# Patient Record
Sex: Female | Born: 1935 | Race: White | Hispanic: No | Smoking: Never smoker
Health system: Southern US, Community
[De-identification: ages and names within clinical notes are randomized; demographics above are authoritative.]

## PROBLEM LIST (undated history)

## (undated) DIAGNOSIS — I4891 Unspecified atrial fibrillation: Secondary | ICD-10-CM

## (undated) DIAGNOSIS — F419 Anxiety disorder, unspecified: Secondary | ICD-10-CM

## (undated) DIAGNOSIS — F32A Depression, unspecified: Secondary | ICD-10-CM

## (undated) DIAGNOSIS — I10 Essential (primary) hypertension: Secondary | ICD-10-CM

## (undated) DIAGNOSIS — C801 Malignant (primary) neoplasm, unspecified: Secondary | ICD-10-CM

## (undated) DIAGNOSIS — F329 Major depressive disorder, single episode, unspecified: Secondary | ICD-10-CM

## (undated) HISTORY — DX: Unspecified atrial fibrillation: I48.91

## (undated) HISTORY — PX: ABDOMINAL HYSTERECTOMY: SHX81

## (undated) HISTORY — PX: MITRAL VALVE REPLACEMENT: SHX147

## (undated) HISTORY — DX: Major depressive disorder, single episode, unspecified: F32.9

## (undated) HISTORY — DX: Depression, unspecified: F32.A

---

## 2004-11-12 ENCOUNTER — Ambulatory Visit: Payer: Self-pay | Admitting: Internal Medicine

## 2005-12-09 ENCOUNTER — Ambulatory Visit: Payer: Self-pay | Admitting: Internal Medicine

## 2006-12-17 ENCOUNTER — Ambulatory Visit: Payer: Self-pay | Admitting: Internal Medicine

## 2007-04-15 ENCOUNTER — Ambulatory Visit: Payer: Self-pay | Admitting: Internal Medicine

## 2007-07-27 ENCOUNTER — Ambulatory Visit: Payer: Self-pay

## 2007-12-20 ENCOUNTER — Ambulatory Visit: Payer: Self-pay | Admitting: Internal Medicine

## 2008-12-21 ENCOUNTER — Ambulatory Visit: Payer: Self-pay | Admitting: Internal Medicine

## 2009-09-17 ENCOUNTER — Emergency Department: Payer: Self-pay | Admitting: Emergency Medicine

## 2009-12-25 ENCOUNTER — Ambulatory Visit: Payer: Self-pay | Admitting: Internal Medicine

## 2010-08-16 ENCOUNTER — Ambulatory Visit: Payer: Self-pay | Admitting: Ophthalmology

## 2010-08-26 ENCOUNTER — Ambulatory Visit: Payer: Self-pay | Admitting: Ophthalmology

## 2010-10-04 ENCOUNTER — Ambulatory Visit: Payer: Self-pay | Admitting: Ophthalmology

## 2010-10-14 ENCOUNTER — Ambulatory Visit: Payer: Self-pay | Admitting: Ophthalmology

## 2010-12-31 ENCOUNTER — Ambulatory Visit: Payer: Self-pay | Admitting: Internal Medicine

## 2012-01-19 ENCOUNTER — Ambulatory Visit: Payer: Self-pay | Admitting: Internal Medicine

## 2013-01-19 ENCOUNTER — Ambulatory Visit: Payer: Self-pay | Admitting: Internal Medicine

## 2014-01-25 DIAGNOSIS — I4891 Unspecified atrial fibrillation: Secondary | ICD-10-CM | POA: Insufficient documentation

## 2014-01-31 ENCOUNTER — Ambulatory Visit: Payer: Self-pay | Admitting: Internal Medicine

## 2014-04-10 DIAGNOSIS — E785 Hyperlipidemia, unspecified: Secondary | ICD-10-CM | POA: Insufficient documentation

## 2014-04-10 DIAGNOSIS — I10 Essential (primary) hypertension: Secondary | ICD-10-CM | POA: Insufficient documentation

## 2014-04-10 DIAGNOSIS — Z952 Presence of prosthetic heart valve: Secondary | ICD-10-CM | POA: Insufficient documentation

## 2014-09-12 ENCOUNTER — Emergency Department: Payer: Self-pay | Admitting: Emergency Medicine

## 2014-09-12 LAB — COMPREHENSIVE METABOLIC PANEL
ALBUMIN: 3.8 g/dL (ref 3.4–5.0)
ANION GAP: 8 (ref 7–16)
Alkaline Phosphatase: 100 U/L
BUN: 16 mg/dL (ref 7–18)
Bilirubin,Total: 0.5 mg/dL (ref 0.2–1.0)
CALCIUM: 9.1 mg/dL (ref 8.5–10.1)
CHLORIDE: 105 mmol/L (ref 98–107)
Co2: 26 mmol/L (ref 21–32)
Creatinine: 1.28 mg/dL (ref 0.60–1.30)
EGFR (African American): 51 — ABNORMAL LOW
EGFR (Non-African Amer.): 42 — ABNORMAL LOW
Glucose: 108 mg/dL — ABNORMAL HIGH (ref 65–99)
Osmolality: 279 (ref 275–301)
Potassium: 4.2 mmol/L (ref 3.5–5.1)
SGOT(AST): 26 U/L (ref 15–37)
SGPT (ALT): 25 U/L
Sodium: 139 mmol/L (ref 136–145)
Total Protein: 7.5 g/dL (ref 6.4–8.2)

## 2014-09-12 LAB — CBC
HCT: 43.3 % (ref 35.0–47.0)
HGB: 14 g/dL (ref 12.0–16.0)
MCH: 31.7 pg (ref 26.0–34.0)
MCHC: 32.2 g/dL (ref 32.0–36.0)
MCV: 98 fL (ref 80–100)
Platelet: 257 10*3/uL (ref 150–440)
RBC: 4.4 10*6/uL (ref 3.80–5.20)
RDW: 13.7 % (ref 11.5–14.5)
WBC: 8.7 10*3/uL (ref 3.6–11.0)

## 2014-09-12 LAB — PROTIME-INR
INR: 4
Prothrombin Time: 37.7 secs — ABNORMAL HIGH (ref 11.5–14.7)

## 2014-10-01 ENCOUNTER — Emergency Department: Payer: Self-pay | Admitting: Internal Medicine

## 2014-10-01 LAB — CBC
HCT: 43.3 % (ref 35.0–47.0)
HGB: 13.9 g/dL (ref 12.0–16.0)
MCH: 31.7 pg (ref 26.0–34.0)
MCHC: 32.1 g/dL (ref 32.0–36.0)
MCV: 99 fL (ref 80–100)
Platelet: 253 10*3/uL (ref 150–440)
RBC: 4.38 10*6/uL (ref 3.80–5.20)
RDW: 14.6 % — ABNORMAL HIGH (ref 11.5–14.5)
WBC: 10.6 10*3/uL (ref 3.6–11.0)

## 2014-10-01 LAB — BASIC METABOLIC PANEL
Anion Gap: 8 (ref 7–16)
BUN: 16 mg/dL (ref 7–18)
CHLORIDE: 105 mmol/L (ref 98–107)
CO2: 27 mmol/L (ref 21–32)
CREATININE: 1.19 mg/dL (ref 0.60–1.30)
Calcium, Total: 9.3 mg/dL (ref 8.5–10.1)
EGFR (Non-African Amer.): 46 — ABNORMAL LOW
GFR CALC AF AMER: 56 — AB
Glucose: 115 mg/dL — ABNORMAL HIGH (ref 65–99)
Osmolality: 282 (ref 275–301)
Potassium: 4.5 mmol/L (ref 3.5–5.1)
SODIUM: 140 mmol/L (ref 136–145)

## 2014-10-01 LAB — TROPONIN I: Troponin-I: <0.02 ng/mL

## 2014-10-01 LAB — MAGNESIUM: MAGNESIUM: 2 mg/dL

## 2014-10-01 LAB — PROTIME-INR
INR: 3.9
PROTHROMBIN TIME: 36.9 s — AB (ref 11.5–14.7)

## 2014-10-08 ENCOUNTER — Inpatient Hospital Stay: Payer: Self-pay | Admitting: Obstetrics & Gynecology

## 2014-10-08 LAB — TROPONIN I: Troponin-I: <0.02 ng/mL

## 2014-10-08 LAB — COMPREHENSIVE METABOLIC PANEL
Albumin: 3.6 g/dL (ref 3.4–5.0)
Alkaline Phosphatase: 81 U/L
Anion Gap: 9 (ref 7–16)
BILIRUBIN TOTAL: 0.4 mg/dL (ref 0.2–1.0)
BUN: 15 mg/dL (ref 7–18)
CHLORIDE: 107 mmol/L (ref 98–107)
CO2: 25 mmol/L (ref 21–32)
CREATININE: 1.02 mg/dL (ref 0.60–1.30)
Calcium, Total: 8.8 mg/dL (ref 8.5–10.1)
GFR CALC NON AF AMER: 55 — AB
Glucose: 107 mg/dL — ABNORMAL HIGH (ref 65–99)
OSMOLALITY: 283 (ref 275–301)
Potassium: 4.1 mmol/L (ref 3.5–5.1)
SGOT(AST): 28 U/L (ref 15–37)
SGPT (ALT): 25 U/L
SODIUM: 141 mmol/L (ref 136–145)
Total Protein: 6.8 g/dL (ref 6.4–8.2)

## 2014-10-08 LAB — PROTIME-INR
INR: 2.3
INR: 2.6
Prothrombin Time: 25 secs — ABNORMAL HIGH (ref 11.5–14.7)
Prothrombin Time: 26.9 secs — ABNORMAL HIGH (ref 11.5–14.7)

## 2014-10-08 LAB — CBC
HCT: 40.1 % (ref 35.0–47.0)
HCT: 37.4 % (ref 35.0–47.0)
HGB: 12.7 g/dL (ref 12.0–16.0)
HGB: 12 g/dL (ref 12.0–16.0)
MCH: 31.3 pg (ref 26.0–34.0)
MCH: 31.5 pg (ref 26.0–34.0)
MCHC: 31.8 g/dL — AB (ref 32.0–36.0)
MCHC: 32.1 g/dL (ref 32.0–36.0)
MCV: 99 fL (ref 80–100)
MCV: 98 fL (ref 80–100)
PLATELETS: 233 10*3/uL (ref 150–440)
Platelet: 224 10*3/uL (ref 150–440)
RBC: 4.06 10*6/uL (ref 3.80–5.20)
RBC: 3.82 10*6/uL (ref 3.80–5.20)
RDW: 14.5 % (ref 11.5–14.5)
RDW: 14.4 % (ref 11.5–14.5)
WBC: 9 10*3/uL (ref 3.6–11.0)
WBC: 7.6 10*3/uL (ref 3.6–11.0)

## 2014-10-08 LAB — TSH: Thyroid Stimulating Horm: 0.754 u[IU]/mL

## 2014-10-09 LAB — COMPREHENSIVE METABOLIC PANEL
ALT: 19 U/L
Albumin: 3.1 g/dL — ABNORMAL LOW (ref 3.4–5.0)
Alkaline Phosphatase: 67 U/L
Anion Gap: 7 (ref 7–16)
BUN: 13 mg/dL (ref 7–18)
Bilirubin,Total: 0.5 mg/dL (ref 0.2–1.0)
CO2: 25 mmol/L (ref 21–32)
CREATININE: 0.93 mg/dL (ref 0.60–1.30)
Calcium, Total: 8.2 mg/dL — ABNORMAL LOW (ref 8.5–10.1)
Chloride: 109 mmol/L — ABNORMAL HIGH (ref 98–107)
EGFR (African American): >60
GLUCOSE: 180 mg/dL — AB (ref 65–99)
Osmolality: 286 (ref 275–301)
Potassium: 4.4 mmol/L (ref 3.5–5.1)
SGOT(AST): 28 U/L (ref 15–37)
Sodium: 141 mmol/L (ref 136–145)
Total Protein: 6.1 g/dL — ABNORMAL LOW (ref 6.4–8.2)

## 2014-10-09 LAB — CBC WITH DIFFERENTIAL/PLATELET
BASOS PCT: 0.2 %
Basophil #: 0 10*3/uL (ref 0.0–0.1)
EOS ABS: 0 10*3/uL (ref 0.0–0.7)
Eosinophil %: 0 %
HCT: 36.7 % (ref 35.0–47.0)
HGB: 12.1 g/dL (ref 12.0–16.0)
Lymphocyte #: 0.3 10*3/uL — ABNORMAL LOW (ref 1.0–3.6)
Lymphocyte %: 3.5 %
MCH: 32.3 pg (ref 26.0–34.0)
MCHC: 32.9 g/dL (ref 32.0–36.0)
MCV: 98 fL (ref 80–100)
Monocyte #: 0.1 x10 3/mm — ABNORMAL LOW (ref 0.2–0.9)
Monocyte %: 0.7 %
NEUTROS PCT: 95.6 %
Neutrophil #: 9.4 10*3/uL — ABNORMAL HIGH (ref 1.4–6.5)
PLATELETS: 225 10*3/uL (ref 150–440)
RBC: 3.73 10*6/uL — ABNORMAL LOW (ref 3.80–5.20)
RDW: 14.1 % (ref 11.5–14.5)
WBC: 9.9 10*3/uL (ref 3.6–11.0)

## 2014-10-09 LAB — PROTIME-INR
INR: 1.7
Prothrombin Time: 20 secs — ABNORMAL HIGH (ref 11.5–14.7)

## 2014-10-10 LAB — BASIC METABOLIC PANEL
Anion Gap: 6 — ABNORMAL LOW (ref 7–16)
BUN: 15 mg/dL (ref 7–18)
CO2: 26 mmol/L (ref 21–32)
Calcium, Total: 8.1 mg/dL — ABNORMAL LOW (ref 8.5–10.1)
Chloride: 113 mmol/L — ABNORMAL HIGH (ref 98–107)
Creatinine: 0.82 mg/dL (ref 0.60–1.30)
EGFR (Non-African Amer.): >60
Glucose: 100 mg/dL — ABNORMAL HIGH (ref 65–99)
Osmolality: 290 (ref 275–301)
Potassium: 3.9 mmol/L (ref 3.5–5.1)
SODIUM: 145 mmol/L (ref 136–145)

## 2014-10-10 LAB — CBC WITH DIFFERENTIAL/PLATELET
Basophil #: 0 10*3/uL (ref 0.0–0.1)
Basophil %: 0.3 %
Eosinophil #: 0 10*3/uL (ref 0.0–0.7)
Eosinophil %: 0.1 %
HCT: 32 % — AB (ref 35.0–47.0)
HGB: 10.5 g/dL — ABNORMAL LOW (ref 12.0–16.0)
LYMPHS ABS: 0.8 10*3/uL — AB (ref 1.0–3.6)
Lymphocyte %: 6.3 %
MCH: 32.2 pg (ref 26.0–34.0)
MCHC: 32.8 g/dL (ref 32.0–36.0)
MCV: 98 fL (ref 80–100)
MONOS PCT: 5.6 %
Monocyte #: 0.8 x10 3/mm (ref 0.2–0.9)
NEUTROS ABS: 11.8 10*3/uL — AB (ref 1.4–6.5)
Neutrophil %: 87.7 %
Platelet: 185 10*3/uL (ref 150–440)
RBC: 3.25 10*6/uL — ABNORMAL LOW (ref 3.80–5.20)
RDW: 14.4 % (ref 11.5–14.5)
WBC: 13.4 10*3/uL — ABNORMAL HIGH (ref 3.6–11.0)

## 2014-10-10 LAB — PROTIME-INR
INR: 1.3
Prothrombin Time: 16.3 secs — ABNORMAL HIGH (ref 11.5–14.7)

## 2014-10-11 ENCOUNTER — Ambulatory Visit: Payer: Self-pay

## 2014-10-13 DIAGNOSIS — C541 Malignant neoplasm of endometrium: Secondary | ICD-10-CM | POA: Insufficient documentation

## 2014-10-17 DIAGNOSIS — R768 Other specified abnormal immunological findings in serum: Secondary | ICD-10-CM | POA: Insufficient documentation

## 2014-10-30 ENCOUNTER — Ambulatory Visit: Payer: Self-pay | Admitting: Obstetrics and Gynecology

## 2014-11-28 ENCOUNTER — Ambulatory Visit
Admit: 2014-11-28 | Disposition: A | Discharge status: Home / Self Care | Payer: Self-pay | Attending: Obstetrics and Gynecology | Admitting: Obstetrics and Gynecology

## 2014-11-28 ENCOUNTER — Ambulatory Visit
Admit: 2014-11-28 | Disposition: A | Discharge status: Home / Self Care | Payer: Self-pay | Attending: Oncology | Admitting: Oncology

## 2014-12-29 ENCOUNTER — Ambulatory Visit
Admit: 2014-12-29 | Disposition: A | Discharge status: Home / Self Care | Payer: Self-pay | Attending: Oncology | Admitting: Oncology

## 2014-12-29 ENCOUNTER — Ambulatory Visit
Admit: 2014-12-29 | Disposition: A | Discharge status: Home / Self Care | Payer: Self-pay | Attending: Obstetrics and Gynecology | Admitting: Obstetrics and Gynecology

## 2015-01-22 LAB — SURGICAL PATHOLOGY

## 2015-01-28 NOTE — Consult Note (Signed)
Reason for Visit: This 81 year old Female patient presents to the clinic for initial evaluation of  endometrial cancer .   Referred by Dr. Fransisca Connors.  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  Pathology Report pathology report reviewed   Imaging Report CT scan of abdomen and pelvis and chest reviewed   Referral Report clinical notes reviewed   Planned Treatment Regimen whole pelvis radiation plus brachytherapy to vaginal cuff   HPI   patient is an elderly 81 year old female who presented to the emergency room with vaginal bleeding was found to have an endometrial mass protruding from cervical os. Patient has a history of A. fib with mitral valve replacement in 1997 currently on Lovenox. She started postmenopausal bleeding 5 years prior. She was seen by OB/GYN and had endometrial biopsy showing FIGO grade 2 endometrial carcinoma. CT scan showed uterine mass no evidence of metastatic disease or significant pelvic lymphadenopathy. She was taken to surgery at [facility] underwent TAH/BSO for a FIGO grade 24.7 x 4.6 x 2.3 cm endometrioid adenocarcinoma. Tumor had greater than 50% myometrial involvement 1.3 cm out of a total of 1.7 and wall thickness. Lymphovascular invasion was absent. Cervix was positive for cervical stromal invasion. Bilateral ovaries and fallopian tubes were negative.peritoneal cytology was also negative. Patient has done well postoperatively. She is now about a week out from surgery. She specifically denies any GI or GU complaints. She is seen today for radiation oncology opinion.  Past Hx:    endometrial cancer:    Atrial Fibrillation:    mitral valve replacement:   Past, Family and Social History:  Past Medical History positive    Cardiovascular atrial fibrillation; mitral valve replacement   Family History positive   Family History Comments brother with colon cancer and brother with brain cancer   Social History noncontributory   Additional Past Medical and Surgical History accompanied by son today   Allergies:   Contrast dye: Other  Benadryl: Other  Home Meds:  Home Medications: Medication Instructions Status  enoxaparin 60 milligram(s) subcutaneous every 12 hours Active  acetaminophen 325 mg oral tablet 2 tab(s) orally every 4 hours, As needed, mild pain (1-3/10) or temp. greater than 100.4 Active  temazepam 15 mg oral capsule 1 cap(s) orally once a day (at bedtime), As needed, -sleep/insomnia Active  megestrol 40 mg oral tablet 2 tab(s) orally 2 times a day Active  diltiazem 60 mg oral tablet 2 tab(s) orally once a day, As Needed for increased heart rate Active  diltiazem 300 mg/24 hours oral capsule, extended release 1 cap(s) orally once a day Active  Lotensin 20 mg oral tablet 1 tab(s) orally once a day Active  simvastatin 20 mg oral tablet 1 tab(s) orally once a day (at bedtime) Active  furosemide 20 mg oral tablet 1 tab(s) orally once a day Active  ALPRAZolam 0.5 mg oral tablet 1 tab(s) orally 3 times a day, As Needed - for Anxiety, Nervousness Active   Review of Systems:  General negative   Performance Status (ECOG) 0   Skin negative   Breast negative   Ophthalmologic negative   ENMT negative   Respiratory and Thorax negative   Cardiovascular see HPI   Gastrointestinal negative   Genitourinary see HPI   Musculoskeletal negative   Neurological negative   Psychiatric  negative   Hematology/Lymphatics negative   Endocrine negative   Allergic/Immunologic negative   Review of Systems   denies any weight loss, fatigue, weakness, fever, chills or night sweats. Patient denies any loss of vision, blurred vision. Patient denies any ringing  of the ears or hearing loss. No irregular  heartbeat. Patient denies heart murmur or history of fainting. Patient denies any chest pain or pain radiating to her upper extremities. Patient denies any shortness of breath, difficulty breathing at night, cough or hemoptysis. Patient denies any swelling in the lower legs. Patient denies any nausea vomiting, vomiting of blood, or coffee ground material in the vomitus. Patient denies any stomach pain. Patient states has had normal bowel movements no significant constipation or diarrhea. Patient denies any dysuria, hematuria or significant nocturia. Patient denies any problems walking, swelling in the joints or loss of balance. Patient denies any skin changes, loss of hair or loss of weight. Patient denies any excessive worrying or anxiety or significant depression. Patient denies any problems with insomnia. Patient denies excessive thirst, polyuria, polydipsia. Patient denies any swollen glands, patient denies easy bruising or easy bleeding. Patient denies any recent infections, allergies or URI. Patient "s visual fields have not changed significantly in recent time.   Nursing Notes:  Nursing Vital Signs and Chemo Nursing Nursing Notes: *CC Vital Signs Flowsheet:   28-Jan-16 09:35  Temp Temperature 97.1  Pulse Pulse 53  Respirations Respirations 18  SBP SBP 123  DBP DBP 64  Pain Scale (0-10)  0  Current Weight (kg) (kg) 61.4   Physical Exam:  General/Skin/HEENT:  General normal   Skin normal   Eyes normal   ENMT normal   Head and Neck normal   Additional PE well-developed elderly female in NAD Kartik examination shows irregular irregular heartbeat. Lungs are clear to A&P. Abdomen is benign. On speculum examination vaginal vault is healing well. Bimanual examination shows no evidence of mass or nodularity in the parametrial region rectal exam is unremarkable.   Breasts/Resp/CV/GI/GU:  Respiratory and Thorax normal   Cardiovascular normal   Gastrointestinal normal   Genitourinary  normal   MS/Neuro/Psych/Lymph:  Musculoskeletal normal   Neurological normal   Lymphatics normal   Other Results:  Radiology Results: LabUnknown:    11-Jan-16 09:08, CT Chest, Abd, and Pelvis With Contrast  PACS Image   CT:  CT Chest, Abd, and Pelvis With Contrast   REASON FOR EXAM:    (1) cancer staging; (2) endometrial cancer/staging  COMMENTS:   LMP: 40 yrs ago    PROCEDURE: CT  - CT CHEST ABDOMEN AND PELVIS W  - Oct 09 2014  9:08AM     CLINICAL DATA:  Endometrial cancer for cancer staging. Recurrent  vaginalbleeding. Anticoagulation. Atrial fibrillation.    EXAM:  CT CHEST, ABDOMEN, AND PELVIS WITH CONTRAST    TECHNIQUE:  Multidetector CT imaging of the chest, abdomen and pelvis was  performed following the standard protocol during bolus  administrationof intravenous contrast.  CONTRAST:  100 cc Omnipaque 300    COMPARISON:  None.    FINDINGS:  CT CHEST FINDINGS    Aortic arch, branch vessel, and Coronary atherosclerosis. Dense  mitral calcification. Moderate aortic valve calcification. Mild  cardiomegaly. Mild biapical pleural parenchymal scarring with  dependent subsegmental atelectasis in both lungs. Faint subpleural  nodularity in the right lower lobe including a head 3 mm thick  pleural-based nodule on image 32 series 6.    0.4 by 0.3 cm subpleural nodule in the left upper  lobe along the  major fissure.    No pathologic thoracic adenopathy.    Sclerotic left fifth rib anterolateral lesion, image 27 series 2,  probably a healing rib fracture.    CT ABDOMEN AND PELVIS FINDINGS    Hepatobiliary: Small dependent gallstones in the 3-4 mm range.  Mildly distended gallbladder.    Pancreas: Unremarkable    Spleen: Unremarkable  Adrenals/Urinary Tract: Unremarkable    Stomach/Bowel: Abnormal a thick appendix up to 1.1 cm. Sigmoid colon  diverticulosis.    Vascular/Lymphatic: Aortoiliac atherosclerotic vascular disease. No  pathologic adenopathy  identified.    Reproductive: Unusual short and mildly heterogeneous appearance of  the uterus may reflect the patient's underlying endometrial  carcinoma.    Other: No supplemental non-categorized findings.    Musculoskeletal: Considerable lumbar spondylosis and degenerative  disc disease, resulting in left foraminal stenosis at L5-S1.     IMPRESSION:  1. The uterus appears short and somewhatheterogeneous. The latter  may reflect the underlying endometrial cancer. No adenopathy or  specific findings extra uterine spread.  2. Considerable atherosclerosis with dense mitral valve  calcification and moderate aortic valve calcification, as well as  mild cardiomegaly.  3. Sclerotic lesion in the left fifth rib is probably a healing  subacute fracture, this would be unlikely to be a solitary osseous  metastatic lesion.  4. Mild scarring and minimal subpleural nodularity in the lungs,  likely benign.  5. Cholelithiasis.  6. Abnormally thickened appendix. Given the lack of local tenderness  I doubt that this is from appendicitis. Appendiceal mucocele not  completely excluded.  7. Lumbar spondylosis and degenerative disc disease, with left  foraminal stenosis at L5-S 1.  8. Sigmoid colon diverticulosis.      Electronically Signed    By: Sherryl Barters M.D.    On: 10/09/2014 09:31         Verified By: Carron Curie, M.D.,   Relevent Results:   Relevant Scans and Labs CT scan is reviewed   Assessment and Plan: Impression:   clinical stage II endometrioid adenocarcinoma of the endometrium with high intermediate stage based on poor prognostic factors of greater than 50% myometrial invasion, and cervical stromal invasion with incomplete staging based on no lymph nodes being examined. For adjuvant whole pelvic radiation and vaginal brachytherapy. Plan:   at this time based on incomplete surgical staging with no lymph node dissection which was probably not done based on the  patient's history of anticoagulation therapy for her mitral valve standard of care recommendation is for whole pelvic radiation to 4500 cGyplus a vaginal cuff boost of 1200 cGy in 3 fractions using high dose rate remote afterloading. We'll have discussed the case with medical oncology and will referred patient to medical oncology for opinion although chemotherapy would not be indicated. We'll also discuss the case with GYN oncology. I will allow the patient to heal several more weeks and have set up CT simulation in about 2 weeks' time for her whole pelvis. Risks and benefits of treatment including increased chance of diarrhea dysuria fatigue alteration of blood counts vaginal stenosis all were explained in detail to both the patient and her son. They both seem to comprehend my treatment plan well.  I would like to take this opportunity for allowing me to participate in the care of your patient..  Fax to Physician:  Physicians To Recieve Fax: Mellody Drown, MD - [phone] [name], MD - [phone].  Electronic Signatures: Armstead Peaks (MD)  (Signed 28-Jan-16 10:42)  Authored: HPI, Diagnosis, Past Hx, PFSH, Allergies, Home Meds, ROS, Nursing Notes, Physical Exam, Other Results, Relevent Results, Encounter Assessment and Plan, Fax to Physician   Last Updated: 28-Jan-16 10:42 by Armstead Peaks (MD)

## 2015-01-28 NOTE — Consult Note (Signed)
PATIENT NAME:  Crystal Landry, MERRICK MR#:  [id number] DATE OF BIRTH:  11-23-1926  DATE OF CONSULTATION:  07/09/2015  CONSULTING PHYSICIAN:  Isaias Cowman, MD  PRIMARY CARE PHYSICIAN: [name], MD.   CARDIOLOGIST: Bartholome Bill, MD.  CHIEF COMPLAINT: Vaginal bleeding.   REASON FOR CONSULTATION: Consultation requested for evaluation of atrial fibrillation, mitral valve replacement in the setting of vaginal bleeding on warfarin.   HISTORY OF PRESENT ILLNESS: The patient is an 81 year old female with history of mitral valve replacement and persistent atrial fibrillation. The patient has been on warfarin with recent history of recurrent vaginal bleeding. The patient returns to the emergency room with a presumptive diagnosis of endometrial cancer. The patient has been relatively asymptomatic in terms of her atrial fibrillation. She denies chest pain or shortness of breath. The patient is typically very active, does her own cleaning and cooking without difficulty.   PAST MEDICAL HISTORY: 1. Status post mechanical mitral valve replacement in 1995.  2. Persistent atrial fibrillation.  3. Hypertension.  4. Hyperlipidemia.   MEDICATIONS: Warfarin, diltiazem CD 300 mg daily, diltiazem 60 mg b.i.d. p.r.n. for tachycardia, benazepril 20 mg daily, furosemide 20 mg daily, simvastatin 20 mg at bedtime, alprazolam 25 mg b.i.d. p.r.n., temazepam 15 mg at bedtime p.r.n.   SOCIAL HISTORY: The patient currently lives alone. She never smoked.   FAMILY HISTORY: Mother with history of myocardial infarction.   REVIEW OF SYSTEMS:  CONSTITUTIONAL: No fever or chills.   EYES: No blurry vision.   EARS: No hearing loss.   RESPIRATORY: The patient denies shortness of breath.   CARDIOVASCULAR: The patient denies chest pain. She does have occasional palpitations due to atrial fibrillation   GASTROINTESTINAL: No nausea, vomiting or diarrhea.   GENITOURINARY: No dysuria or hematuria.   ENDOCRINE: No  polyuria or polydipsia.   MUSCULOSKELETAL: No arthralgias or myalgias.   NEUROLOGICAL: No focal muscle weakness or numbness.   PSYCHOLOGICAL: No depression or anxiety.   PHYSICAL EXAMINATION: VITAL SIGNS: Blood pressure 125/70, heart rate was 90 to 100, irregularly regular.   HEENT: Pupils are reactive to light and accommodation.   NECK: Supple without thyromegaly.   LUNGS: Clear.   HEART: Normal JVP. Normal PMI. Irregularly irregular rhythm. Crisp mitral valve prosthetic sound with grade 1 to 2/6 mid-peaking systolic murmur.   ABDOMEN: Soft and nontender.   EXTREMITIES: Pulses were intact bilaterally.   MUSCULOSKELETAL: Normal muscle tone.   NEUROLOGIC: The patient is alert and oriented x 3. Motor and sensory both grossly intact.   IMPRESSION: An 81 year old female with history of mitral valve replacement with mechanical mitral valve, persistent atrial fibrillation on chronic warfarin therapy with INR 2.3 with profuse vaginal bleeding, which is recurrent. The patient otherwise asymptomatic. After a lengthy discussion with Dr. Leonides Schanz, we agreed to proceed with reversal warfarin with vitamin K and anticoagulation coverage with Lovenox 1 mg/kg subcutaneous every 12. The patient is scheduled to have CT scan tomorrow.   RECOMMENDATIONS: 1. Agree with overall current therapy.  2. Continue Cardizem currently as prescribed for rate control.  3. Continue to hold warfarin.  4. Lovenox 1 mg/kg subcutaneous every 12.  5. Further recommendations pending CT scan results and patient's initial clinical course.   ____________________________ Isaias Cowman, MD ap:TT D: 10/08/2014 18:31:47 ET T: 10/08/2014 19:49:06 ET JOB#: [id number]  cc: Isaias Cowman, MD, <Dictator> Isaias Cowman MD ELECTRONICALLY SIGNED 10/18/2014 18:06

## 2015-02-21 ENCOUNTER — Encounter (INDEPENDENT_AMBULATORY_CARE_PROVIDER_SITE_OTHER): Payer: Self-pay

## 2015-02-21 ENCOUNTER — Ambulatory Visit
Admission: RE | Admit: 2015-02-21 | Discharge: 2015-02-21 | Disposition: A | Discharge status: Home / Self Care | Payer: Medicare Other | Source: Ambulatory Visit | Attending: Radiation Oncology | Admitting: Radiation Oncology

## 2015-02-21 ENCOUNTER — Encounter: Payer: Self-pay | Admitting: Radiation Oncology

## 2015-02-21 VITALS — BP 142/74 | Wt 133.2 lb

## 2015-02-21 DIAGNOSIS — C541 Malignant neoplasm of endometrium: Secondary | ICD-10-CM

## 2015-02-21 HISTORY — DX: Essential (primary) hypertension: I10

## 2015-02-21 HISTORY — DX: Unspecified atrial fibrillation: I48.91

## 2015-02-21 HISTORY — DX: Anxiety disorder, unspecified: F41.9

## 2015-02-21 HISTORY — DX: Malignant (primary) neoplasm, unspecified: C80.1

## 2015-02-21 NOTE — Progress Notes (Signed)
Radiation Oncology Follow up Note  Name: Crystal Landry   Date:   02/21/2015 MRN:  [id number] DOB: 01-16-1927    This 81 y.o. female presents to the clinic today fopatient is an elderly 81 year old female who presented to the emergency room with vaginal bleeding was found to have an endometrial mass protruding from cervical os. Patient has a history of A. fib with mitral valve replacement in 1997 currently on Lovenox. She started postmenopausal bleeding 5 years prior. She was seen by OB/GYN and had endometrial biopsy showing FIGO grade 2 endometrial carcinoma. CT scan showed uterine mass no evidence of metastatic disease or significant pelvic lymphadenopathy. She was taken to surgery at [facility] underwent TAH/BSO for a FIGO grade 24.7 x 4.6 x 2.3 cm endometrioid adenocarcinoma. Tumor had greater than 50% myometrial involvement 1.3 cm out of a total of 1.7 and wall thickness. Lymphovascular invasion was absent. Cervix was positive for cervical stromal invasion. Bilateral ovaries and fallopian tubes were negative.peritoneal cytology was also negative. Patient has done well postoperatively.   REFERRING PROVIDER: No ref. provider found  HPI: She is now one month out of both combined modality treatment with both external beam treatment for whole pelvis as well as back vaginal brachytherapy with high dose rate remote afterloading to her vaginal apex. She is doing well. She specifically denies diarrhea dysuria or any other GI/GU complaints. She's having no vaginal discharge..  COMPLICATIONS OF TREATMENT: none  FOLLOW UP COMPLIANCE: keeps appointments   PHYSICAL EXAM:  BP 142/74 mmHg  Wt 133 lb 2.5 oz (60.4 kg) Well-developed female in NAD. On speculum examination vaginal vault is well-healed. Bimanual examination shows no evidence of vaginal nodularity or mass. Bimanual examination also shows no evidence of parametrial mass. Rectal exam is normal. Well-developed well-nourished patient in NAD. HEENT  reveals PERLA, EOMI, discs not visualized.  Oral cavity is clear. No oral mucosal lesions are identified. Neck is clear without evidence of cervical or supraclavicular adenopathy. Lungs are clear to A&P. Cardiac examination is essentially unremarkable with regular rate and rhythm without murmur rub or thrill. Abdomen is benign with no organomegaly or masses noted. Motor sensory and DTR levels are equal and symmetric in the upper and lower extremities. Cranial nerves II through XII are grossly intact. Proprioception is intact. No peripheral adenopathy or edema is identified. No motor or sensory levels are noted. Crude visual fields are within normal range.   RADIOLOGY RESULTS: No following imaging studies aren't seen for my review  PLAN: At the present time she continues to do well 1 month out from both external beam treatment as well as vaginal brachytherapy. I am please were overall progress. I've asked to see her back in 4-5 months for follow-up. She knows to call sooner with any concerns. She continues close follow-up care with oncology and GYN clinics.  I would like to take this opportunity for allowing me to participate in the care of your patient.Armstead Peaks., MD

## 2015-03-22 ENCOUNTER — Other Ambulatory Visit: Payer: Self-pay | Admitting: Internal Medicine

## 2015-03-22 DIAGNOSIS — Z1231 Encounter for screening mammogram for malignant neoplasm of breast: Secondary | ICD-10-CM

## 2015-03-27 ENCOUNTER — Ambulatory Visit
Admission: RE | Admit: 2015-03-27 | Discharge: 2015-03-27 | Disposition: A | Discharge status: Home / Self Care | Payer: Medicare Other | Source: Ambulatory Visit | Attending: Internal Medicine | Admitting: Internal Medicine

## 2015-03-27 DIAGNOSIS — Z1231 Encounter for screening mammogram for malignant neoplasm of breast: Secondary | ICD-10-CM | POA: Diagnosis present

## 2015-05-25 ENCOUNTER — Other Ambulatory Visit: Payer: Self-pay | Admitting: *Deleted

## 2015-05-25 DIAGNOSIS — C541 Malignant neoplasm of endometrium: Secondary | ICD-10-CM

## 2015-05-31 ENCOUNTER — Encounter: Payer: Self-pay | Admitting: Oncology

## 2015-05-31 ENCOUNTER — Inpatient Hospital Stay (HOSPITAL_BASED_OUTPATIENT_CLINIC_OR_DEPARTMENT_OTHER): Payer: Medicare Other | Admitting: Oncology

## 2015-05-31 ENCOUNTER — Inpatient Hospital Stay: Payer: Medicare Other | Attending: Oncology

## 2015-05-31 VITALS — BP 120/72 | HR 91 | Temp 97.3°F | Wt 132.1 lb

## 2015-05-31 DIAGNOSIS — Z8589 Personal history of malignant neoplasm of other organs and systems: Secondary | ICD-10-CM | POA: Insufficient documentation

## 2015-05-31 DIAGNOSIS — Z7901 Long term (current) use of anticoagulants: Secondary | ICD-10-CM

## 2015-05-31 DIAGNOSIS — F419 Anxiety disorder, unspecified: Secondary | ICD-10-CM | POA: Diagnosis not present

## 2015-05-31 DIAGNOSIS — Z79899 Other long term (current) drug therapy: Secondary | ICD-10-CM | POA: Diagnosis not present

## 2015-05-31 DIAGNOSIS — I4891 Unspecified atrial fibrillation: Secondary | ICD-10-CM | POA: Insufficient documentation

## 2015-05-31 DIAGNOSIS — I10 Essential (primary) hypertension: Secondary | ICD-10-CM

## 2015-05-31 DIAGNOSIS — R531 Weakness: Secondary | ICD-10-CM

## 2015-05-31 DIAGNOSIS — Z923 Personal history of irradiation: Secondary | ICD-10-CM

## 2015-05-31 DIAGNOSIS — R5383 Other fatigue: Secondary | ICD-10-CM | POA: Diagnosis not present

## 2015-05-31 DIAGNOSIS — C541 Malignant neoplasm of endometrium: Secondary | ICD-10-CM

## 2015-05-31 LAB — CBC WITH DIFFERENTIAL/PLATELET
BASOS PCT: 0 %
Basophils Absolute: 0 10*3/uL (ref 0–0.1)
EOS ABS: 0.1 10*3/uL (ref 0–0.7)
EOS PCT: 2 %
HCT: 43.3 % (ref 35.0–47.0)
Hemoglobin: 14.2 g/dL (ref 12.0–16.0)
LYMPHS ABS: 0.6 10*3/uL — AB (ref 1.0–3.6)
Lymphocytes Relative: 10 %
MCH: 33.1 pg (ref 26.0–34.0)
MCHC: 32.9 g/dL (ref 32.0–36.0)
MCV: 100.9 fL — ABNORMAL HIGH (ref 80.0–100.0)
MONOS PCT: 11 %
Monocytes Absolute: 0.7 10*3/uL (ref 0.2–0.9)
Neutro Abs: 4.7 10*3/uL (ref 1.4–6.5)
Neutrophils Relative %: 77 %
PLATELETS: 209 10*3/uL (ref 150–440)
RBC: 4.29 MIL/uL (ref 3.80–5.20)
RDW: 15.5 % — ABNORMAL HIGH (ref 11.5–14.5)
WBC: 6.2 10*3/uL (ref 3.6–11.0)

## 2015-05-31 LAB — COMPREHENSIVE METABOLIC PANEL
ALT: 14 U/L (ref 14–54)
ANION GAP: 8 (ref 5–15)
AST: 32 U/L (ref 15–41)
Albumin: 4.4 g/dL (ref 3.5–5.0)
Alkaline Phosphatase: 66 U/L (ref 38–126)
BUN: 17 mg/dL (ref 6–20)
CHLORIDE: 103 mmol/L (ref 101–111)
CO2: 29 mmol/L (ref 22–32)
Calcium: 9.8 mg/dL (ref 8.9–10.3)
Creatinine, Ser: 0.99 mg/dL (ref 0.44–1.00)
GFR calc non Af Amer: 51 mL/min — ABNORMAL LOW (ref >60)
GFR, EST AFRICAN AMERICAN: 59 mL/min — AB (ref >60)
Glucose, Bld: 98 mg/dL (ref 65–99)
POTASSIUM: 4.2 mmol/L (ref 3.5–5.1)
Sodium: 140 mmol/L (ref 135–145)
Total Bilirubin: 0.9 mg/dL (ref 0.3–1.2)
Total Protein: 7.1 g/dL (ref 6.5–8.1)

## 2015-05-31 MED ORDER — CITALOPRAM HYDROBROMIDE 10 MG PO TABS
10.0000 mg | ORAL_TABLET | Freq: Every day | ORAL | Status: DC
Start: 2015-05-31 — End: 2017-03-04

## 2015-05-31 NOTE — Progress Notes (Signed)
Patient does have living will.  Never smoked.  Just getting over shingles.  States she has no energy.

## 2015-06-01 LAB — CA 125: CA 125: 19.5 U/mL (ref 0.0–38.1)

## 2015-06-04 NOTE — Progress Notes (Signed)
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 SLNs could not be mapped Status post resection and radiation therapy    INTERVAL HISTORY:  81 year old lady feeling somewhat weak and tired.  No specific complaints.  Almost 6 months or more since patient has finished adjuvant radiation therapy for stage II carcinoma of uterus.  No rectal bleeding.  No nausea no vomiting no diarrhea appetite has been poor REVIEW OF SYSTEMS:    general status: Patient is feeling weak and tired.  No change in a performance status.  No chills.  No fever. HEENT:   No evidence of stomatitis Lungs: No cough or shortness of breath Cardiac: No chest pain or paroxysmal nocturnal dyspnea GI: No nausea no vomiting no diarrhea no abdominal pain Skin: No rash Lower extremity no swelling Neurological system: No tingling.  No numbness.  No other focal signs Musculoskeletal system no bony pains  As per HPI. Otherwise, a complete review of systems is negatve.  PAST MEDICAL HISTORY: Past Medical History  Diagnosis Date  . Atrial fibrillation   . Anxiety   . Hypertension   . A-fib   . Cancer     endometrial    PAST SURGICAL HISTORY: Past Surgical History  Procedure Laterality Date  . Mitral valve replacement      FAMILY HISTORY  Family History positive, brother with colon cancer, brother with brain cancer   ADVANCED DIRECTIVES:  Patient does have advance healthcare directive, Patient   does not desire to make any changes HEALTH MAINTENANCE: Social History  Substance Use Topics  . Smoking status: Never Smoker   . Smokeless tobacco: Never Used  . Alcohol Use: None      Allergies    Allergen Reactions  . Codeine Sulfate     Other reaction(s): Unknown  . Diphenhydramine Other (See Comments)    skin became red all over  . Ivp Dye [Iodinated Diagnostic Agents] Other (See Comments)    Skin becomes red all over  . Quinidine     Other reaction(s): Unknown Other reaction(s): Unknown    Current Outpatient Prescriptions  Medication Sig Dispense Refill  . acetaminophen (TYLENOL) 325 MG tablet Take 650 mg by mouth every 4 (four) hours as needed for mild pain (or temp greater than 100.4).    Marland Kitchen ALPRAZolam (XANAX) 0.5 MG tablet Take 0.5 mg by mouth 3 (three) times daily as needed for anxiety.    . Ascorbic Acid (VITAMIN C) 1000 MG tablet Take by mouth.    . benazepril (LOTENSIN) 20 MG tablet Take 20 mg by mouth daily.    Marland Kitchen diltiazem (CARDIZEM) 60 MG tablet Take 120 mg by mouth daily as needed (for increased heart rate).    Marland Kitchen diltiazem (TIAZAC) 300 MG 24 hr capsule Take 300 mg by mouth daily.    . furosemide (LASIX) 20 MG tablet Take 20 mg by mouth daily.    Marland Kitchen gabapentin (NEURONTIN) 100 MG capsule Take by mouth.    Marland Kitchen glucosamine-chondroitin 500-400 MG tablet Take 1 tablet by mouth 3 (three) times daily.    . Multiple Vitamin (MULTI-VITAMINS) TABS Take by mouth.    . simvastatin (ZOCOR) 20 MG tablet Take 20 mg by mouth at bedtime.    Marland Kitchen  temazepam (RESTORIL) 15 MG capsule Take 15 mg by mouth at bedtime as needed for sleep.    Marland Kitchen warfarin (COUMADIN) 6 MG tablet Take 5 mg by mouth daily at 6 PM.     . citalopram (CELEXA) 10 MG tablet Take 1 tablet (10 mg total) by mouth at bedtime. 30 tablet 5   No current facility-administered medications for this visit.    OBJECTIVE:  Filed Vitals:   05/31/15 1144  BP: 120/72  Pulse: 91  Temp: 97.3 F (36.3 C)     There is no height on file to calculate BMI.    ECOG FS:1 - Symptomatic but completely ambulatory  PHYSICAL EXAM: Goal status: Performance status is good.  Patient has not lost significant weight HEENT: No evidence of  stomatitis. Sclera and conjunctivae :: No jaundice.   pale looking. Lungs: Air  entry equal on both sides.  No rhonchi.  No rales.  Cardiac: Heart sounds are normal.  No pericardial rub.  No murmur. Lymphatic system: Cervical, axillary, inguinal, lymph nodes not palpable GI: Abdomen is soft.  No ascites.  Liver spleen not palpable.  No tenderness.  Bowel sounds are within normal limit Lower extremity: No edema Neurological system: Higher functions, cranial nerves intact no evidence of peripheral neuropathy. Skin: No rash.  No ecchymosis.Marland Kitchen   LAB RESULTS:  CBC Latest Ref Rng 05/31/2015 10/10/2014  WBC 3.6 - 11.0 K/uL 6.2 13.4(H)  Hemoglobin 12.0 - 16.0 g/dL 14.2 10.5(L)  Hematocrit 35.0 - 47.0 % 43.3 32.0(L)  Platelets 150 - 440 K/uL 209 185    Appointment on 05/31/2015  Component Date Value Ref Range Status  . WBC 05/31/2015 6.2  3.6 - 11.0 K/uL Final  . RBC 05/31/2015 4.29  3.80 - 5.20 MIL/uL Final  . Hemoglobin 05/31/2015 14.2  12.0 - 16.0 g/dL Final  . HCT 05/31/2015 43.3  35.0 - 47.0 % Final  . MCV 05/31/2015 100.9* 80.0 - 100.0 fL Final  . MCH 05/31/2015 33.1  26.0 - 34.0 pg Final  . MCHC 05/31/2015 32.9  32.0 - 36.0 g/dL Final  . RDW 05/31/2015 15.5* 11.5 - 14.5 % Final  . Platelets 05/31/2015 209  150 - 440 K/uL Final  . Neutrophils Relative % 05/31/2015 77   Final  . Neutro Abs 05/31/2015 4.7  1.4 - 6.5 K/uL Final  . Lymphocytes Relative 05/31/2015 10   Final  . Lymphs Abs 05/31/2015 0.6* 1.0 - 3.6 K/uL Final  . Monocytes Relative 05/31/2015 11   Final  . Monocytes Absolute 05/31/2015 0.7  0.2 - 0.9 K/uL Final  . Eosinophils Relative 05/31/2015 2   Final  . Eosinophils Absolute 05/31/2015 0.1  0 - 0.7 K/uL Final  . Basophils Relative 05/31/2015 0   Final  . Basophils Absolute 05/31/2015 0.0  0 - 0.1 K/uL Final  . Sodium 05/31/2015 140  135 - 145 mmol/L Final  . Potassium 05/31/2015 4.2  3.5 - 5.1 mmol/L Final  . Chloride 05/31/2015 103  101 - 111 mmol/L Final  . CO2  05/31/2015 29  22 - 32 mmol/L Final  . Glucose, Bld 05/31/2015 98  65 - 99 mg/dL Final  . BUN 05/31/2015 17  6 - 20 mg/dL Final  . Creatinine, Ser 05/31/2015 0.99  0.44 - 1.00 mg/dL Final  . Calcium 05/31/2015 9.8  8.9 - 10.3 mg/dL Final  . Total Protein 05/31/2015 7.1  6.5 - 8.1 g/dL Final  . Albumin 05/31/2015 4.4  3.5 - 5.0 g/dL Final  . AST 05/31/2015  32  15 - 41 U/L Final  . ALT 05/31/2015 14  14 - 54 U/L Final  . Alkaline Phosphatase 05/31/2015 66  38 - 126 U/L Final  . Total Bilirubin 05/31/2015 0.9  0.3 - 1.2 mg/dL Final  . GFR calc non Af Amer 05/31/2015 51* >60 mL/min Final  . GFR calc Af Amer 05/31/2015 59* >60 mL/min Final   Comment: (NOTE) The eGFR has been calculated using the CKD EPI equation. This calculation has not been validated in all clinical situations. eGFR's persistently <60 mL/min signify possible Chronic Kidney Disease.   . Anion gap 05/31/2015 8  5 - 15 Final  . CA 125 05/31/2015 19.5  0.0 - 38.1 U/mL Final   Comment: (NOTE) Roche ECLIA methodology Performed At: [facility] [place], [place] [id number] Lindon Romp MD QU:[id number]       ASSESSMENT: Carcinoma of uterus there is no evidence of recurrent or progressive disease CA 125 is stable and within acceptable range Hemoglobin has improved to 14 g We reevaluated in one year in between and will be evaluated by GYN oncologist and gradually care will be transferred to her GYN doctor Dr. Richrd Humbles    Patient expressed understanding and was in agreement with this plan. She also understands that She can call clinic at any time with any questions, concerns, or complaints.    No matching staging information was found for the patient.  Forest Gleason, MD   06/04/2015 4:33 PM

## 2015-06-05 IMAGING — CT CT CHEST-ABD-PELV W/ CM
2 of 6 series · 15 of 46 positions shown, 17 images · IV contrast (omnipaque)
Comparison: None.

CLINICAL DATA: Endometrial cancer for cancer staging. Recurrent
vaginal bleeding. Anticoagulation. Atrial fibrillation.

EXAM:
CT CHEST, ABDOMEN, AND PELVIS WITH CONTRAST
TECHNIQUE: Multidetector CT imaging of the chest, abdomen and pelvis was
performed following the standard protocol during bolus
administration of intravenous contrast.
CONTRAST:  100 cc Omnipaque 300

[Series 2: cap with · axial · 0.73mm/px · z∈[-669,-129]mm · 12 of 124 slices shown, 14 images]
[im 8/124  soft-tissue]
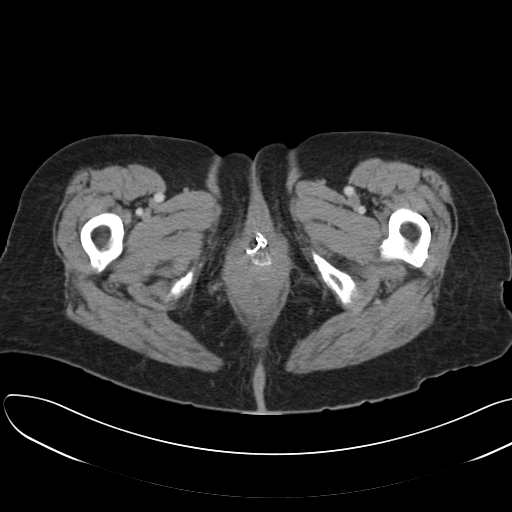
[im 8/124  bone]
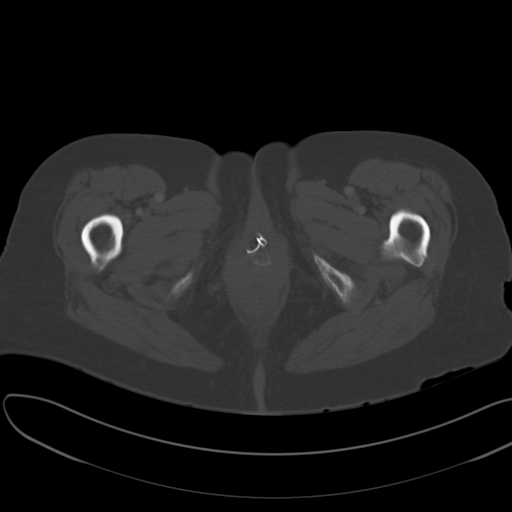
[im 22/124  soft-tissue]
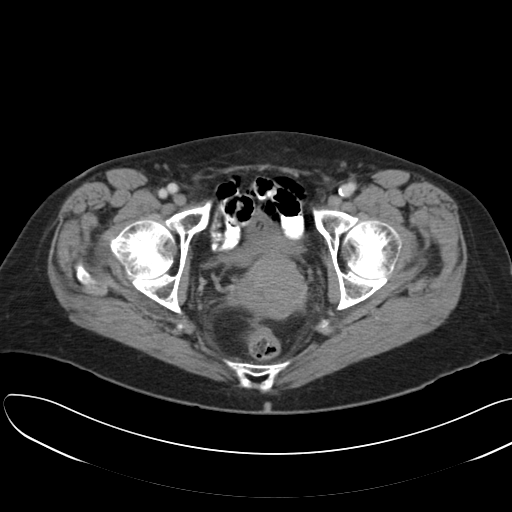
[im 29/124  soft-tissue]
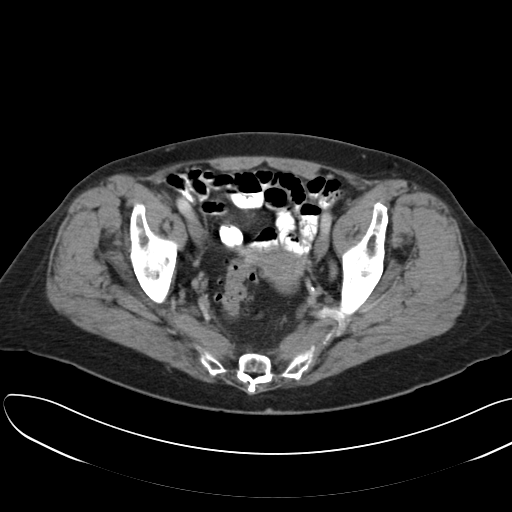
[im 37/124  soft-tissue]
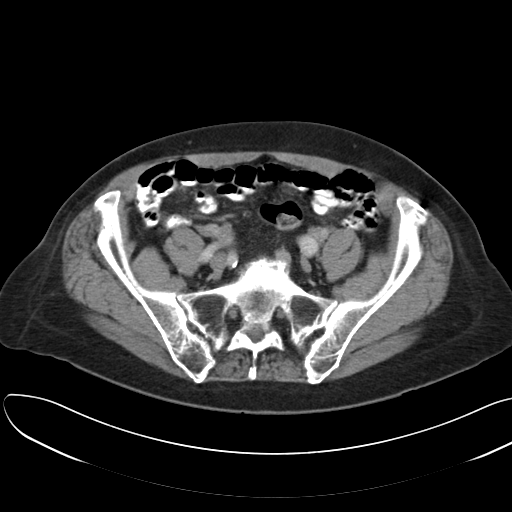
[im 51/124  soft-tissue]
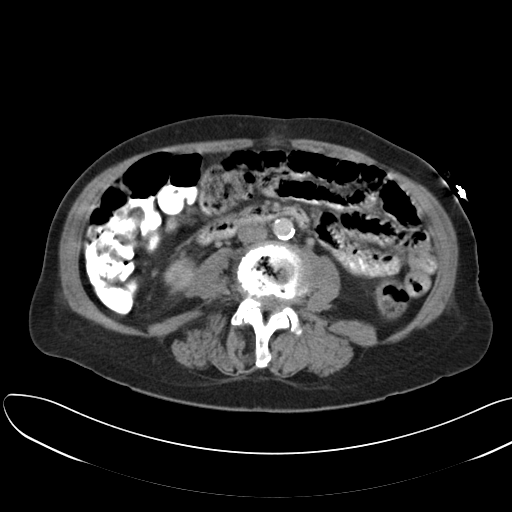
[im 58/124  soft-tissue]
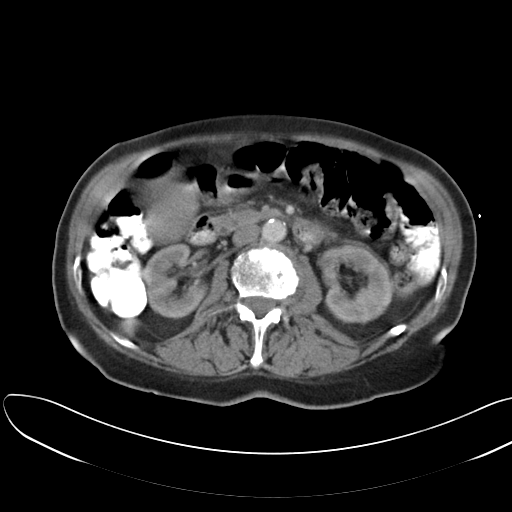
[im 66/124  soft-tissue]
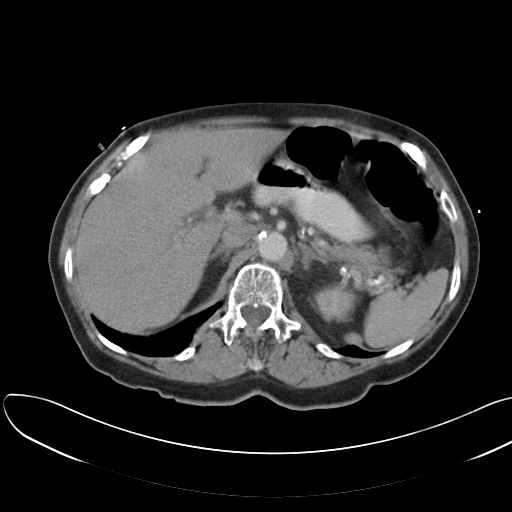
[im 80/124  soft-tissue]
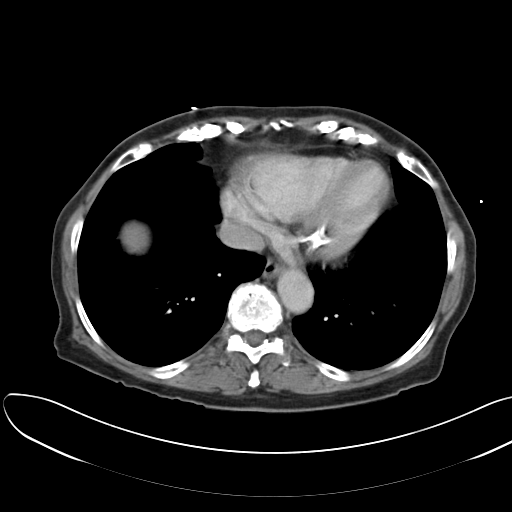
[im 87/124  soft-tissue]
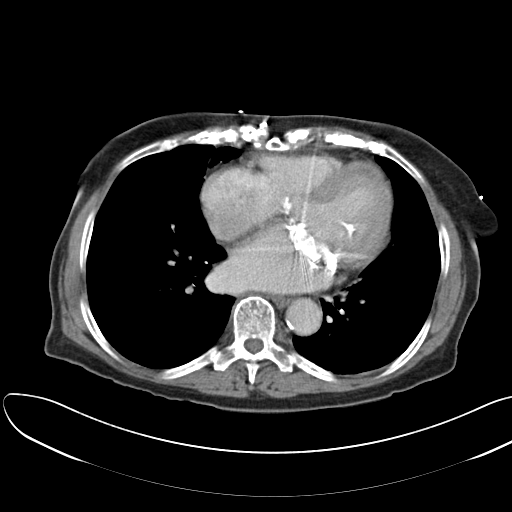
[im 87/124  bone]
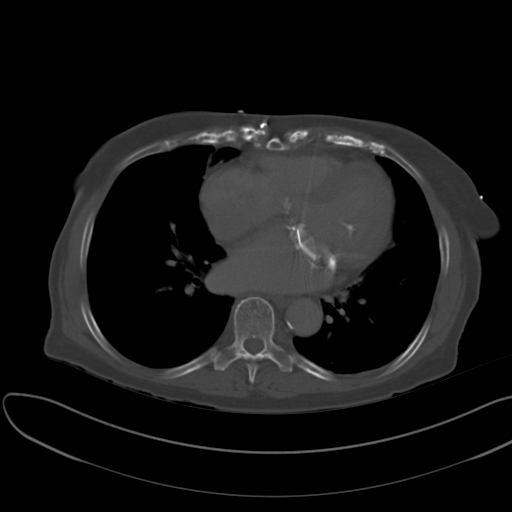
[im 95/124  soft-tissue]
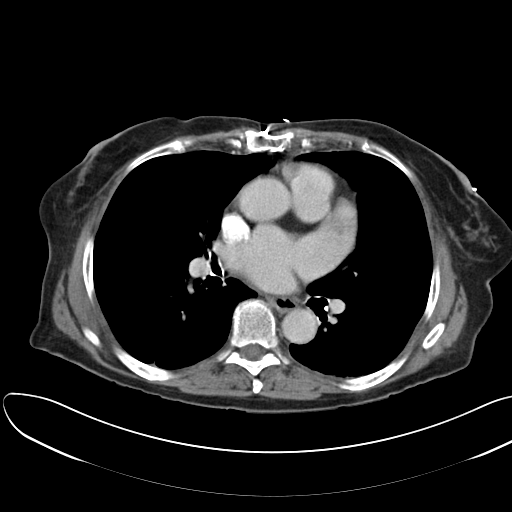
[im 109/124  soft-tissue]
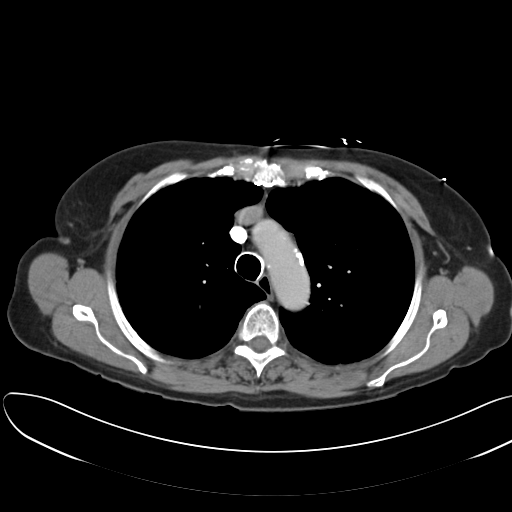
[im 116/124  soft-tissue]
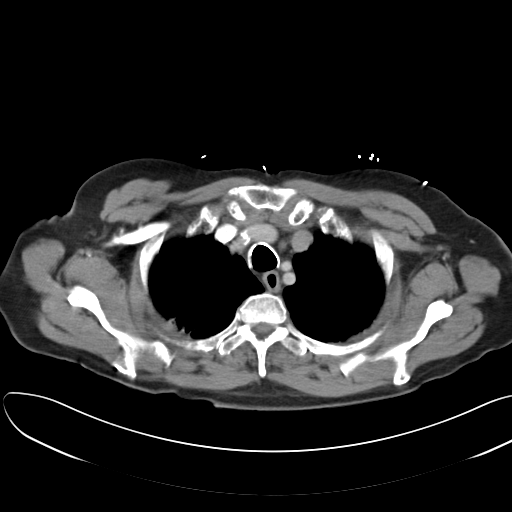

[Series 7: cor cap with · coronal · 0.81mm/px · 3 of 109 slices shown]
[im 37/109  soft-tissue]
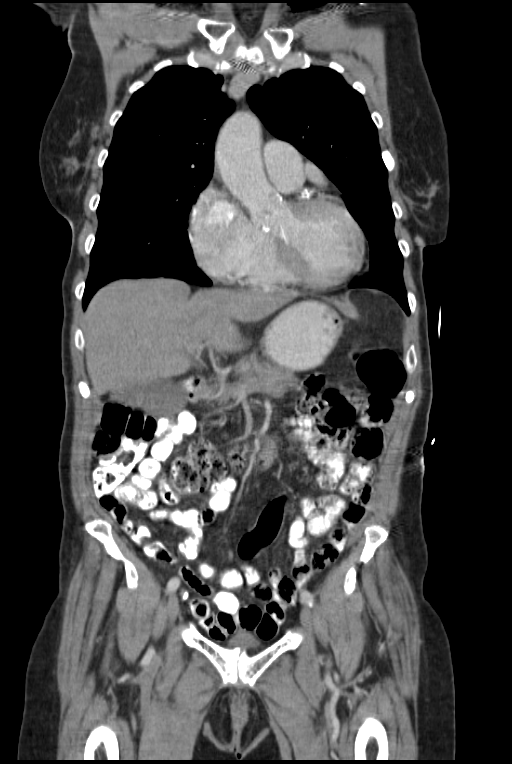
[im 49/109  soft-tissue]
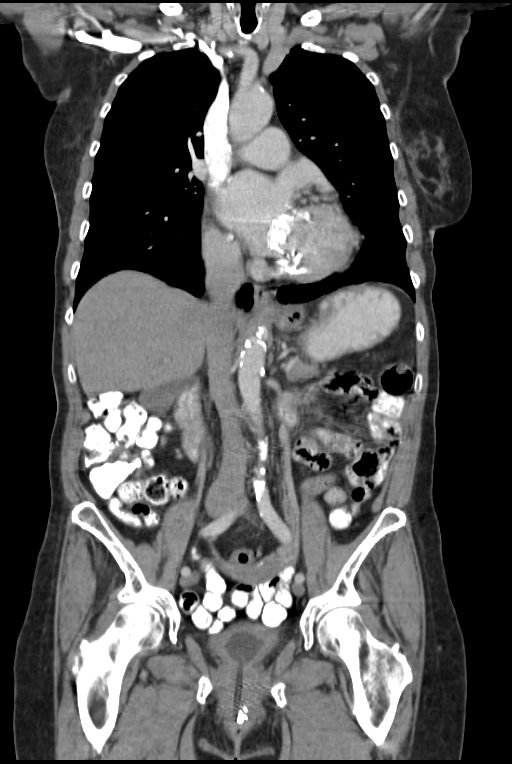
[im 61/109  soft-tissue]
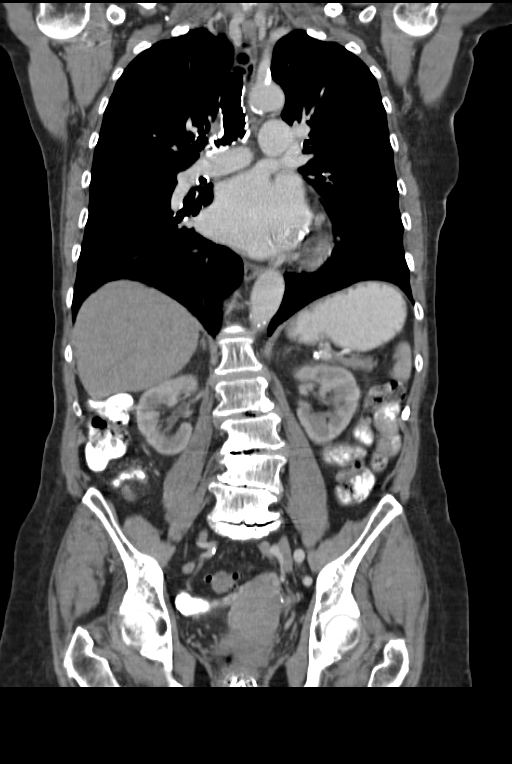

[15 of 46 positions shown; findings below may reference images not displayed]

FINDINGS: CT CHEST FINDINGS

Aortic arch, branch vessel, and Coronary atherosclerosis. Dense
mitral calcification. Moderate aortic valve calcification. Mild
cardiomegaly. Mild biapical pleural parenchymal scarring with
dependent subsegmental atelectasis in both lungs. Faint subpleural
nodularity in the right lower lobe including a head 3 mm thick
pleural-based nodule on image 32 series [DATE] by 0.3 cm subpleural nodule in the left upper lobe along the
major fissure.

No pathologic thoracic adenopathy.

Sclerotic left fifth rib anterolateral lesion, image 27 series 2,
probably a healing rib fracture.

CT ABDOMEN AND PELVIS FINDINGS

Hepatobiliary: Small dependent gallstones in the 3-4 mm range.
Mildly distended gallbladder.

Pancreas: Unremarkable

Spleen: Unremarkable

Adrenals/Urinary Tract: Unremarkable

Stomach/Bowel: Abnormal a thick appendix up to 1.1 cm. Sigmoid colon
diverticulosis.

Vascular/Lymphatic: Aortoiliac atherosclerotic vascular disease. No
pathologic adenopathy identified.

Reproductive: Unusual short and mildly heterogeneous appearance of
the uterus may reflect the patient's underlying endometrial
carcinoma.

Other: No supplemental non-categorized findings.

Musculoskeletal: Considerable lumbar spondylosis and degenerative
disc disease, resulting in left foraminal stenosis at L5-S1.
IMPRESSION: 1. The uterus appears short and somewhat heterogeneous. The latter
may reflect the underlying endometrial cancer. No adenopathy or
specific findings extra uterine spread.
2. Considerable atherosclerosis with dense mitral valve
calcification and moderate aortic valve calcification, as well as
mild cardiomegaly.
3. Sclerotic lesion in the left fifth rib is probably a healing
subacute fracture, this would be unlikely to be a solitary osseous
metastatic lesion.
4. Mild scarring and minimal subpleural nodularity in the lungs,
likely benign.
5. Cholelithiasis.
6. Abnormally thickened appendix. Given the lack of local tenderness
I doubt that this is from appendicitis. Appendiceal mucocele not
completely excluded.
7. Lumbar spondylosis and degenerative disc disease, with left
foraminal stenosis at L5-S 1.
8. Sigmoid colon diverticulosis.

## 2015-08-29 ENCOUNTER — Ambulatory Visit
Admission: RE | Admit: 2015-08-29 | Discharge: 2015-08-29 | Disposition: A | Discharge status: Home / Self Care | Payer: Medicare Other | Source: Ambulatory Visit | Attending: Radiation Oncology | Admitting: Radiation Oncology

## 2015-08-29 ENCOUNTER — Encounter: Payer: Self-pay | Admitting: Radiation Oncology

## 2015-08-29 VITALS — BP 142/72 | Temp 96.2°F | Resp 20 | Ht 62.0 in | Wt 130.6 lb

## 2015-08-29 DIAGNOSIS — C541 Malignant neoplasm of endometrium: Secondary | ICD-10-CM

## 2015-08-29 NOTE — Progress Notes (Signed)
Radiation Oncology Follow up Note  Name: Crystal Landry   Date:   08/29/2015 MRN:  [id number] DOB: 1927/07/18    This 81 y.o. female presents to the clinic today for follow-up for endometrial carcinoma. Stage II status post resection and adjuvant radiation therapy  REFERRING PROVIDER: [name], MD  HPI: Patient is an 81 year old female now out 7 months having completed whole pelvic radiation as well as vaginal brachytherapy for endometrial carcinoma FIGO grade 2 with greater than 50% myometrial involvement and lymphovascular invasion present as well as cervix positive for cervical stromal involvement.. She is now seen out 7 months and is doing well. She specifically denies diarrhea dysuria or any GI GU complaints. She's having no vaginal bleeding.  COMPLICATIONS OF TREATMENT: none  FOLLOW UP COMPLIANCE: keeps appointments   PHYSICAL EXAM:  BP 142/72 mmHg  Temp(Src) 96.2 F (35.7 C)  Resp 20  Ht 5\' 2"  (1.575 m)  Wt 130 lb 10 oz (59.25 kg)  BMI 23.89 kg/m2 A well-developed elderly female in NAD on speculum examination vaginal vault is clear without evidence of mass or nodularity. Bimanual examination shows no evidence of abnormality in the parametrial region rectal exam is unremarkable. Well-developed well-nourished patient in NAD. HEENT reveals PERLA, EOMI, discs not visualized.  Oral cavity is clear. No oral mucosal lesions are identified. Neck is clear without evidence of cervical or supraclavicular adenopathy. Lungs are clear to A&P. Cardiac examination is essentially unremarkable with regular rate and rhythm without murmur rub or thrill. Abdomen is benign with no organomegaly or masses noted. Motor sensory and DTR levels are equal and symmetric in the upper and lower extremities. Cranial nerves II through XII are grossly intact. Proprioception is intact. No peripheral adenopathy or edema is identified. No motor or sensory levels are noted. Crude visual fields are within  normal range.  RADIOLOGY RESULTS: Mammogram performed in June 2016 showed no evidence of disease.  PLAN: Present time she continues to do well without significant side effect or complaint. I'm please were overall progress. I've asked to see her back in 1 year for follow-up. Patient knows to call sooner with any concerns.  I would like to take this opportunity for allowing me to participate in the care of your patient.Armstead Peaks., MD

## 2015-10-04 DIAGNOSIS — F411 Generalized anxiety disorder: Secondary | ICD-10-CM | POA: Insufficient documentation

## 2015-10-04 DIAGNOSIS — R5383 Other fatigue: Secondary | ICD-10-CM | POA: Insufficient documentation

## 2015-11-28 ENCOUNTER — Encounter: Payer: Self-pay | Admitting: Oncology

## 2015-11-28 ENCOUNTER — Ambulatory Visit
Admission: RE | Admit: 2015-11-28 | Discharge: 2015-11-28 | Disposition: A | Discharge status: Home / Self Care | Payer: Medicare Other | Source: Ambulatory Visit | Attending: Oncology | Admitting: Oncology

## 2015-11-28 ENCOUNTER — Ambulatory Visit
Admission: RE | Admit: 2015-11-28 | Discharge: 2015-11-28 | Disposition: A | Discharge status: Home / Self Care | Payer: Medicare Other | Source: Ambulatory Visit | Attending: Obstetrics and Gynecology | Admitting: Obstetrics and Gynecology

## 2015-11-28 ENCOUNTER — Inpatient Hospital Stay (HOSPITAL_BASED_OUTPATIENT_CLINIC_OR_DEPARTMENT_OTHER): Payer: Medicare Other | Admitting: Oncology

## 2015-11-28 ENCOUNTER — Inpatient Hospital Stay: Payer: Medicare Other | Attending: Oncology

## 2015-11-28 VITALS — BP 122/93 | HR 67 | Temp 96.8°F | Resp 18 | Wt 132.9 lb

## 2015-11-28 DIAGNOSIS — Z79899 Other long term (current) drug therapy: Secondary | ICD-10-CM | POA: Diagnosis not present

## 2015-11-28 DIAGNOSIS — Z808 Family history of malignant neoplasm of other organs or systems: Secondary | ICD-10-CM | POA: Insufficient documentation

## 2015-11-28 DIAGNOSIS — I4891 Unspecified atrial fibrillation: Secondary | ICD-10-CM

## 2015-11-28 DIAGNOSIS — R5383 Other fatigue: Secondary | ICD-10-CM | POA: Diagnosis not present

## 2015-11-28 DIAGNOSIS — R0602 Shortness of breath: Secondary | ICD-10-CM | POA: Diagnosis present

## 2015-11-28 DIAGNOSIS — R531 Weakness: Secondary | ICD-10-CM

## 2015-11-28 DIAGNOSIS — Z800 Family history of malignant neoplasm of digestive organs: Secondary | ICD-10-CM | POA: Diagnosis not present

## 2015-11-28 DIAGNOSIS — C541 Malignant neoplasm of endometrium: Secondary | ICD-10-CM

## 2015-11-28 DIAGNOSIS — I517 Cardiomegaly: Secondary | ICD-10-CM | POA: Insufficient documentation

## 2015-11-28 DIAGNOSIS — I10 Essential (primary) hypertension: Secondary | ICD-10-CM | POA: Diagnosis not present

## 2015-11-28 DIAGNOSIS — Z7901 Long term (current) use of anticoagulants: Secondary | ICD-10-CM | POA: Insufficient documentation

## 2015-11-28 DIAGNOSIS — Z8542 Personal history of malignant neoplasm of other parts of uterus: Secondary | ICD-10-CM | POA: Insufficient documentation

## 2015-11-28 DIAGNOSIS — F419 Anxiety disorder, unspecified: Secondary | ICD-10-CM

## 2015-11-28 LAB — CBC WITH DIFFERENTIAL/PLATELET
Basophils Absolute: 0 10*3/uL (ref 0–0.1)
Basophils Relative: 1 %
EOS ABS: 0.1 10*3/uL (ref 0–0.7)
EOS PCT: 2 %
HCT: 40.7 % (ref 35.0–47.0)
Hemoglobin: 13.4 g/dL (ref 12.0–16.0)
LYMPHS ABS: 0.5 10*3/uL — AB (ref 1.0–3.6)
LYMPHS PCT: 7 %
MCH: 32.5 pg (ref 26.0–34.0)
MCHC: 33 g/dL (ref 32.0–36.0)
MCV: 98.5 fL (ref 80.0–100.0)
MONO ABS: 0.6 10*3/uL (ref 0.2–0.9)
MONOS PCT: 9 %
Neutro Abs: 5.9 10*3/uL (ref 1.4–6.5)
Neutrophils Relative %: 81 %
PLATELETS: 209 10*3/uL (ref 150–440)
RBC: 4.13 MIL/uL (ref 3.80–5.20)
RDW: 14.3 % (ref 11.5–14.5)
WBC: 7.2 10*3/uL (ref 3.6–11.0)

## 2015-11-28 LAB — COMPREHENSIVE METABOLIC PANEL
ALT: 15 U/L (ref 14–54)
ANION GAP: 6 (ref 5–15)
AST: 23 U/L (ref 15–41)
Albumin: 4.1 g/dL (ref 3.5–5.0)
Alkaline Phosphatase: 72 U/L (ref 38–126)
BUN: 21 mg/dL — ABNORMAL HIGH (ref 6–20)
CHLORIDE: 103 mmol/L (ref 101–111)
CO2: 27 mmol/L (ref 22–32)
CREATININE: 0.92 mg/dL (ref 0.44–1.00)
Calcium: 8.7 mg/dL — ABNORMAL LOW (ref 8.9–10.3)
GFR, EST NON AFRICAN AMERICAN: 55 mL/min — AB (ref >60)
Glucose, Bld: 106 mg/dL — ABNORMAL HIGH (ref 65–99)
Potassium: 4.1 mmol/L (ref 3.5–5.1)
SODIUM: 136 mmol/L (ref 135–145)
Total Bilirubin: 0.7 mg/dL (ref 0.3–1.2)
Total Protein: 7 g/dL (ref 6.5–8.1)

## 2015-11-28 NOTE — Progress Notes (Signed)
Patient here for CXR results.

## 2015-11-28 NOTE — Progress Notes (Signed)
[place] @ [facility] Telephone:(336) [phone]  Fax:(336) [place]: 25-Aug-1927  MR#: [id number]  RWE#:[id number]  Patient Care Team: [name], MD as PCP - General (Internal Medicine)  CHIEF COMPLAINT:  82 year old with stage II grade 2 endometrioid endometrial cancer with gross cervical involvement and deep myometrial invasion. SLNs could not be mapped Status post resection and radiation therapy  Patient is here for further follow-up and treatment consideration. The patient has been evaluated by cardiologist because of progressive cardiomegaly.  An primary care physician for regular mammogram which was done in June 2016 No abdominal pain.  No vaginal bleeding.  Appetite has been fairly good.    REVIEW OF SYSTEMS:    general status: Patient is feeling weak and tired.  No change in a performance status.  No chills.  No fever. HEENT:   No evidence of stomatitis Lungs: No cough or shortness of breath Cardiac: No chest pain or paroxysmal nocturnal dyspnea GI: No nausea no vomiting no diarrhea no abdominal pain Skin: No rash Lower extremity no swelling Neurological system: No tingling.  No numbness.  No other focal signs Musculoskeletal system no bony pains  As per HPI. Otherwise, a complete review of systems is negatve.  PAST MEDICAL HISTORY: Past Medical History  Diagnosis Date  . Atrial fibrillation ([place])   . Anxiety   . Hypertension   . A-fib ([place])   . Cancer [facility])     endometrial    PAST SURGICAL HISTORY: Past Surgical History  Procedure Laterality Date  . Mitral valve replacement      FAMILY HISTORY  Family History positive, brother with colon cancer, brother with brain cancer   ADVANCED DIRECTIVES:  Patient does have advance healthcare directive, Patient   does not desire to make any changes HEALTH MAINTENANCE: Social History  Substance Use Topics  . Smoking status: Never Smoker   . Smokeless tobacco: Never Used  . Alcohol  Use: Not on file      Allergies  Allergen Reactions  . Codeine Sulfate     Other reaction(s): Unknown  . Diphenhydramine Other (See Comments)    skin became red all over  . Ivp Dye [Iodinated Diagnostic Agents] Other (See Comments)    Skin becomes red all over  . Quinidine     Other reaction(s): Unknown Other reaction(s): Unknown    Current Outpatient Prescriptions  Medication Sig Dispense Refill  . acetaminophen (TYLENOL) 325 MG tablet Take 650 mg by mouth every 4 (four) hours as needed for mild pain (or temp greater than 100.4).    Marland Kitchen ALPRAZolam (XANAX) 0.5 MG tablet Take 0.5 mg by mouth 3 (three) times daily as needed for anxiety.    . Ascorbic Acid (VITAMIN C) 1000 MG tablet Take by mouth.    . benazepril (LOTENSIN) 20 MG tablet Take 20 mg by mouth daily.    . citalopram (CELEXA) 10 MG tablet Take 1 tablet (10 mg total) by mouth at bedtime. 30 tablet 5  . diltiazem (CARDIZEM) 60 MG tablet Take 120 mg by mouth daily as needed (for increased heart rate).    Marland Kitchen diltiazem (TIAZAC) 300 MG 24 hr capsule Take 300 mg by mouth daily.    . furosemide (LASIX) 20 MG tablet Take 20 mg by mouth daily.    Marland Kitchen gabapentin (NEURONTIN) 100 MG capsule Take by mouth.    Marland Kitchen glucosamine-chondroitin 500-400 MG tablet Take 1 tablet by mouth 3 (three) times daily.    Marland Kitchen  Multiple Vitamin (MULTI-VITAMINS) TABS Take by mouth.    . simvastatin (ZOCOR) 20 MG tablet Take 20 mg by mouth at bedtime.    . temazepam (RESTORIL) 15 MG capsule Take 15 mg by mouth at bedtime as needed for sleep.    Marland Kitchen warfarin (COUMADIN) 6 MG tablet Take 5 mg by mouth daily at 6 PM.      No current facility-administered medications for this visit.    OBJECTIVE:  Filed Vitals:   11/28/15 1033  BP: 122/93  Pulse: 67  Temp: 96.8 F (36 C)  Resp: 18     Body mass index is 24.31 kg/(m^2).    ECOG FS:1 - Symptomatic but completely ambulatory  PHYSICAL EXAM: Goal status: Performance status is good.  Patient has not lost significant  weight HEENT: No evidence of stomatitis. Sclera and conjunctivae :: No jaundice.   pale looking. Lungs: Air  entry equal on both sides.  No rhonchi.  No rales.  Cardiac: Heart sounds are normal.  No pericardial rub.  No murmur. Lymphatic system: Cervical, axillary, inguinal, lymph nodes not palpable GI: Abdomen is soft.  No ascites.  Liver spleen not palpable.  No tenderness.  Bowel sounds are within normal limit Lower extremity: No edema Neurological system: Higher functions, cranial nerves intact no evidence of peripheral neuropathy. Skin: No rash.  No ecchymosis.Marland Kitchen   LAB RESULTS:  CBC Latest Ref Rng 11/28/2015 05/31/2015  WBC 3.6 - 11.0 K/uL 7.2 6.2  Hemoglobin 12.0 - 16.0 g/dL 13.4 14.2  Hematocrit 35.0 - 47.0 % 40.7 43.3  Platelets 150 - 440 K/uL 209 209    Appointment on 11/28/2015  Component Date Value Ref Range Status  . WBC 11/28/2015 7.2  3.6 - 11.0 K/uL Final  . RBC 11/28/2015 4.13  3.80 - 5.20 MIL/uL Final  . Hemoglobin 11/28/2015 13.4  12.0 - 16.0 g/dL Final  . HCT 11/28/2015 40.7  35.0 - 47.0 % Final  . MCV 11/28/2015 98.5  80.0 - 100.0 fL Final  . MCH 11/28/2015 32.5  26.0 - 34.0 pg Final  . MCHC 11/28/2015 33.0  32.0 - 36.0 g/dL Final  . RDW 11/28/2015 14.3  11.5 - 14.5 % Final  . Platelets 11/28/2015 209  150 - 440 K/uL Final  . Neutrophils Relative % 11/28/2015 81   Final  . Neutro Abs 11/28/2015 5.9  1.4 - 6.5 K/uL Final  . Lymphocytes Relative 11/28/2015 7   Final  . Lymphs Abs 11/28/2015 0.5* 1.0 - 3.6 K/uL Final  . Monocytes Relative 11/28/2015 9   Final  . Monocytes Absolute 11/28/2015 0.6  0.2 - 0.9 K/uL Final  . Eosinophils Relative 11/28/2015 2   Final  . Eosinophils Absolute 11/28/2015 0.1  0 - 0.7 K/uL Final  . Basophils Relative 11/28/2015 1   Final  . Basophils Absolute 11/28/2015 0.0  0 - 0.1 K/uL Final  . Sodium 11/28/2015 136  135 - 145 mmol/L Final  . Potassium 11/28/2015 4.1  3.5 - 5.1 mmol/L Final  . Chloride 11/28/2015 103  101 - 111 mmol/L  Final  . CO2 11/28/2015 27  22 - 32 mmol/L Final  . Glucose, Bld 11/28/2015 106* 65 - 99 mg/dL Final  . BUN 11/28/2015 21* 6 - 20 mg/dL Final  . Creatinine, Ser 11/28/2015 0.92  0.44 - 1.00 mg/dL Final  . Calcium 11/28/2015 8.7* 8.9 - 10.3 mg/dL Final  . Total Protein 11/28/2015 7.0  6.5 - 8.1 g/dL Final  . Albumin 11/28/2015 4.1  3.5 - 5.0  g/dL Final  . AST 11/28/2015 23  15 - 41 U/L Final  . ALT 11/28/2015 15  14 - 54 U/L Final  . Alkaline Phosphatase 11/28/2015 72  38 - 126 U/L Final  . Total Bilirubin 11/28/2015 0.7  0.3 - 1.2 mg/dL Final  . GFR calc non Af Amer 11/28/2015 55* >60 mL/min Final  . GFR calc Af Amer 11/28/2015 >60  >60 mL/min Final   Comment: (NOTE) The eGFR has been calculated using the CKD EPI equation. This calculation has not been validated in all clinical situations. eGFR's persistently <60 mL/min signify possible Chronic Kidney Disease.   . Anion gap 11/28/2015 6  5 - 15 Final      ASSESSMENT: Carcinoma of uterus there is no evidence of recurrent or progressive disease Chest x-ray has been reported to be negative for any metastases. The patient will be seen by Dr. Georgia Dom on a regular basis.  I will discharge the patient from my care to be followed by primary care physician and cardiologist in GYN oncologist.  CA-125 is pending.  Chest x-ray has been reviewed.  All of the lab data has been reviewed.  We will monitor her CA-125 which has been ordered this time and if it is abnormal further evaluation would be planned otherwise patient would be followed by other physicians. He says that with the patient's is in agreement with that     Patient expressed understanding and was in agreement with this plan. She also understands that She can call clinic at any time with any questions, concerns, or complaints.    No matching staging information was found for the patient.  Forest Gleason, MD   11/28/2015 10:37 AM

## 2015-11-29 LAB — CA 125: CA 125: 16 U/mL (ref 0.0–38.1)

## 2016-03-05 ENCOUNTER — Inpatient Hospital Stay: Payer: Medicare Other | Attending: Obstetrics and Gynecology | Admitting: Obstetrics and Gynecology

## 2016-03-05 ENCOUNTER — Encounter: Payer: Self-pay | Admitting: Obstetrics and Gynecology

## 2016-03-05 VITALS — BP 143/84 | HR 93 | Temp 98.9°F | Ht 62.0 in | Wt 134.9 lb

## 2016-03-05 DIAGNOSIS — I4891 Unspecified atrial fibrillation: Secondary | ICD-10-CM | POA: Insufficient documentation

## 2016-03-05 DIAGNOSIS — F411 Generalized anxiety disorder: Secondary | ICD-10-CM | POA: Diagnosis not present

## 2016-03-05 DIAGNOSIS — Z90722 Acquired absence of ovaries, bilateral: Secondary | ICD-10-CM | POA: Diagnosis not present

## 2016-03-05 DIAGNOSIS — I10 Essential (primary) hypertension: Secondary | ICD-10-CM | POA: Diagnosis not present

## 2016-03-05 DIAGNOSIS — Z952 Presence of prosthetic heart valve: Secondary | ICD-10-CM | POA: Diagnosis not present

## 2016-03-05 DIAGNOSIS — Z7901 Long term (current) use of anticoagulants: Secondary | ICD-10-CM | POA: Insufficient documentation

## 2016-03-05 DIAGNOSIS — Z8542 Personal history of malignant neoplasm of other parts of uterus: Secondary | ICD-10-CM | POA: Diagnosis not present

## 2016-03-05 DIAGNOSIS — E785 Hyperlipidemia, unspecified: Secondary | ICD-10-CM | POA: Insufficient documentation

## 2016-03-05 DIAGNOSIS — C541 Malignant neoplasm of endometrium: Secondary | ICD-10-CM

## 2016-03-05 DIAGNOSIS — Z923 Personal history of irradiation: Secondary | ICD-10-CM | POA: Diagnosis not present

## 2016-03-05 DIAGNOSIS — Z90710 Acquired absence of both cervix and uterus: Secondary | ICD-10-CM | POA: Insufficient documentation

## 2016-03-05 DIAGNOSIS — Z79899 Other long term (current) drug therapy: Secondary | ICD-10-CM | POA: Insufficient documentation

## 2016-03-05 NOTE — Progress Notes (Signed)
  Oncology Nurse Navigator Documentation  Navigator Location: [facility] (03/05/16 1300) Navigator Encounter Type: [facility] Follow-up (03/05/16 1300)                                          Time Spent with Patient: 15 (03/05/16 1300)   Chaperoned pelvic exam. To follow up in one year.

## 2016-03-05 NOTE — Progress Notes (Signed)
Patient here for follow up. No concerns today. 

## 2016-03-05 NOTE — Progress Notes (Addendum)
Gynecologic Oncology Consult Visit   Referring Provider: Dr. Vikki Ports Ward   Chief Concern: Endometrial cancer  Subjective:  Crystal Landry is a 82 y.o. female who is seen in consultation from Dr. Ginette Pitman for endometrial cancer.  Complains of some fatigue, back pain and depression.  Denies abdominal pain, vaginal discharge, fever, chills, changes in bowel movements or urinary symptoms.     Oncology history Crystal Landry is a 82 y.o. female with patient medical history significant for mechanic mitral valve on long term anticoagulation referred by Dr. Larey Days for evaluation of endometrial cancer. Patient presented to the ED at [facility] on 10/08/14 with an episode of vaginal bleeding in setting of supratherapeutic INR.  A mass was noted to be protruding from the cervical os and biopsy revealed endometrial adenocarcinoma, FIGO grade 2. A CT scan revealed an unusually short and heterogeneous uterus without adenopathy or suggestion of extrauterine spread.    On 10/16/14 she underwent TLH/BSO SLN biopsies at [facility] for stage II, grade 2 endometrioid endometrial cancer.  Cancer 4.7 cm in diameter and was invading 1.3 cm in 1.7 cm myometrium.  No LVSI and washings negative.  Adnexa negative.  Cervical stromal invasion noted.    She received post op pelvic radiation with external beam therapy and vaginal brachytherapy with Dr Baruch Gouty at [facility]  Problem List: Patient Active Problem List   Diagnosis Date Noted  . Anxiety, generalized 10/04/2015  . Fatigue 10/04/2015  . False positive serological test for syphilis 10/17/2014  . Other specified abnormal immunological findings in serum 10/17/2014  . Cancer of endometrium ([place]) 10/13/2014  . Malignant neoplasm of endometrium ([place]) 10/13/2014  . H/O prosthetic heart valve 04/10/2014  . HLD (hyperlipidemia) 04/10/2014  . BP (high blood pressure) 04/10/2014  . A-fib ([place]) 01/25/2014  . Atrial fibrillation ([place]) 01/25/2014    Past Medical  History: Past Medical History  Diagnosis Date  . Atrial fibrillation ([place])   . Anxiety   . Hypertension   . A-fib ([place])   . Cancer [facility])     endometrial    Past Surgical History: Past Surgical History  Procedure Laterality Date  . Mitral valve replacement      OB History:  OB History  No data available    Family History: History reviewed. No pertinent family history.  Social History: Social History   Social History  . Marital Status: Widowed    Spouse Name: N/A  . Number of Children: N/A  . Years of Education: N/A   Occupational History  . Not on file.   Social History Main Topics  . Smoking status: Never Smoker   . Smokeless tobacco: Never Used  . Alcohol Use: Not on file  . Drug Use: Not on file  . Sexual Activity: Not on file   Other Topics Concern  . Not on file   Social History Narrative    Allergies: Allergies  Allergen Reactions  . Codeine Sulfate     Other reaction(s): Unknown  . Diphenhydramine Other (See Comments)    skin became red all over  . Ivp Dye [Iodinated Diagnostic Agents] Other (See Comments)    Skin becomes red all over  . Quinidine     Other reaction(s): Unknown Other reaction(s): Unknown    Current Medications: Current Outpatient Prescriptions  Medication Sig Dispense Refill  . acetaminophen (TYLENOL) 325 MG tablet Take 650 mg by mouth every 4 (four) hours as needed for mild pain (or temp greater than 100.4).    Marland Kitchen ALPRAZolam (  XANAX) 0.5 MG tablet Take 0.5 mg by mouth 3 (three) times daily as needed for anxiety.    . Ascorbic Acid (VITAMIN C) 1000 MG tablet Take by mouth.    . benazepril (LOTENSIN) 20 MG tablet Take 20 mg by mouth daily.    . citalopram (CELEXA) 10 MG tablet Take 1 tablet (10 mg total) by mouth at bedtime. 30 tablet 5  . diltiazem (CARDIZEM) 60 MG tablet Take 120 mg by mouth daily as needed (for increased heart rate).    Marland Kitchen diltiazem (TIAZAC) 300 MG 24 hr capsule Take 300 mg by mouth daily.    . furosemide  (LASIX) 20 MG tablet Take 20 mg by mouth daily.    Marland Kitchen glucosamine-chondroitin 500-400 MG tablet Take 1 tablet by mouth 3 (three) times daily.    . Multiple Vitamin (MULTI-VITAMINS) TABS Take by mouth.    . simvastatin (ZOCOR) 20 MG tablet Take 20 mg by mouth at bedtime.    . temazepam (RESTORIL) 15 MG capsule Take 15 mg by mouth at bedtime as needed for sleep.    Marland Kitchen warfarin (COUMADIN) 6 MG tablet Take 5 mg by mouth daily at 6 PM.      No current facility-administered medications for this visit.    Review of Systems See HPI  Objective:  Physical Examination:  BP 143/84 mmHg  Pulse 93  Temp(Src) 98.9 F (37.2 C) (Tympanic)  Ht 5\' 2"  (1.575 m)  Wt 134 lb 14.7 oz (61.2 kg)  BMI 24.67 kg/m2   ECOG Performance Status: 1 - Symptomatic but completely ambulatory  General appearance: alert, cooperative and appears stated age HEENT:PERRLA and thyroid without masses Lymph node survey: non-palpable, axillary, inguinal, supraclavicular Cardiovascular: irregular rate, no murmurs or gallops Respiratory: normal air entry, lungs clear to auscultation Breast exam: not examined. Abdomen: soft, non-tender, without masses or organomegaly, no hernias and well healed incision Back: inspection of back is normal Extremities: extremities normal, atraumatic, no cyanosis or edema Skin exam - normal coloration and turgor, no rashes, no suspicious skin lesions noted. Neurological exam reveals alert, oriented, normal speech, no focal findings or movement disorder noted.  Pelvic: exam chaperoned by nurse;  Vulva: normal appearing Vagina: shortened due to radiation and atrophic, Bimanual/RV: no masses.    Assessment:  Crystal Landry is a 82 y.o. female diagnosed with stage II grade 2 endometrial cancer s/p surgery and radiation.  NED  Plan:   Problem List Items Addressed This Visit      Genitourinary   Malignant neoplasm of endometrium ([place]) - Primary     She will see Dr. Baruch Gouty in 6 months and  we will see her back in 12 months unless any concerning symptoms arise.    Mellody Drown, MD  CC:  [name], MD [address] [place], [place] [address] [phone]

## 2016-04-02 ENCOUNTER — Other Ambulatory Visit: Payer: Self-pay | Admitting: Internal Medicine

## 2016-04-02 DIAGNOSIS — Z1231 Encounter for screening mammogram for malignant neoplasm of breast: Secondary | ICD-10-CM

## 2016-04-14 ENCOUNTER — Other Ambulatory Visit: Payer: Self-pay | Admitting: Internal Medicine

## 2016-04-14 ENCOUNTER — Ambulatory Visit
Admission: RE | Admit: 2016-04-14 | Discharge: 2016-04-14 | Disposition: A | Discharge status: Home / Self Care | Payer: Medicare Other | Source: Ambulatory Visit | Attending: Internal Medicine | Admitting: Internal Medicine

## 2016-04-14 DIAGNOSIS — Z1231 Encounter for screening mammogram for malignant neoplasm of breast: Secondary | ICD-10-CM

## 2016-07-18 ENCOUNTER — Encounter: Payer: Self-pay | Admitting: *Deleted

## 2016-08-27 ENCOUNTER — Ambulatory Visit: Payer: Medicare Other | Admitting: Radiation Oncology

## 2016-08-28 ENCOUNTER — Encounter: Payer: Self-pay | Admitting: Radiation Oncology

## 2016-08-28 ENCOUNTER — Ambulatory Visit
Admission: RE | Admit: 2016-08-28 | Discharge: 2016-08-28 | Disposition: A | Discharge status: Home / Self Care | Payer: Medicare Other | Source: Ambulatory Visit | Attending: Radiation Oncology | Admitting: Radiation Oncology

## 2016-08-28 VITALS — BP 110/58 | Temp 97.2°F | Resp 20 | Wt 136.2 lb

## 2016-08-28 DIAGNOSIS — Z923 Personal history of irradiation: Secondary | ICD-10-CM | POA: Diagnosis not present

## 2016-08-28 DIAGNOSIS — Z8542 Personal history of malignant neoplasm of other parts of uterus: Secondary | ICD-10-CM | POA: Diagnosis present

## 2016-08-28 DIAGNOSIS — C541 Malignant neoplasm of endometrium: Secondary | ICD-10-CM

## 2016-08-28 NOTE — Progress Notes (Signed)
Radiation Oncology Follow up Note  Name: Crystal Landry   Date:   08/28/2016 MRN:  [id number] DOB: 13-Sep-1927    This 82 y.o. female presents to the clinic today for one half year follow-up status post radiation therapy for endometrial carcinoma.  REFERRING PROVIDER: [name], MD  HPI: Patient is an 82 year old female now out 18 months having completed pelvic radiation as well as vaginal brachytherapy for FIGO grade 2 endometrial carcinoma with greater than 50% myometrial invasion. She is seen today in routine follow-up and is doing well. She specifically denies diarrhea dysuria or any other GI/GU complaints..  COMPLICATIONS OF TREATMENT: none  FOLLOW UP COMPLIANCE: keeps appointments   PHYSICAL EXAM:  BP (!) 110/58   Temp 97.2 F (36.2 C)   Resp 20   Wt 136 lb 3.9 oz (61.8 kg)   BMI 24.92 kg/m  On speculum examination vaginal vault is shortene July d with some vaginal scarring. No evidence of mass or nodularity is noted. Bimalleolar examination shows no evidence of mass in the rectal cavity or any parametrial disease. Well-developed well-nourished patient in NAD. HEENT reveals PERLA, EOMI, discs not visualized.  Oral cavity is clear. No oral mucosal lesions are identified. Neck is clear without evidence of cervical or supraclavicular adenopathy. Lungs are clear to A&P. Cardiac examination is essentially unremarkable with regular rate and rhythm without murmur rub or thrill. Abdomen is benign with no organomegaly or masses noted. Motor sensory and DTR levels are equal and symmetric in the upper and lower extremities. Cranial nerves II through XII are grossly intact. Proprioception is intact. No peripheral adenopathy or edema is identified. No motor or sensory levels are noted. Crude visual fields are within normal range.  RADIOLOGY RESULTS: Patient had mammograms back in July which were benign  PLAN: Present time patient is doing well with no evidence of disease. I am  please were overall progress. I've asked to see her back in 1 year for follow-up. She continues close follow-up care with GYN oncology. Patient knows to call with any concerns.  I would like to take this opportunity to thank you for allowing me to participate in the care of your patient.Armstead Peaks., MD

## 2017-03-04 ENCOUNTER — Encounter: Payer: Self-pay | Admitting: Obstetrics and Gynecology

## 2017-03-04 ENCOUNTER — Inpatient Hospital Stay: Payer: Medicare Other | Attending: Obstetrics and Gynecology | Admitting: Obstetrics and Gynecology

## 2017-03-04 VITALS — BP 122/68 | HR 67 | Temp 99.2°F | Resp 18 | Ht 67.0 in | Wt 136.8 lb

## 2017-03-04 DIAGNOSIS — R5383 Other fatigue: Secondary | ICD-10-CM | POA: Diagnosis not present

## 2017-03-04 DIAGNOSIS — Z952 Presence of prosthetic heart valve: Secondary | ICD-10-CM | POA: Diagnosis not present

## 2017-03-04 DIAGNOSIS — Z79899 Other long term (current) drug therapy: Secondary | ICD-10-CM | POA: Diagnosis not present

## 2017-03-04 DIAGNOSIS — Z7901 Long term (current) use of anticoagulants: Secondary | ICD-10-CM | POA: Diagnosis not present

## 2017-03-04 DIAGNOSIS — Z90722 Acquired absence of ovaries, bilateral: Secondary | ICD-10-CM | POA: Diagnosis not present

## 2017-03-04 DIAGNOSIS — Z923 Personal history of irradiation: Secondary | ICD-10-CM | POA: Diagnosis not present

## 2017-03-04 DIAGNOSIS — I4891 Unspecified atrial fibrillation: Secondary | ICD-10-CM | POA: Diagnosis not present

## 2017-03-04 DIAGNOSIS — I10 Essential (primary) hypertension: Secondary | ICD-10-CM | POA: Insufficient documentation

## 2017-03-04 DIAGNOSIS — C541 Malignant neoplasm of endometrium: Secondary | ICD-10-CM

## 2017-03-04 DIAGNOSIS — Z8542 Personal history of malignant neoplasm of other parts of uterus: Secondary | ICD-10-CM | POA: Diagnosis not present

## 2017-03-04 DIAGNOSIS — E785 Hyperlipidemia, unspecified: Secondary | ICD-10-CM | POA: Diagnosis not present

## 2017-03-04 DIAGNOSIS — F329 Major depressive disorder, single episode, unspecified: Secondary | ICD-10-CM | POA: Insufficient documentation

## 2017-03-04 DIAGNOSIS — Z90710 Acquired absence of both cervix and uterus: Secondary | ICD-10-CM | POA: Diagnosis not present

## 2017-03-04 NOTE — Progress Notes (Signed)
  Oncology Nurse Navigator Documentation Chaperoned pelvic exam. Follow up in one year. Navigator Location: [facility] (03/04/17 0900)   )Navigator Encounter Type: Follow-up Appt (03/04/17 0900)                     Patient Visit Type: GynOnc (03/04/17 0900)                              Time Spent with Patient: 15 (03/04/17 0900)

## 2017-03-04 NOTE — Progress Notes (Addendum)
Gynecologic Oncology Consult Visit   Referring Provider: Dr. Vikki Ports Ward   Chief Concern: Endometrial cancer, stage II  Subjective:  SHARNETTA GIELOW is a 83 y.o. female seen in consultation from Dr. Ginette Pitman for endometrial cancer.  Complains of some fatigue, back pain and depression.  Denies abdominal pain, vaginal discharge, fever, chills, changes in bowel movements or urinary symptoms.     Oncology history Maribell Demeo has medical history significant for mechanic mitral valve on long term anticoagulation referred by Dr. Larey Days for evaluation of endometrial cancer. Patient presented to the ED at [facility] on 10/08/14 with an episode of vaginal bleeding in setting of supratherapeutic INR.  A mass was noted to be protruding from the cervical os and biopsy revealed endometrial adenocarcinoma, FIGO grade 2. A CT scan revealed an unusually short and heterogeneous uterus without adenopathy or suggestion of extrauterine spread.    On 10/16/14 she underwent TLH/BSO SLN biopsies at [facility] for stage II, grade 2 endometrioid endometrial cancer.  Cancer 4.7 cm in diameter and was invading 1.3 cm in 1.7 cm myometrium.  No LVSI and washings negative.  Adnexa and SLNs negative.  Cervical stromal invasion noted. She received post op pelvic radiation with external beam therapy and vaginal brachytherapy with Dr Baruch Gouty at [facility]  Problem List: Patient Active Problem List   Diagnosis Date Noted  . Anxiety, generalized 10/04/2015  . Fatigue 10/04/2015  . False positive serological test for syphilis 10/17/2014  . Other specified abnormal immunological findings in serum 10/17/2014  . Cancer of endometrium ([place]) 10/13/2014  . Malignant neoplasm of endometrium ([place]) 10/13/2014  . H/O prosthetic heart valve 04/10/2014  . HLD (hyperlipidemia) 04/10/2014  . BP (high blood pressure) 04/10/2014  . A-fib ([place]) 01/25/2014  . Atrial fibrillation ([place]) 01/25/2014    Past Medical History: Past Medical  History:  Diagnosis Date  . A-fib ([place])   . Anxiety   . Atrial fibrillation ([place])   . Cancer [facility])    endometrial  . Hypertension     Past Surgical History: Past Surgical History:  Procedure Laterality Date  . MITRAL VALVE REPLACEMENT      OB History:  OB History  No data available    Family History: Family History  Problem Relation Age of Onset  . Breast cancer Neg Hx     Social History: Social History   Social History  . Marital status: Widowed    Spouse name: N/A  . Number of children: N/A  . Years of education: N/A   Occupational History  . Not on file.   Social History Main Topics  . Smoking status: Never Smoker  . Smokeless tobacco: Never Used  . Alcohol use Not on file  . Drug use: Unknown  . Sexual activity: Not on file   Other Topics Concern  . Not on file   Social History Narrative  . No narrative on file    Allergies: Allergies  Allergen Reactions  . Codeine Sulfate     Other reaction(s): Unknown  . Diphenhydramine Other (See Comments)    skin became red all over  . Ivp Dye [Iodinated Diagnostic Agents] Other (See Comments)    Skin becomes red all over  . Quinidine     Other reaction(s): Unknown Other reaction(s): Unknown    Current Medications: Current Outpatient Prescriptions  Medication Sig Dispense Refill  . acetaminophen (TYLENOL) 325 MG tablet Take 650 mg by mouth every 4 (four) hours as needed for mild pain (or temp greater than  100.4).    Marland Kitchen ALPRAZolam (XANAX) 0.5 MG tablet Take 0.5 mg by mouth 3 (three) times daily as needed for anxiety.    . Ascorbic Acid (VITAMIN C) 1000 MG tablet Take by mouth.    . benazepril (LOTENSIN) 20 MG tablet Take 20 mg by mouth daily.    Marland Kitchen buPROPion (WELLBUTRIN SR) 150 MG 12 hr tablet Take 1 tab po qd x 1 week and then increase to BID    . citalopram (CELEXA) 10 MG tablet Take 1 tablet (10 mg total) by mouth at bedtime. 30 tablet 5  . diltiazem (CARDIZEM) 60 MG tablet Take 120 mg by mouth daily  as needed (for increased heart rate).    Marland Kitchen diltiazem (CARTIA XT) 300 MG 24 hr capsule take 1 capsule by mouth once daily    . diltiazem (TIAZAC) 300 MG 24 hr capsule Take 300 mg by mouth daily.    . furosemide (LASIX) 20 MG tablet Take 20 mg by mouth daily.    Marland Kitchen glucosamine-chondroitin 500-400 MG tablet Take 1 tablet by mouth 3 (three) times daily.    . Multiple Vitamin (MULTI-VITAMINS) TABS Take by mouth.    . simvastatin (ZOCOR) 20 MG tablet Take 20 mg by mouth at bedtime.    . temazepam (RESTORIL) 15 MG capsule Take 15 mg by mouth at bedtime as needed for sleep.    Marland Kitchen warfarin (COUMADIN) 4 MG tablet take 1 tablet by mouth once daily    . warfarin (COUMADIN) 6 MG tablet Take 5 mg by mouth daily at 6 PM. Taking Sat and Sunday     No current facility-administered medications for this visit.     Review of Systems See HPI  Objective:  Physical Examination:  BP 122/68   Pulse 67   Temp 99.2 F (37.3 C) (Tympanic)   Resp 18   Ht 5\' 7"  (1.702 m)   Wt 136 lb 12.8 oz (62.1 kg)   BMI 21.43 kg/m    ECOG Performance Status: 1 - Symptomatic but completely ambulatory  General appearance: alert, cooperative and appears stated age HEENT:PERRLA and thyroid without masses Lymph node survey: non-palpable, axillary, inguinal, supraclavicular Cardiovascular: irregular rate, no murmurs or gallops Respiratory: normal air entry, lungs clear to auscultation Breast exam: not examined. Abdomen: soft, non-tender, without masses or organomegaly, no hernias and well healed incision Back: inspection of back is normal Extremities: extremities normal, atraumatic, no cyanosis or edema Skin exam - normal coloration and turgor, no rashes, no suspicious skin lesions noted. Neurological exam reveals alert, oriented, normal speech, no focal findings or movement disorder noted.  Pelvic: exam chaperoned by nurse;  Vulva: normal appearing Vagina: shortened due to radiation and atrophy, Some mild agglutination of  the upper vault broken down manually.  Bimanual/RV: no masses.    Assessment:  ARIANIE COUSE is a 83 y.o. female diagnosed with stage II grade 2 endometrial cancer s/p TLH/BSO with negative SLNs 1/16.  Had cervical stromal invasion and received adjuvant radiation with external pelvic RT + vaginal brachytherapy.  NED  Plan:   Problem List Items Addressed This Visit      Genitourinary   Cancer of endometrium ([place]) - Primary     She will see Dr. Baruch Gouty in 6 months and we will see her back in 12 months unless any concerning symptoms arise.    Mellody Drown, MD  CC:  [name], MD [address] [place], [place] [address] [phone]

## 2017-04-02 ENCOUNTER — Other Ambulatory Visit: Payer: Self-pay | Admitting: Internal Medicine

## 2017-04-02 DIAGNOSIS — Z1231 Encounter for screening mammogram for malignant neoplasm of breast: Secondary | ICD-10-CM

## 2017-05-11 ENCOUNTER — Ambulatory Visit
Admission: RE | Admit: 2017-05-11 | Discharge: 2017-05-11 | Disposition: A | Discharge status: Home / Self Care | Payer: Medicare Other | Source: Ambulatory Visit | Attending: Internal Medicine | Admitting: Internal Medicine

## 2017-05-11 DIAGNOSIS — Z1231 Encounter for screening mammogram for malignant neoplasm of breast: Secondary | ICD-10-CM | POA: Diagnosis not present

## 2017-05-28 ENCOUNTER — Encounter: Payer: Self-pay | Admitting: Emergency Medicine

## 2017-05-28 ENCOUNTER — Emergency Department: Payer: Medicare Other

## 2017-05-28 ENCOUNTER — Inpatient Hospital Stay: Payer: Medicare Other

## 2017-05-28 ENCOUNTER — Inpatient Hospital Stay
Admission: EM | Admit: 2017-05-28 | Discharge: 2017-06-11 | DRG: 381 | Disposition: A | Discharge status: Other Acute Inpatient Hospital | Payer: Medicare Other | Attending: Internal Medicine | Admitting: Internal Medicine

## 2017-05-28 DIAGNOSIS — K2900 Acute gastritis without bleeding: Secondary | ICD-10-CM | POA: Diagnosis present

## 2017-05-28 DIAGNOSIS — K209 Esophagitis, unspecified: Secondary | ICD-10-CM | POA: Diagnosis present

## 2017-05-28 DIAGNOSIS — Z888 Allergy status to other drugs, medicaments and biological substances status: Secondary | ICD-10-CM

## 2017-05-28 DIAGNOSIS — K567 Ileus, unspecified: Secondary | ICD-10-CM | POA: Diagnosis present

## 2017-05-28 DIAGNOSIS — N189 Chronic kidney disease, unspecified: Secondary | ICD-10-CM | POA: Diagnosis present

## 2017-05-28 DIAGNOSIS — N1330 Unspecified hydronephrosis: Secondary | ICD-10-CM | POA: Diagnosis present

## 2017-05-28 DIAGNOSIS — Z79899 Other long term (current) drug therapy: Secondary | ICD-10-CM | POA: Diagnosis not present

## 2017-05-28 DIAGNOSIS — Z800 Family history of malignant neoplasm of digestive organs: Secondary | ICD-10-CM

## 2017-05-28 DIAGNOSIS — Z8542 Personal history of malignant neoplasm of other parts of uterus: Secondary | ICD-10-CM | POA: Diagnosis not present

## 2017-05-28 DIAGNOSIS — K862 Cyst of pancreas: Secondary | ICD-10-CM | POA: Diagnosis present

## 2017-05-28 DIAGNOSIS — R001 Bradycardia, unspecified: Secondary | ICD-10-CM | POA: Diagnosis present

## 2017-05-28 DIAGNOSIS — D6869 Other thrombophilia: Secondary | ICD-10-CM | POA: Diagnosis present

## 2017-05-28 DIAGNOSIS — R109 Unspecified abdominal pain: Secondary | ICD-10-CM | POA: Diagnosis present

## 2017-05-28 DIAGNOSIS — Z823 Family history of stroke: Secondary | ICD-10-CM

## 2017-05-28 DIAGNOSIS — Z4659 Encounter for fitting and adjustment of other gastrointestinal appliance and device: Secondary | ICD-10-CM

## 2017-05-28 DIAGNOSIS — E860 Dehydration: Secondary | ICD-10-CM | POA: Diagnosis present

## 2017-05-28 DIAGNOSIS — K315 Obstruction of duodenum: Secondary | ICD-10-CM | POA: Diagnosis present

## 2017-05-28 DIAGNOSIS — I4891 Unspecified atrial fibrillation: Secondary | ICD-10-CM | POA: Diagnosis present

## 2017-05-28 DIAGNOSIS — E785 Hyperlipidemia, unspecified: Secondary | ICD-10-CM | POA: Diagnosis present

## 2017-05-28 DIAGNOSIS — Z91041 Radiographic dye allergy status: Secondary | ICD-10-CM | POA: Diagnosis not present

## 2017-05-28 DIAGNOSIS — K3184 Gastroparesis: Secondary | ICD-10-CM

## 2017-05-28 DIAGNOSIS — R938 Abnormal findings on diagnostic imaging of other specified body structures: Secondary | ICD-10-CM | POA: Diagnosis not present

## 2017-05-28 DIAGNOSIS — I482 Chronic atrial fibrillation: Secondary | ICD-10-CM | POA: Diagnosis present

## 2017-05-28 DIAGNOSIS — Z7901 Long term (current) use of anticoagulants: Secondary | ICD-10-CM

## 2017-05-28 DIAGNOSIS — K2980 Duodenitis without bleeding: Secondary | ICD-10-CM | POA: Diagnosis present

## 2017-05-28 DIAGNOSIS — Z90710 Acquired absence of both cervix and uterus: Secondary | ICD-10-CM

## 2017-05-28 DIAGNOSIS — Z808 Family history of malignant neoplasm of other organs or systems: Secondary | ICD-10-CM | POA: Diagnosis not present

## 2017-05-28 DIAGNOSIS — N179 Acute kidney failure, unspecified: Secondary | ICD-10-CM | POA: Diagnosis present

## 2017-05-28 DIAGNOSIS — R112 Nausea with vomiting, unspecified: Secondary | ICD-10-CM | POA: Insufficient documentation

## 2017-05-28 DIAGNOSIS — Z952 Presence of prosthetic heart valve: Secondary | ICD-10-CM

## 2017-05-28 DIAGNOSIS — I10 Essential (primary) hypertension: Secondary | ICD-10-CM | POA: Diagnosis present

## 2017-05-28 DIAGNOSIS — K56609 Unspecified intestinal obstruction, unspecified as to partial versus complete obstruction: Secondary | ICD-10-CM | POA: Diagnosis not present

## 2017-05-28 DIAGNOSIS — T45515A Adverse effect of anticoagulants, initial encounter: Secondary | ICD-10-CM | POA: Diagnosis present

## 2017-05-28 DIAGNOSIS — Z0189 Encounter for other specified special examinations: Secondary | ICD-10-CM | POA: Diagnosis not present

## 2017-05-28 DIAGNOSIS — Z452 Encounter for adjustment and management of vascular access device: Secondary | ICD-10-CM

## 2017-05-28 DIAGNOSIS — Z885 Allergy status to narcotic agent status: Secondary | ICD-10-CM

## 2017-05-28 LAB — COMPREHENSIVE METABOLIC PANEL
ALT: 17 U/L (ref 14–54)
ANION GAP: 12 (ref 5–15)
AST: 33 U/L (ref 15–41)
Albumin: 4.5 g/dL (ref 3.5–5.0)
Alkaline Phosphatase: 75 U/L (ref 38–126)
BUN: 22 mg/dL — AB (ref 6–20)
CHLORIDE: 97 mmol/L — AB (ref 101–111)
CO2: 29 mmol/L (ref 22–32)
Calcium: 9.7 mg/dL (ref 8.9–10.3)
Creatinine, Ser: 1.48 mg/dL — ABNORMAL HIGH (ref 0.44–1.00)
GFR calc Af Amer: 35 mL/min — ABNORMAL LOW (ref >60)
GFR calc non Af Amer: 31 mL/min — ABNORMAL LOW (ref >60)
Glucose, Bld: 112 mg/dL — ABNORMAL HIGH (ref 65–99)
POTASSIUM: 3.8 mmol/L (ref 3.5–5.1)
Sodium: 138 mmol/L (ref 135–145)
Total Bilirubin: 1.1 mg/dL (ref 0.3–1.2)
Total Protein: 7.6 g/dL (ref 6.5–8.1)

## 2017-05-28 LAB — CBC
HEMATOCRIT: 39.8 % (ref 35.0–47.0)
HEMOGLOBIN: 13.7 g/dL (ref 12.0–16.0)
MCH: 33.5 pg (ref 26.0–34.0)
MCHC: 34.4 g/dL (ref 32.0–36.0)
MCV: 97.6 fL (ref 80.0–100.0)
Platelets: 221 10*3/uL (ref 150–440)
RBC: 4.08 MIL/uL (ref 3.80–5.20)
RDW: 14.8 % — AB (ref 11.5–14.5)
WBC: 8.6 10*3/uL (ref 3.6–11.0)

## 2017-05-28 LAB — LIPASE, BLOOD: LIPASE: 36 U/L (ref 11–51)

## 2017-05-28 LAB — URINALYSIS, COMPLETE (UACMP) WITH MICROSCOPIC
BACTERIA UA: NONE SEEN
Bilirubin Urine: NEGATIVE
Glucose, UA: NEGATIVE mg/dL
Ketones, ur: NEGATIVE mg/dL
NITRITE: NEGATIVE
Protein, ur: 30 mg/dL — AB
SPECIFIC GRAVITY, URINE: 1.019 (ref 1.005–1.030)
pH: 7 (ref 5.0–8.0)

## 2017-05-28 LAB — PROTIME-INR
INR: 2.62
PROTHROMBIN TIME: 27.8 s — AB (ref 11.4–15.2)

## 2017-05-28 MED ORDER — SODIUM CHLORIDE 0.9 % IV SOLN
INTRAVENOUS | Status: DC
  Administered 2017-05-28 – 2017-05-29 (×2): via INTRAVENOUS

## 2017-05-28 MED ORDER — DILTIAZEM HCL 100 MG IV SOLR
5.0000 mg/h | INTRAVENOUS | Status: DC
  Administered 2017-05-28 – 2017-05-30 (×3): 5 mg/h via INTRAVENOUS
  Filled 2017-05-28 (×4): qty 100

## 2017-05-28 MED ORDER — PANTOPRAZOLE SODIUM 40 MG IV SOLR
40.0000 mg | Freq: Two times a day (BID) | INTRAVENOUS | Status: DC
Start: 2017-05-28 — End: 2017-06-12
  Administered 2017-05-28 – 2017-06-11 (×29): 40 mg via INTRAVENOUS
  Filled 2017-05-28 (×30): qty 40

## 2017-05-28 MED ORDER — ACETAMINOPHEN 650 MG RE SUPP
650.0000 mg | Freq: Four times a day (QID) | RECTAL | Status: DC | PRN
  Administered 2017-05-28 – 2017-05-29 (×4): 650 mg via RECTAL
  Filled 2017-05-28 (×5): qty 1

## 2017-05-28 MED ORDER — PANTOPRAZOLE SODIUM 40 MG IV SOLR
INTRAVENOUS | Status: AC
  Administered 2017-05-28: 40 mg via INTRAVENOUS
  Filled 2017-05-28: qty 40

## 2017-05-28 MED ORDER — ONDANSETRON HCL 4 MG/2ML IJ SOLN
4.0000 mg | Freq: Four times a day (QID) | INTRAMUSCULAR | Status: DC | PRN
  Administered 2017-06-06 – 2017-06-11 (×7): 4 mg via INTRAVENOUS
  Filled 2017-05-28 (×6): qty 2

## 2017-05-28 MED ORDER — ACETAMINOPHEN 325 MG PO TABS
650.0000 mg | ORAL_TABLET | Freq: Four times a day (QID) | ORAL | Status: DC | PRN
  Administered 2017-05-31: 325 mg via ORAL

## 2017-05-28 MED ORDER — ONDANSETRON HCL 4 MG/2ML IJ SOLN
4.0000 mg | Freq: Once | INTRAMUSCULAR | Status: AC
  Administered 2017-05-28: 4 mg via INTRAVENOUS
  Filled 2017-05-28: qty 2

## 2017-05-28 MED ORDER — LORAZEPAM 2 MG/ML IJ SOLN
0.5000 mg | Freq: Three times a day (TID) | INTRAMUSCULAR | Status: DC | PRN
  Administered 2017-05-28 – 2017-06-11 (×16): 0.5 mg via INTRAVENOUS
  Filled 2017-05-28 (×17): qty 1

## 2017-05-28 MED ORDER — ONDANSETRON HCL 4 MG PO TABS: 4.0000 mg | ORAL_TABLET | Freq: Four times a day (QID) | ORAL | Status: DC | PRN

## 2017-05-28 NOTE — ED Triage Notes (Addendum)
Pt sent from [facility] for evaluation of possible bowel obstruction. Pt reports intermittent abdominal cramping and diarrhea for two months. Reports nausea, denies vomiting. Pt had abdomen xray today that is suggestive of bowel obstruction versus ileus.

## 2017-05-28 NOTE — ED Notes (Signed)
Patient transported to CT 

## 2017-05-28 NOTE — Progress Notes (Signed)
Radiologist requesting abd xray for placement verification as previous xray was not low enough to see tip of tube in stomach.

## 2017-05-28 NOTE — ED Provider Notes (Addendum)
[facility] Emergency Department Provider Note  ____________________________________________   First MD Initiated Contact with Patient 05/28/17 1225     (approximate)  I have reviewed the triage vital signs and the nursing notes.   HISTORY  Chief Complaint Abdominal Pain    HPI Crystal Landry is a 83 y.o. female patient complains of intermittent nausea vomiting and diarrhea been going on for quite some time. She's had a lot of nausea and vomiting and diarrhea in the last day is not keeping anything down now. [facility] here because they thought they might see a bowel obstruction on plain film. Patient is also complaining of a headache although was not severe.   Past Medical History:  Diagnosis Date  . A-fib ([place])   . Anxiety   . Atrial fibrillation ([place])   . Cancer (HCC)    endometrial  . Depression   . Hypertension     Patient Active Problem List   Diagnosis Date Noted  . Anxiety, generalized 10/04/2015  . Fatigue 10/04/2015  . False positive serological test for syphilis 10/17/2014  . Other specified abnormal immunological findings in serum 10/17/2014  . Cancer of endometrium ([place]) 10/13/2014  . Malignant neoplasm of endometrium ([place]) 10/13/2014  . H/O prosthetic heart valve 04/10/2014  . HLD (hyperlipidemia) 04/10/2014  . BP (high blood pressure) 04/10/2014  . A-fib ([place]) 01/25/2014  . Atrial fibrillation ([place]) 01/25/2014    Past Surgical History:  Procedure Laterality Date  . ABDOMINAL HYSTERECTOMY    . MITRAL VALVE REPLACEMENT      Prior to Admission medications   Medication Sig Start Date End Date Taking? Authorizing Provider  acetaminophen (TYLENOL) 325 MG tablet Take 650 mg by mouth every 4 (four) hours as needed for mild pain (or temp greater than 100.4).    [provider]  ALPRAZolam Duanne Moron) 0.5 MG tablet Take 0.5 mg by mouth 3 (three) times daily as needed for anxiety.    [provider]    Ascorbic Acid (VITAMIN C) 1000 MG tablet Take 500 mg by mouth daily.     [provider]  benazepril (LOTENSIN) 20 MG tablet Take 20 mg by mouth daily.    [provider]  diltiazem (CARDIZEM) 60 MG tablet Take 120 mg by mouth daily as needed (for increased heart rate).    [provider]  diltiazem (CARTIA XT) 300 MG 24 hr capsule take 1 capsule by mouth once daily 08/11/16   [provider]  furosemide (LASIX) 20 MG tablet Take 20 mg by mouth daily.    [provider]  glucosamine-chondroitin 500-400 MG tablet Take 1 tablet by mouth 3 (three) times daily.    [provider]  Multiple Vitamin (MULTI-VITAMINS) TABS Take by mouth.    [provider]  simvastatin (ZOCOR) 20 MG tablet Take 20 mg by mouth at bedtime.    [provider]  temazepam (RESTORIL) 15 MG capsule Take 15 mg by mouth at bedtime as needed for sleep.    [provider]  warfarin (COUMADIN) 4 MG tablet take 1 tablet by mouth once daily 05/12/16   [provider]  warfarin (COUMADIN) 5 MG tablet Take 5 mg by mouth daily. ON SAT AND SUN    [provider]    Allergies Codeine sulfate; Diphenhydramine; Ivp dye [iodinated diagnostic agents]; and Quinidine  Family History  Problem Relation Age of Onset  . Stroke Father   . Brain cancer Brother   . Colon  cancer Brother   . Breast cancer Neg Hx     Social History Social History  Substance Use Topics  . Smoking status: Never Smoker  . Smokeless tobacco: Never Used  . Alcohol use No    Review of Systems  Constitutional: No fever/chills Eyes: No visual changes. ENT: No sore throat. Cardiovascular: Denies chest pain. Respiratory: Denies shortness of breath. Gastrointestinal: See history of present illness Genitourinary: Negative for dysuria. Musculoskeletal: Negative for back pain. Skin: Negative for rash. Neurological: Negative for focal weakness    ____________________________________________   PHYSICAL EXAM:  VITAL SIGNS: ED Triage Vitals  Enc Vitals Group     BP 05/28/17 1210 118/70     Pulse Rate 05/28/17 1210 64     Resp 05/28/17 1210 18     Temp 05/28/17 1210 97.6 F (36.4 C)     Temp Source 05/28/17 1210 Axillary     SpO2 05/28/17 1210 99 %     Weight 05/28/17 1211 133 lb (60.3 kg)     Height 05/28/17 1211 5' 2.5" (1.588 m)     Head Circumference --      Peak Flow --      Pain Score --      Pain Loc --      Pain Edu? --      Excl. in [place]? --     Constitutional: Alert and oriented. Well appearing and in no acute distress. Eyes: Conjunctivae are normal.  Head: Atraumatic. Nose: No congestion/rhinnorhea. Mouth/Throat: Mucous membranes are moist.  Oropharynx non-erythematous. Neck: No stridor  Cardiovascular: Normal rate, regular rhythm. Grossly normal heart sounds.  Good peripheral circulation. Respiratory: Normal respiratory effort.  No retractions. Lungs CTAB. Gastrointestinal: Soft Mildly diffusely tender. No distention. No abdominal bruits. No CVA tenderness. Musculoskeletal: No lower extremity tenderness nor edema.  No joint effusions. Neurologic:  Normal speech and language. No gross focal neurologic deficits are appreciated.. Skin:  Skin is warm, dry and intact. No rash noted. Psychiatric: Mood and affect are normal. Speech and behavior are normal.  ____________________________________________   LABS (all labs ordered are listed, but only abnormal results are displayed)  Labs Reviewed  COMPREHENSIVE METABOLIC PANEL - Abnormal; Notable for the following:       Result Value   Chloride 97 (*)    Glucose, Bld 112 (*)    BUN 22 (*)    Creatinine, Ser 1.48 (*)    GFR calc non Af Amer 31 (*)    GFR calc Af Amer 35 (*)    All other components within normal limits  CBC - Abnormal; Notable for the following:    RDW 14.8 (*)    All other components within normal limits  LIPASE, BLOOD  URINALYSIS,  COMPLETE (UACMP) WITH MICROSCOPIC   ____________________________________________  EKG  EKG read and interpreted by me shows A. fib at a rate of 109 left axis no acute ST-T wave changes there is also a right bundle branch and left anterior hemiblock. ____________________________________________  RADIOLOGY  Ct Abdomen Pelvis Wo Contrast  Result Date: 05/28/2017 CLINICAL DATA:  Abdominal pain, diarrhea and cramping for 2 months. EXAM: CT ABDOMEN AND PELVIS WITHOUT CONTRAST TECHNIQUE: Multidetector CT imaging of the abdomen and pelvis was performed following the standard protocol without IV contrast. COMPARISON:  CT scan 09/29/2014 FINDINGS: Lower chest: The lung bases are clear of acute process. Minimal basilar scarring changes. The heart is enlarged and there are it dense coronary artery calcifications and dense aortic valve and mitral valve annular calcifications.  Hepatobiliary: No focal hepatic lesions or intrahepatic biliary dilatation. The gallbladder is mildly distended and small layering gallstones are noted. No pericholecystic inflammatory changes or gallbladder wall thickening to suggest acute cholecystitis. Normal caliber common bile duct for age. Pancreas: Moderate pancreatic atrophy but no mass, inflammation or ductal dilatation. Spleen: Normal size.  No focal lesions. Adrenals/Urinary Tract: The adrenal glands are unremarkable and stable. Mild left-sided hydronephrosis and proximal left hydroureter but no obstructing ureteral calculi are identified. Possible recent stone passage and/or mild stricture. New line the right kidney is unremarkable. No renal or right ureteral calculi. Asymmetric bladder wall thickening appears relatively stable and could be due to chronic cystitis. I do not see a discrete mass. There is a right-sided diverticulum noted. Stomach/Bowel: Markedly distended stomach with fluid and air. The duodenum is also distended. Apparent wall thickening involving the third portion  the duodenum could be an inflammatory process. I do not see a discrete mass but an infiltrating lesion is possible. Endoscopy may be necessary for further evaluation. No findings to suggest a mid distal small bowel obstruction. The terminal ileum appears normal. The appendix is normal except for a few small appendicoliths. Vascular/Lymphatic: Advanced vascular calcifications but no focal aneurysm. No mesenteric or retroperitoneal mass or lymphadenopathy. Small scattered lymph nodes appears stable. Reproductive: Surgically absent. Other: No pelvic mass or adenopathy. No free pelvic fluid collections. No inguinal mass or adenopathy. No abdominal wall hernia or subcutaneous lesions. Musculoskeletal: Advanced degenerative changes involving the spine but no acute bony findings or worrisome bone lesions. Diffuse osteoporosis. IMPRESSION: 1. Markedly distended stomach and duodenum with apparent wall thickening involving the third portion the duodenum. Could not exclude a functional obstruction due to inflammation. No discrete mass but infiltrating tumor is possible. Endoscopy may be helpful for further evaluation. 2. No findings for small bowel obstruction or free air. 3. Cholelithiasis without definite findings for acute cholecystitis. 4. Advanced atherosclerotic calcifications involving the aorta and branch vessels. 5. Mild left-sided hydronephrosis and proximal left hydroureter without obvious cause. 6. Moderate bladder wall thickening with some asymmetry but no discrete mass. This appears relatively stable and could be due to chronic cystitis. Small right-sided bladder diverticulum noted. Electronically Signed   By: Marijo Sanes M.D.   On: 05/28/2017 13:49    ____________________________________________   PROCEDURES  Procedure(s) performed:   Procedures  Critical Care performed:  ____________________________________________   INITIAL IMPRESSION / ASSESSMENT AND PLAN / ED COURSE  Pertinent labs &  imaging results that were available during my care of the patient were reviewed by me and considered in my medical decision making (see chart for details).        ____________________________________________   FINAL CLINICAL IMPRESSION(S) / ED DIAGNOSES  Final diagnoses:  Abdominal pain, unspecified abdominal location  Non-intractable vomiting with nausea, unspecified vomiting type   Also possible bowel obstruction   NEW MEDICATIONS STARTED DURING THIS VISIT:  New Prescriptions   No medications on file     Note:  This document was prepared using Dragon voice recognition software and may include unintentional dictation errors.    Nena Polio, MD 05/28/17 1409    Nena Polio, MD 05/28/17 1420

## 2017-05-28 NOTE — H&P (Signed)
[place] [facility] NAME: Crystal Landry    MR#:  [id number]  DATE OF BIRTH:  July 01, 1927  DATE OF ADMISSION:  05/28/2017  PRIMARY CARE PHYSICIAN: [name], MD   REQUESTING/REFERRING PHYSICIAN: Nena Polio, MD  CHIEF COMPLAINT:   Abdominal pain HISTORY OF PRESENT ILLNESS:  Crystal Landry  is a 83 y.o. female with a known history of Atrial fibrillation with RVR, endometrial cancer, depression, hypertension is presenting to the ED with a chief complaint of epigastric abdominal pain, nausea and vomiting which is intractable. Patient also reports she has diarrhea but with that is chronic in nature for several months and reports 2-3 loose bowel movements denies any fever. CT abdomen has revealed  functional bowel obstruction, left-sided hydronephrosis and hydroureter. The ED physician has discussed with on-call surgery Dr. Burt Knack who has recommended medical admission and an surgical consult. Patient denies any dizzy spells  PAST MEDICAL HISTORY:   Past Medical History:  Diagnosis Date  . A-fib ([place])   . Anxiety   . Atrial fibrillation ([place])   . Cancer (HCC)    endometrial  . Depression   . Hypertension     PAST SURGICAL HISTOIRY:   Past Surgical History:  Procedure Laterality Date  . ABDOMINAL HYSTERECTOMY    . MITRAL VALVE REPLACEMENT      SOCIAL HISTORY:   Social History  Substance Use Topics  . Smoking status: Never Smoker  . Smokeless tobacco: Never Used  . Alcohol use No    FAMILY HISTORY:   Family History  Problem Relation Age of Onset  . Stroke Father   . Brain cancer Brother   . Colon cancer Brother   . Breast cancer Neg Hx     DRUG ALLERGIES:   Allergies  Allergen Reactions  . Codeine Sulfate     Other reaction(s): Unknown  . Diphenhydramine Other (See Comments)    skin became red all over  . Ivp Dye [Iodinated Diagnostic Agents] Other (See Comments)    Skin becomes red all over   . Quinidine     Other reaction(s): Unknown Other reaction(s): Unknown    REVIEW OF SYSTEMS:  CONSTITUTIONAL: No fever, fatigue or weakness.  EYES: No blurred or double vision.  EARS, NOSE, AND THROAT: No tinnitus or ear pain.  RESPIRATORY: No cough, shortness of breath, wheezing or hemoptysis.  CARDIOVASCULAR: No chest pain, orthopnea, edema.  GASTROINTESTINAL: Reporting nausea, vomiting and epigastric abdominal pain, chronic loose bowel movements 2-3 per day No nausea, vomiting, diarrhea or abdominal pain.  GENITOURINARY: No dysuria, hematuria.  ENDOCRINE: No polyuria, nocturia,  HEMATOLOGY: No anemia, easy bruising or bleeding SKIN: No rash or lesion. MUSCULOSKELETAL: No joint pain or arthritis.   NEUROLOGIC: No tingling, numbness, weakness.  PSYCHIATRY: No anxiety or depression.   MEDICATIONS AT HOME:   Prior to Admission medications   Medication Sig Start Date End Date Taking? Authorizing Provider  acetaminophen (TYLENOL) 325 MG tablet Take 650 mg by mouth every 4 (four) hours as needed for mild pain (or temp greater than 100.4).   Yes [provider]  ALPRAZolam Duanne Moron) 0.5 MG tablet Take 0.5 mg by mouth 2 (two) times daily as needed for anxiety.    Yes [provider]  Ascorbic Acid (VITAMIN C) 1000 MG tablet Take 500 mg by mouth daily.    Yes [provider]  benazepril (LOTENSIN) 20 MG tablet Take 20 mg by mouth daily.   Yes [provider]  diltiazem (CARDIZEM) 60 MG tablet Take 120 mg by mouth daily as needed (for increased heart rate).   Yes [provider]  diltiazem (CARTIA XT) 300 MG 24 hr capsule take 1 capsule by mouth once daily 08/11/16  Yes [provider]  furosemide (LASIX) 20 MG tablet Take 20 mg by mouth daily.   Yes [provider]  glucosamine-chondroitin 500-400 MG tablet Take 1 tablet by mouth 3 (three) times daily.   Yes [provider]  Multiple Vitamin (MULTI-VITAMINS) TABS Take 1  tablet by mouth daily.    Yes [provider]  simvastatin (ZOCOR) 20 MG tablet Take 20 mg by mouth at bedtime.   Yes [provider]  temazepam (RESTORIL) 15 MG capsule Take 15 mg by mouth at bedtime as needed for sleep.   Yes [provider]  warfarin (COUMADIN) 4 MG tablet take 1 tablet by mouth once daily 05/12/16  Yes [provider]      VITAL SIGNS:  Blood pressure 118/70, pulse 64, temperature 97.6 F (36.4 C), temperature source Axillary, resp. rate 18, height 5' 2.5" (1.588 m), weight 60.3 kg (133 lb), SpO2 99 %.  PHYSICAL EXAMINATION:  GENERAL:  83 y.o.-year-old patient lying in the bed with no acute distress.  EYES: Pupils equal, round, reactive to light and accommodation. No scleral icterus. Extraocular muscles intact.  HEENT: Head atraumatic, normocephalic. Oropharynx and nasopharynx clear.  NECK:  Supple, no jugular venous distention. No thyroid enlargement, no tenderness.  LUNGS: Normal breath sounds bilaterally, no wheezing, rales,rhonchi or crepitation. No use of accessory muscles of respiration.  CARDIOVASCULAR: Irregularly irregular. No murmurs, rubs, or gallops.  ABDOMEN: Soft, epigastric abdominal tenderness no rebound tenderness. Hypoactive Bowel sounds present.  EXTREMITIES: No pedal edema, cyanosis, or clubbing.  NEUROLOGIC: Cranial nerves II through XII are intact. Muscle strength 5/5 in all extremities. Sensation intact. Gait not checked.  PSYCHIATRIC: The patient is alert and oriented x 3.  SKIN: No obvious rash, lesion, or ulcer.   LABORATORY PANEL:   CBC  Recent Labs Lab 05/28/17 1212  WBC 8.6  HGB 13.7  HCT 39.8  PLT 221   ------------------------------------------------------------------------------------------------------------------  Chemistries   Recent Labs Lab 05/28/17 1212  NA 138  K 3.8  CL 97*  CO2 29  GLUCOSE 112*  BUN 22*  CREATININE 1.48*  CALCIUM 9.7  AST 33  ALT 17  ALKPHOS 75   BILITOT 1.1   ------------------------------------------------------------------------------------------------------------------  Cardiac Enzymes No results for input(s): TROPONINI in the last 168 hours. ------------------------------------------------------------------------------------------------------------------  RADIOLOGY:  Ct Abdomen Pelvis Wo Contrast  Result Date: 05/28/2017 CLINICAL DATA:  Abdominal pain, diarrhea and cramping for 2 months. EXAM: CT ABDOMEN AND PELVIS WITHOUT CONTRAST TECHNIQUE: Multidetector CT imaging of the abdomen and pelvis was performed following the standard protocol without IV contrast. COMPARISON:  CT scan 09/29/2014 FINDINGS: Lower chest: The lung bases are clear of acute process. Minimal basilar scarring changes. The heart is enlarged and there are it dense coronary artery calcifications and dense aortic valve and mitral valve annular calcifications. Hepatobiliary: No focal hepatic lesions or intrahepatic biliary dilatation. The gallbladder is mildly distended and small layering gallstones are noted. No pericholecystic inflammatory changes or gallbladder wall thickening to suggest acute cholecystitis. Normal caliber common bile duct for age. Pancreas: Moderate pancreatic atrophy but no mass, inflammation or ductal dilatation. Spleen: Normal size.  No focal lesions. Adrenals/Urinary Tract: The adrenal glands are unremarkable and stable. Mild left-sided hydronephrosis and proximal left hydroureter but no obstructing ureteral calculi are  identified. Possible recent stone passage and/or mild stricture. New line the right kidney is unremarkable. No renal or right ureteral calculi. Asymmetric bladder wall thickening appears relatively stable and could be due to chronic cystitis. I do not see a discrete mass. There is a right-sided diverticulum noted. Stomach/Bowel: Markedly distended stomach with fluid and air. The duodenum is also distended. Apparent wall thickening  involving the third portion the duodenum could be an inflammatory process. I do not see a discrete mass but an infiltrating lesion is possible. Endoscopy may be necessary for further evaluation. No findings to suggest a mid distal small bowel obstruction. The terminal ileum appears normal. The appendix is normal except for a few small appendicoliths. Vascular/Lymphatic: Advanced vascular calcifications but no focal aneurysm. No mesenteric or retroperitoneal mass or lymphadenopathy. Small scattered lymph nodes appears stable. Reproductive: Surgically absent. Other: No pelvic mass or adenopathy. No free pelvic fluid collections. No inguinal mass or adenopathy. No abdominal wall hernia or subcutaneous lesions. Musculoskeletal: Advanced degenerative changes involving the spine but no acute bony findings or worrisome bone lesions. Diffuse osteoporosis. IMPRESSION: 1. Markedly distended stomach and duodenum with apparent wall thickening involving the third portion the duodenum. Could not exclude a functional obstruction due to inflammation. No discrete mass but infiltrating tumor is possible. Endoscopy may be helpful for further evaluation. 2. No findings for small bowel obstruction or free air. 3. Cholelithiasis without definite findings for acute cholecystitis. 4. Advanced atherosclerotic calcifications involving the aorta and branch vessels. 5. Mild left-sided hydronephrosis and proximal left hydroureter without obvious cause. 6. Moderate bladder wall thickening with some asymmetry but no discrete mass. This appears relatively stable and could be due to chronic cystitis. Small right-sided bladder diverticulum noted. Electronically Signed   By: Marijo Sanes M.D.   On: 05/28/2017 13:49    EKG:   Orders placed or performed during the hospital encounter of 05/28/17  . EKG 12-Lead  . EKG 12-Lead    IMPRESSION AND PLAN:   Delanie Tirrell  is a 83 y.o. female with a known history of Atrial fibrillation with RVR,  endometrial cancer, depression, hypertension is presenting to the ED with a chief complaint of epigastric abdominal pain, nausea and vomiting which is intractable. Patient also reports she has diarrhea but with that is chronic in nature for several months and reports 2-3 loose bowel movements denies any fever. CT abdomen has revealed  functional bowel obstruction, left-sided hydronephrosis and hydroureter  #Atrial fibrillation with RVR Admit to telemetry Cardizem IV boluses, we'll start the patient on Cardizem drip if no improvement Cardiac consult is placed-kc Currently patient is nothing by mouth in view of ileus Check electrolytes, TSH Patient is on by mouth Coumadin and the patient is nothing by mouth, will start her on therapeutic dose of Lovenox  #Ileus- could be from the stricture of gastric ulcer Nothing by mouth Supportive treatment IV fluids, PPI GI and surgery consult  #Acute kidney injury from dehydration secondary to nausea and vomiting IV fluids, nephrotoxins will be avoided Monitor renal function closely  #Mild left-sided hydronephrosis and hydroureter with no obvious cause Insert Foley catheter and monitor output Discussed with on-call urologist who is recommending outpatient follow-up at this time we will cancel urology consult  #Hyperlipidemia Currently patient is nothing by mouth hold by mouth medications including statin  GI prophylaxis with PPI  DVT prophylaxis patient is on Coumadin-consult pharmacy for Coumadin management    All the records are reviewed and case discussed with ED provider. Management plans discussed  with the patient, family and they are in agreement.  CODE STATUS: fc,Son is the healthcare power of attorney  TOTAL  Critical care TIME TAKING CARE OF THIS PATIENT: 45  minutes.   Note: This dictation was prepared with Dragon dictation along with smaller phrase technology. Any transcriptional errors that result from this process are  unintentional.  Crystal Landry M.D on 05/28/2017 at 3:20 PM  Between 7am to 6pm - Pager - [phone]  After 6pm go to [email or web address] - password [email or web address]  Office  [phone]  CC: Primary care physician; [name], MD

## 2017-05-28 NOTE — Consult Note (Signed)
Surgical Consultation  05/28/2017  Crystal Landry is an 83 y.o. female.   Referring Physician: Dr Cinda Quest  CC: Nausea vomiting and diarrhea  HPI: This a patient with a several day history of nausea vomiting and diarrhea. She's had radiation to her pelvis for endometrial cancer in the past and has had loose stools periodically but over the last few weeks its increased considerably. Now she's vomiting several times a day. She denies any abdominal pain and no back pain that is new or unusual. She does have occasional back pain but states that's been going on for years. She is not sure she's lost any weight. A noncontrast CT was performed and Dr. Rip Harbour asked me to see the patient for possible duodenal obstruction. I reviewed those films personally.  Of significance is patient's past medical history, see below, but it includes anticoagulation for a mechanical valve.  Patient's son is present who is very helpful in providing additional medical information in history.  Past Medical History:  Diagnosis Date  . A-fib ([place])   . Anxiety   . Atrial fibrillation ([place])   . Cancer (H[facility])    endometrial  . Depression   . Hypertension     Past Surgical History:  Procedure Laterality Date  . ABDOMINAL HYSTERECTOMY    . MITRAL VALVE REPLACEMENT      Family History  Problem Relation Age of Onset  . Stroke Father   . Brain cancer Brother   . Colon cancer Brother   . Breast cancer Neg Hx     Social History:  reports that she has never smoked. She has never used smokeless tobacco. She reports that she does not drink alcohol or use drugs.  Allergies:  Allergies  Allergen Reactions  . Codeine Sulfate     Other reaction(s): Unknown  . Diphenhydramine Other (See Comments)    skin became red all over  . Ivp Dye [Iodinated Diagnostic Agents] Other (See Comments)    Skin becomes red all over  . Quinidine     Other reaction(s): Unknown Other reaction(s): Unknown    Medications  reviewed.   Review of Systems:   Review of Systems  Constitutional: Negative for chills, fever and weight loss.  HENT: Negative.   Eyes: Negative.   Respiratory: Negative.   Cardiovascular: Negative.   Gastrointestinal: Positive for diarrhea, nausea and vomiting. Negative for abdominal pain, blood in stool, constipation, heartburn and melena.  Genitourinary: Negative.   Musculoskeletal: Negative.   Skin: Negative.   Neurological: Negative.   Endo/Heme/Allergies: Negative.   Psychiatric/Behavioral: Negative.      Physical Exam:  BP 129/72   Pulse 83   Temp 97.6 F (36.4 C) (Axillary)   Resp 13   Ht 5' 2.5" (1.588 m)   Wt 133 lb (60.3 kg)   SpO2 96%   BMI 23.94 kg/m   Physical Exam  Constitutional: She is oriented to person, place, and time and well-developed, well-nourished, and in no distress. No distress.  Comfortable-appearing no distress  HENT:  Head: Normocephalic and atraumatic.  Eyes: Pupils are equal, round, and reactive to light. Right eye exhibits no discharge. Left eye exhibits no discharge. No scleral icterus.  Neck: Normal range of motion. No JVD present.  Cardiovascular: Normal rate and intact distal pulses.   Pulmonary/Chest: No respiratory distress.  Abdominal: Soft. She exhibits distension. There is no tenderness. There is no rebound and no guarding.  Greatly distended stomach area in the epigastrium with some tympany otherwise nontender. infraumbilical scars present  Musculoskeletal: Normal range of motion. She exhibits edema. She exhibits no tenderness.  Neurological: She is alert and oriented to person, place, and time.  Skin: Skin is warm and dry. No rash noted. She is not diaphoretic. No erythema.  Psychiatric: Mood and affect normal.  Vitals reviewed.     Results for orders placed or performed during the hospital encounter of 05/28/17 (from the past 48 hour(s))  Lipase, blood     Status: None   Collection Time: 05/28/17 12:12 PM  Result  Value Ref Range   Lipase 36 11 - 51 U/L  Comprehensive metabolic panel     Status: Abnormal   Collection Time: 05/28/17 12:12 PM  Result Value Ref Range   Sodium 138 135 - 145 mmol/L   Potassium 3.8 3.5 - 5.1 mmol/L   Chloride 97 (L) 101 - 111 mmol/L   CO2 29 22 - 32 mmol/L   Glucose, Bld 112 (H) 65 - 99 mg/dL   BUN 22 (H) 6 - 20 mg/dL   Creatinine, Ser 1.48 (H) 0.44 - 1.00 mg/dL   Calcium 9.7 8.9 - 10.3 mg/dL   Total Protein 7.6 6.5 - 8.1 g/dL   Albumin 4.5 3.5 - 5.0 g/dL   AST 33 15 - 41 U/L   ALT 17 14 - 54 U/L   Alkaline Phosphatase 75 38 - 126 U/L   Total Bilirubin 1.1 0.3 - 1.2 mg/dL   GFR calc non Af Amer 31 (L) >60 mL/min   GFR calc Af Amer 35 (L) >60 mL/min    Comment: (NOTE) The eGFR has been calculated using the CKD EPI equation. This calculation has not been validated in all clinical situations. eGFR's persistently <60 mL/min signify possible Chronic Kidney Disease.    Anion gap 12 5 - 15  CBC     Status: Abnormal   Collection Time: 05/28/17 12:12 PM  Result Value Ref Range   WBC 8.6 3.6 - 11.0 K/uL   RBC 4.08 3.80 - 5.20 MIL/uL   Hemoglobin 13.7 12.0 - 16.0 g/dL   HCT 39.8 35.0 - 47.0 %   MCV 97.6 80.0 - 100.0 fL   MCH 33.5 26.0 - 34.0 pg   MCHC 34.4 32.0 - 36.0 g/dL   RDW 14.8 (H) 11.5 - 14.5 %   Platelets 221 150 - 440 K/uL  Protime-INR     Status: Abnormal   Collection Time: 05/28/17  3:48 PM  Result Value Ref Range   Prothrombin Time 27.8 (H) 11.4 - 15.2 seconds   INR 2.62    Ct Abdomen Pelvis Wo Contrast  Result Date: 05/28/2017 CLINICAL DATA:  Abdominal pain, diarrhea and cramping for 2 months. EXAM: CT ABDOMEN AND PELVIS WITHOUT CONTRAST TECHNIQUE: Multidetector CT imaging of the abdomen and pelvis was performed following the standard protocol without IV contrast. COMPARISON:  CT scan 09/29/2014 FINDINGS: Lower chest: The lung bases are clear of acute process. Minimal basilar scarring changes. The heart is enlarged and there are it dense coronary  artery calcifications and dense aortic valve and mitral valve annular calcifications. Hepatobiliary: No focal hepatic lesions or intrahepatic biliary dilatation. The gallbladder is mildly distended and small layering gallstones are noted. No pericholecystic inflammatory changes or gallbladder wall thickening to suggest acute cholecystitis. Normal caliber common bile duct for age. Pancreas: Moderate pancreatic atrophy but no mass, inflammation or ductal dilatation. Spleen: Normal size.  No focal lesions. Adrenals/Urinary Tract: The adrenal glands are unremarkable and stable. Mild left-sided hydronephrosis and proximal left hydroureter but no  obstructing ureteral calculi are identified. Possible recent stone passage and/or mild stricture. New line the right kidney is unremarkable. No renal or right ureteral calculi. Asymmetric bladder wall thickening appears relatively stable and could be due to chronic cystitis. I do not see a discrete mass. There is a right-sided diverticulum noted. Stomach/Bowel: Markedly distended stomach with fluid and air. The duodenum is also distended. Apparent wall thickening involving the third portion the duodenum could be an inflammatory process. I do not see a discrete mass but an infiltrating lesion is possible. Endoscopy may be necessary for further evaluation. No findings to suggest a mid distal small bowel obstruction. The terminal ileum appears normal. The appendix is normal except for a few small appendicoliths. Vascular/Lymphatic: Advanced vascular calcifications but no focal aneurysm. No mesenteric or retroperitoneal mass or lymphadenopathy. Small scattered lymph nodes appears stable. Reproductive: Surgically absent. Other: No pelvic mass or adenopathy. No free pelvic fluid collections. No inguinal mass or adenopathy. No abdominal wall hernia or subcutaneous lesions. Musculoskeletal: Advanced degenerative changes involving the spine but no acute bony findings or worrisome bone  lesions. Diffuse osteoporosis. IMPRESSION: 1. Markedly distended stomach and duodenum with apparent wall thickening involving the third portion the duodenum. Could not exclude a functional obstruction due to inflammation. No discrete mass but infiltrating tumor is possible. Endoscopy may be helpful for further evaluation. 2. No findings for small bowel obstruction or free air. 3. Cholelithiasis without definite findings for acute cholecystitis. 4. Advanced atherosclerotic calcifications involving the aorta and branch vessels. 5. Mild left-sided hydronephrosis and proximal left hydroureter without obvious cause. 6. Moderate bladder wall thickening with some asymmetry but no discrete mass. This appears relatively stable and could be due to chronic cystitis. Small right-sided bladder diverticulum noted. Electronically Signed   By: Marijo Sanes M.D.   On: 05/28/2017 13:49    Assessment/Plan:  CT scan is personally reviewed. There is sign of a probable duodenal obstruction. The report suggests that this is inflammatory but in a noncontrasted study and due to the location of this it is suspicious for a tumor either of the duodenum or pancreas. Additional workup will be necessary.  I suggested to Dr. Rip Harbour that a nasogastric tube be placed no tube has been placed as of yet but I discussed that with the emergency room physician caring for the patient at this time. Patient will also require a workup including GI consult for EGD.  Florene Glen, MD, FACS

## 2017-05-28 NOTE — Progress Notes (Signed)
Patient feeling anxious and restless, says she takes xanax sometimes at home. Patient aware that she cannot take PO at this time. MD paged, order for 0.5mg  ativan IV q8h PRN.

## 2017-05-28 NOTE — Progress Notes (Signed)
[facility] for Warfarin Indication: atrial fibrillation  Allergies  Allergen Reactions  . Codeine Sulfate     Other reaction(s): Unknown  . Diphenhydramine Other (See Comments)    skin became red all over  . Ivp Dye [Iodinated Diagnostic Agents] Other (See Comments)    Skin becomes red all over  . Quinidine     Other reaction(s): Unknown Other reaction(s): Unknown    Patient Measurements: Height: 5' 2.5" (158.8 cm) Weight: 133 lb (60.3 kg) IBW/kg (Calculated) : 51.25  Vital Signs: Temp: 97.6 F (36.4 C) (08/30 1210) Temp Source: Axillary (08/30 1210) BP: 129/72 (08/30 1611) Pulse Rate: 83 (08/30 1611)  Labs:  Recent Labs  05/28/17 1212 05/28/17 1548  HGB 13.7  --   HCT 39.8  --   PLT 221  --   LABPROT  --  27.8*  INR  --  2.62  CREATININE 1.48*  --     Estimated Creatinine Clearance: 21.7 mL/min (A) (by C-G formula based on SCr of 1.48 mg/dL (H)).   Medical History: Past Medical History:  Diagnosis Date  . A-fib ([place])   . Anxiety   . Atrial fibrillation ([place])   . Cancer (HCC)    endometrial  . Depression   . Hypertension    Assessment: 83 y/o F with a known h/o atrial fibrillation on warfarin 4 mg daily PTA admitted with abdominal pain as well as intractable N/V.   Goal of Therapy:  INR 2-3   Plan:  INR is therapeutic. Patient is unable to tolerate oral medications at this time. Dr. Margaretmary Eddy tentatively plans for patient to begin therapeutic Lovenox tomorrow if INR is subtherapeutic. Will need to f/u plans for anticoagulation in AM.   Ulice Dash D 05/28/2017,4:45 PM

## 2017-05-28 NOTE — Progress Notes (Signed)
Patient admitted to unit. Oriented to room, call bell, and staff. Bed in lowest position. Fall safety plan reviewed. Full assessment to Epic. Skin assessment verified with Lucina Mellow. Telemetry box verification with tele clerk- Box#: [id number]. Will continue to monitor.

## 2017-05-29 DIAGNOSIS — I4891 Unspecified atrial fibrillation: Secondary | ICD-10-CM

## 2017-05-29 DIAGNOSIS — Z0189 Encounter for other specified special examinations: Secondary | ICD-10-CM

## 2017-05-29 DIAGNOSIS — R938 Abnormal findings on diagnostic imaging of other specified body structures: Secondary | ICD-10-CM

## 2017-05-29 DIAGNOSIS — K56609 Unspecified intestinal obstruction, unspecified as to partial versus complete obstruction: Secondary | ICD-10-CM

## 2017-05-29 LAB — COMPREHENSIVE METABOLIC PANEL
ALBUMIN: 3.7 g/dL (ref 3.5–5.0)
ALK PHOS: 61 U/L (ref 38–126)
ALT: 14 U/L (ref 14–54)
AST: 22 U/L (ref 15–41)
Anion gap: 10 (ref 5–15)
BUN: 20 mg/dL (ref 6–20)
CHLORIDE: 102 mmol/L (ref 101–111)
CO2: 27 mmol/L (ref 22–32)
Calcium: 8.6 mg/dL — ABNORMAL LOW (ref 8.9–10.3)
Creatinine, Ser: 1.39 mg/dL — ABNORMAL HIGH (ref 0.44–1.00)
GFR calc non Af Amer: 33 mL/min — ABNORMAL LOW (ref >60)
GFR, EST AFRICAN AMERICAN: 38 mL/min — AB (ref >60)
GLUCOSE: 94 mg/dL (ref 65–99)
Potassium: 3.6 mmol/L (ref 3.5–5.1)
SODIUM: 139 mmol/L (ref 135–145)
Total Bilirubin: 1.1 mg/dL (ref 0.3–1.2)
Total Protein: 6.2 g/dL — ABNORMAL LOW (ref 6.5–8.1)

## 2017-05-29 LAB — CBC
HCT: 37.8 % (ref 35.0–47.0)
Hemoglobin: 12.6 g/dL (ref 12.0–16.0)
MCH: 32.5 pg (ref 26.0–34.0)
MCHC: 33.3 g/dL (ref 32.0–36.0)
MCV: 97.7 fL (ref 80.0–100.0)
Platelets: 188 10*3/uL (ref 150–440)
RBC: 3.87 MIL/uL (ref 3.80–5.20)
RDW: 14.9 % — ABNORMAL HIGH (ref 11.5–14.5)
WBC: 9.3 10*3/uL (ref 3.6–11.0)

## 2017-05-29 LAB — PROTIME-INR
INR: 3.1
Prothrombin Time: 31.7 seconds — ABNORMAL HIGH (ref 11.4–15.2)

## 2017-05-29 LAB — TSH: TSH: 1.164 u[IU]/mL (ref 0.350–4.500)

## 2017-05-29 MED ORDER — TEMAZEPAM 7.5 MG PO CAPS
7.5000 mg | ORAL_CAPSULE | Freq: Every evening | ORAL | Status: DC | PRN
  Administered 2017-05-29 – 2017-06-10 (×13): 7.5 mg via ORAL
  Filled 2017-05-29 (×13): qty 1

## 2017-05-29 MED ORDER — POTASSIUM CHLORIDE IN NACL 20-0.9 MEQ/L-% IV SOLN
INTRAVENOUS | Status: DC
  Administered 2017-05-29 – 2017-05-31 (×3): via INTRAVENOUS
  Filled 2017-05-29 (×6): qty 1000

## 2017-05-29 MED ORDER — TRAZODONE HCL 50 MG PO TABS: 50.0000 mg | ORAL_TABLET | Freq: Every evening | ORAL | Status: DC | PRN

## 2017-05-29 MED ORDER — LIDOCAINE VISCOUS 2 % MT SOLN
15.0000 mL | OROMUCOSAL | Status: DC | PRN
  Administered 2017-05-29 (×2): 15 mL via OROMUCOSAL
  Filled 2017-05-29 (×3): qty 15

## 2017-05-29 NOTE — Care Management (Signed)
Admitted from home.  Sent to ED from PCP office for concerns of small bowel obstruction.  Has been assessed by surgery and has nasogastric tube and npo.  Patient is also on a cardizem infusion for rapid atrial fib.  GI consult is pending.  Chronic coumadin

## 2017-05-29 NOTE — Consult Note (Signed)
[name] MD, MRCP(U.K) [address]  [place]  [place], [place] [address]  Main: [phone]  Fax: [phone]  Consultation  Referring Provider:   Dr Leslye Peer Primary Care Physician:  [name], MD Primary Gastroenterologist:  None         Reason for Consultation:   Duodenal obstruction   Date of Admission:  05/28/2017 Date of Consultation:  05/29/2017         HPI:   Crystal Landry is a 83 y.o. female has presented to the hospital yesterday with nausea,vomiting and diarrhea for a few days.   A ct scan done in the abdomen showed marked distension of the stomach , duodenum with wall thickening of the third portion , no discrete mass seen . Hb 12.6. INR 3.10 as she is on coumadin for a mechanical heart valve.    She has had issues with bloating, abdominal distension, nausea ongoing for about a month , has been to urgent care a few times . Since two days had nausea and vomiting which brought her into the ER. Since has had a NG tube placed and feels much better, Did have some diarrhea prior to coming into the hospital that has stopped. Lost 4 lbs weight , denies any NSAID use.   Past Medical History:  Diagnosis Date  . A-fib ([place])   . Anxiety   . Atrial fibrillation ([name])   . Cancer (HCC)    endometrial  . Depression   . Hypertension     Past Surgical History:  Procedure Laterality Date  . ABDOMINAL HYSTERECTOMY    . MITRAL VALVE REPLACEMENT      Prior to Admission medications   Medication Sig Start Date End Date Taking? Authorizing Provider  acetaminophen (TYLENOL) 325 MG tablet Take 650 mg by mouth every 4 (four) hours as needed for mild pain (or temp greater than 100.4).   Yes [provider]  ALPRAZolam Duanne Moron) 0.5 MG tablet Take 0.5 mg by mouth 2 (two) times daily as needed for anxiety.    Yes [provider]  Ascorbic Acid (VITAMIN C) 1000 MG tablet Take 500 mg by mouth daily.    Yes [provider]  benazepril  (LOTENSIN) 20 MG tablet Take 20 mg by mouth daily.   Yes [provider]  diltiazem (CARDIZEM) 60 MG tablet Take 120 mg by mouth daily as needed (for increased heart rate).   Yes [provider]  diltiazem (CARTIA XT) 300 MG 24 hr capsule take 1 capsule by mouth once daily 08/11/16  Yes [provider]  furosemide (LASIX) 20 MG tablet Take 20 mg by mouth daily.   Yes [provider]  glucosamine-chondroitin 500-400 MG tablet Take 1 tablet by mouth 3 (three) times daily.   Yes [provider]  Multiple Vitamin (MULTI-VITAMINS) TABS Take 1 tablet by mouth daily.    Yes [provider]  simvastatin (ZOCOR) 20 MG tablet Take 20 mg by mouth at bedtime.   Yes [provider]  temazepam (RESTORIL) 15 MG capsule Take 15 mg by mouth at bedtime as needed for sleep.   Yes [provider]  warfarin (COUMADIN) 4 MG tablet take 1 tablet by mouth once daily 05/12/16  Yes [provider]    Family History  Problem Relation Age of Onset  . Stroke Father   . Brain cancer Brother   . Colon cancer Brother   . Breast cancer Neg Hx      Social History  Substance Use Topics  . Smoking status: Never Smoker  . Smokeless tobacco: Never Used  . Alcohol use No    Allergies as of 05/28/2017 - Review Complete 05/28/2017  Allergen Reaction Noted  . Codeine sulfate  02/21/2015  . Diphenhydramine Other (See Comments) 01/22/2015  . Ivp dye [iodinated diagnostic agents] Other (See Comments) 01/22/2015  . Quinidine  02/21/2015    Review of Systems:    All systems reviewed and negative except where noted in HPI.   Physical Exam:  Vital signs in last 24 hours: Temp:  [97.6 F (36.4 C)-98.2 F (36.8 C)] 97.9 F (36.6 C) (08/31 0930) Pulse Rate:  [39-83] 63 (08/31 0930) Resp:  [13-20] 18 (08/31 0321) BP: (115-141)/(54-102) 133/55 (08/31 0930) SpO2:  [93 %-99 %] 93 % (08/31 0930) Weight:  [133 lb (60.3 kg)] 133 lb (60.3 kg) (08/30  1211) Last BM Date: 05/28/17 General:   Pleasant, cooperative in NAD,NG tube in place  Head:  Normocephalic and atraumatic. Eyes:   No icterus.   Conjunctiva pink. PERRLA. Ears:  Normal auditory acuity. Neck:  Supple; no masses or thyroidomegaly Lungs: Respirations even and unlabored. Lungs clear to auscultation bilaterally.   No wheezes, crackles, or rhonchi.  Heart:  Regular rate and rhythm;  Without murmur, rubs or gallops.Metallic valve click heard Abdomen:  Soft, nondistended, nontender. Normal bowel sounds. No appreciable masses or hepatomegaly.  No rebound or guarding.  Rectal:  Not performed. Neurologic:  Alert and oriented x3;  grossly normal neurologically. Skin:  Intact without significant lesions or rashes. Cervical Nodes:  No significant cervical adenopathy. Psych:  Alert and cooperative. Normal affect.  LAB RESULTS:  Recent Labs  05/28/17 1212 05/29/17 0416  WBC 8.6 9.3  HGB 13.7 12.6  HCT 39.8 37.8  PLT 221 188   BMET  Recent Labs  05/28/17 1212 05/29/17 0416  NA 138 139  K 3.8 3.6  CL 97* 102  CO2 29 27  GLUCOSE 112* 94  BUN 22* 20  CREATININE 1.48* 1.39*  CALCIUM 9.7 8.6*   LFT  Recent Labs  05/29/17 0416  PROT 6.2*  ALBUMIN 3.7  AST 22  ALT 14  ALKPHOS 61  BILITOT 1.1   PT/INR  Recent Labs  05/28/17 1548 05/29/17 0416  LABPROT 27.8* 31.7*  INR 2.62 3.10    STUDIES: Ct Abdomen Pelvis Wo Contrast  Result Date: 05/28/2017 CLINICAL DATA:  Abdominal pain, diarrhea and cramping for 2 months. EXAM: CT ABDOMEN AND PELVIS WITHOUT CONTRAST TECHNIQUE: Multidetector CT imaging of the abdomen and pelvis was performed following the standard protocol without IV contrast. COMPARISON:  CT scan 09/29/2014 FINDINGS: Lower chest: The lung bases are clear of acute process. Minimal basilar scarring changes. The heart is enlarged and there are it dense coronary artery calcifications and dense aortic valve and mitral valve annular calcifications.  Hepatobiliary: No focal hepatic lesions or intrahepatic biliary dilatation. The gallbladder is mildly distended and small layering gallstones are noted. No pericholecystic inflammatory changes or gallbladder wall thickening to suggest acute cholecystitis. Normal caliber common bile duct for age. Pancreas: Moderate pancreatic atrophy but no mass, inflammation or ductal dilatation. Spleen: Normal size.  No focal lesions. Adrenals/Urinary Tract: The adrenal glands are unremarkable and stable. Mild left-sided hydronephrosis and proximal left hydroureter but no obstructing ureteral calculi are identified. Possible recent stone passage and/or mild stricture. New line the right kidney is unremarkable. No renal or right ureteral calculi. Asymmetric bladder wall thickening appears relatively stable and could be due to chronic cystitis.  I do not see a discrete mass. There is a right-sided diverticulum noted. Stomach/Bowel: Markedly distended stomach with fluid and air. The duodenum is also distended. Apparent wall thickening involving the third portion the duodenum could be an inflammatory process. I do not see a discrete mass but an infiltrating lesion is possible. Endoscopy may be necessary for further evaluation. No findings to suggest a mid distal small bowel obstruction. The terminal ileum appears normal. The appendix is normal except for a few small appendicoliths. Vascular/Lymphatic: Advanced vascular calcifications but no focal aneurysm. No mesenteric or retroperitoneal mass or lymphadenopathy. Small scattered lymph nodes appears stable. Reproductive: Surgically absent. Other: No pelvic mass or adenopathy. No free pelvic fluid collections. No inguinal mass or adenopathy. No abdominal wall hernia or subcutaneous lesions. Musculoskeletal: Advanced degenerative changes involving the spine but no acute bony findings or worrisome bone lesions. Diffuse osteoporosis. IMPRESSION: 1. Markedly distended stomach and duodenum  with apparent wall thickening involving the third portion the duodenum. Could not exclude a functional obstruction due to inflammation. No discrete mass but infiltrating tumor is possible. Endoscopy may be helpful for further evaluation. 2. No findings for small bowel obstruction or free air. 3. Cholelithiasis without definite findings for acute cholecystitis. 4. Advanced atherosclerotic calcifications involving the aorta and branch vessels. 5. Mild left-sided hydronephrosis and proximal left hydroureter without obvious cause. 6. Moderate bladder wall thickening with some asymmetry but no discrete mass. This appears relatively stable and could be due to chronic cystitis. Small right-sided bladder diverticulum noted. Electronically Signed   By: Marijo Sanes M.D.   On: 05/28/2017 13:49   Dg Chest 1 View  Result Date: 05/28/2017 CLINICAL DATA:  NG tube placement EXAM: CHEST 1 VIEW COMPARISON:  11/28/2015 FINDINGS: Post sternotomy changes. Esophageal tube extends below the diaphragm but the tip is non included. Elevation of left diaphragm. Mild cardiomegaly. Aortic atherosclerosis. Negative for pneumothorax or pleural effusion IMPRESSION: Esophageal tube tip extends below diaphragm but tip is non included. Cardiomegaly. Electronically Signed   By: Donavan Foil M.D.   On: 05/28/2017 18:18   Dg Abd 1 View  Result Date: 05/28/2017 CLINICAL DATA:  Evaluate NG tube EXAM: ABDOMEN - 1 VIEW COMPARISON:  None. FINDINGS: The side port of the NG tube is just below the GE junction with the distal tip in the body of the stomach. IMPRESSION: The side port of the NG tube is just below the GE junction with the distal tip in the body of the stomach. Electronically Signed   By: Dorise Bullion III M.D   On: 05/28/2017 19:15      Impression / Plan:   Crystal Landry is a 83 y.o. y/o female admitted with nausea, vomiting and diarrhea. CT scan concerning for duodenal obstruction, on a NG tube. Diarrhea stopped    Plan    1. Will need EGD when INR is lower, will likely need bridging with IV heparin for the procedure. Likely tomorrow or Sunday with Dr Michail Sermon. I will sign out to him    I have discussed alternative options, risks & benefits,  which include, but are not limited to, bleeding, infection, perforation,respiratory complication & drug reaction.  The patient agrees with this plan & written consent will be obtained.     Thank you for involving me in the care of this patient.      LOS: 1 day   [name], MD  05/29/2017, 10:04 AM

## 2017-05-29 NOTE — Progress Notes (Signed)
Patient ID: Crystal Landry, female   DOB: Jan 28, 1927, 83 y.o.   MRN: [id number]  [facility] PROGRESS NOTE  Crystal Landry:[id number] DOB: July 05, 1927 DOA: 05/28/2017 PCP: Crystal Harrier, MD  HPI/Subjective: Patient very uncomfortable with the NG tube. Family was upset that there was no gastroenterology coverage until 7 PM tonight and consultation was not called yesterday. I called GI and they were able to come see the patient today. No nausea and vomiting. No abdominal pain. Had bowel movements yesterday.  Objective: Vitals:   05/29/17 0930 05/29/17 1421  BP: (!) 133/55 (!) 124/56  Pulse: 63 86  Resp:  14  Temp: 97.9 F (36.6 C) 98.2 F (36.8 C)  SpO2: 93% 93%    Filed Weights   05/28/17 1211  Weight: 60.3 kg (133 lb)    ROS: Review of Systems  Constitutional: Negative for chills and fever.  Eyes: Negative for blurred vision.  Respiratory: Negative for cough and shortness of breath.   Cardiovascular: Negative for chest pain.  Gastrointestinal: Negative for abdominal pain, constipation, diarrhea, nausea and vomiting.  Genitourinary: Negative for dysuria.  Musculoskeletal: Negative for joint pain.  Neurological: Negative for dizziness and headaches.   Exam: Physical Exam  Constitutional: She is oriented to person, place, and time.  HENT:  Nose: No mucosal edema.  Mouth/Throat: No oropharyngeal exudate or posterior oropharyngeal edema.  Eyes: Pupils are equal, round, and reactive to light. Conjunctivae, EOM and lids are normal.  Neck: No JVD present. Carotid bruit is not present. No edema present. No thyroid mass and no thyromegaly present.  Cardiovascular: S1 normal and S2 normal.  Exam reveals no gallop.   Murmur heard.  Systolic murmur is present with a grade of 4/6  Pulses:      Dorsalis pedis pulses are 2+ on the right side, and 2+ on the left side.  Metallic heart valve  Respiratory: No respiratory distress. She has no wheezes. She has no  rhonchi. She has no rales.  GI: Soft. Bowel sounds are normal. There is no tenderness.  Musculoskeletal:       Right shoulder: She exhibits no swelling.  Lymphadenopathy:    She has no cervical adenopathy.  Neurological: She is alert and oriented to person, place, and time. No cranial nerve deficit.  Skin: Skin is warm. No rash noted. Nails show no clubbing.  Psychiatric: She has a normal mood and affect.      Data Reviewed: Basic Metabolic Panel:  Recent Labs Lab 05/28/17 1212 05/29/17 0416  NA 138 139  K 3.8 3.6  CL 97* 102  CO2 29 27  GLUCOSE 112* 94  BUN 22* 20  CREATININE 1.48* 1.39*  CALCIUM 9.7 8.6*   Liver Function Tests:  Recent Labs Lab 05/28/17 1212 05/29/17 0416  AST 33 22  ALT 17 14  ALKPHOS 75 61  BILITOT 1.1 1.1  PROT 7.6 6.2*  ALBUMIN 4.5 3.7    Recent Labs Lab 05/28/17 1212  LIPASE 36   CBC:  Recent Labs Lab 05/28/17 1212 05/29/17 0416  WBC 8.6 9.3  HGB 13.7 12.6  HCT 39.8 37.8  MCV 97.6 97.7  PLT 221 188      Studies: Ct Abdomen Pelvis Wo Contrast  Result Date: 05/28/2017 CLINICAL DATA:  Abdominal pain, diarrhea and cramping for 2 months. EXAM: CT ABDOMEN AND PELVIS WITHOUT CONTRAST TECHNIQUE: Multidetector CT imaging of the abdomen and pelvis was performed following the standard protocol without IV contrast. COMPARISON:  CT scan 09/29/2014 FINDINGS: Lower chest:  The lung bases are clear of acute process. Minimal basilar scarring changes. The heart is enlarged and there are it dense coronary artery calcifications and dense aortic valve and mitral valve annular calcifications. Hepatobiliary: No focal hepatic lesions or intrahepatic biliary dilatation. The gallbladder is mildly distended and small layering gallstones are noted. No pericholecystic inflammatory changes or gallbladder wall thickening to suggest acute cholecystitis. Normal caliber common bile duct for age. Pancreas: Moderate pancreatic atrophy but no mass, inflammation or  ductal dilatation. Spleen: Normal size.  No focal lesions. Adrenals/Urinary Tract: The adrenal glands are unremarkable and stable. Mild left-sided hydronephrosis and proximal left hydroureter but no obstructing ureteral calculi are identified. Possible recent stone passage and/or mild stricture. New line the right kidney is unremarkable. No renal or right ureteral calculi. Asymmetric bladder wall thickening appears relatively stable and could be due to chronic cystitis. I do not see a discrete mass. There is a right-sided diverticulum noted. Stomach/Bowel: Markedly distended stomach with fluid and air. The duodenum is also distended. Apparent wall thickening involving the third portion the duodenum could be an inflammatory process. I do not see a discrete mass but an infiltrating lesion is possible. Endoscopy may be necessary for further evaluation. No findings to suggest a mid distal small bowel obstruction. The terminal ileum appears normal. The appendix is normal except for a few small appendicoliths. Vascular/Lymphatic: Advanced vascular calcifications but no focal aneurysm. No mesenteric or retroperitoneal mass or lymphadenopathy. Small scattered lymph nodes appears stable. Reproductive: Surgically absent. Other: No pelvic mass or adenopathy. No free pelvic fluid collections. No inguinal mass or adenopathy. No abdominal wall hernia or subcutaneous lesions. Musculoskeletal: Advanced degenerative changes involving the spine but no acute bony findings or worrisome bone lesions. Diffuse osteoporosis. IMPRESSION: 1. Markedly distended stomach and duodenum with apparent wall thickening involving the third portion the duodenum. Could not exclude a functional obstruction due to inflammation. No discrete mass but infiltrating tumor is possible. Endoscopy may be helpful for further evaluation. 2. No findings for small bowel obstruction or free air. 3. Cholelithiasis without definite findings for acute cholecystitis. 4.  Advanced atherosclerotic calcifications involving the aorta and branch vessels. 5. Mild left-sided hydronephrosis and proximal left hydroureter without obvious cause. 6. Moderate bladder wall thickening with some asymmetry but no discrete mass. This appears relatively stable and could be due to chronic cystitis. Small right-sided bladder diverticulum noted. Electronically Signed   By: Marijo Sanes M.D.   On: 05/28/2017 13:49   Dg Chest 1 View  Result Date: 05/28/2017 CLINICAL DATA:  NG tube placement EXAM: CHEST 1 VIEW COMPARISON:  11/28/2015 FINDINGS: Post sternotomy changes. Esophageal tube extends below the diaphragm but the tip is non included. Elevation of left diaphragm. Mild cardiomegaly. Aortic atherosclerosis. Negative for pneumothorax or pleural effusion IMPRESSION: Esophageal tube tip extends below diaphragm but tip is non included. Cardiomegaly. Electronically Signed   By: Donavan Foil M.D.   On: 05/28/2017 18:18   Dg Abd 1 View  Result Date: 05/28/2017 CLINICAL DATA:  Evaluate NG tube EXAM: ABDOMEN - 1 VIEW COMPARISON:  None. FINDINGS: The side port of the NG tube is just below the GE junction with the distal tip in the body of the stomach. IMPRESSION: The side port of the NG tube is just below the GE junction with the distal tip in the body of the stomach. Electronically Signed   By: Dorise Bullion III M.D   On: 05/28/2017 19:15    Scheduled Meds: . pantoprazole (PROTONIX) IV  40 mg  Intravenous Q12H   Continuous Infusions: . sodium chloride 75 mL/hr at 05/29/17 0704  . diltiazem (CARDIZEM) infusion 5 mg/hr (05/29/17 0703)    Assessment/Plan:  1. Atrial fibrillation with rapid ventricular response. Better on Cardizem drip. 2. Duodenal obstruction. Case discussed with gastroenterology. Essential endoscopy tomorrow if INR is a little bit lower. IV Protonix. NG tube. Viscous lidocaine for NG tube pain 3. Acute kidney injury. On IV fluid hydration. Add potassium to  fluids. 4. Left-sided hydronephrosis and hydroureter. Urology on call yesterday recommended outpatient follow-up 5. Hyperlipidemia unspecified. With NG tube and limited with medications 6. Metallic heart valve. Coumadin on hold. Will need heparin drip once INR closer to 2.  Code Status:     Code Status Orders        Start     Ordered   05/28/17 1732  Full code  Continuous     05/28/17 1732    Code Status History    Date Active Date Inactive Code Status Order ID Comments User Context   This patient has a current code status but no historical code status.    Advance Directive Documentation     Most Recent Value  Type of Advance Directive  Healthcare Power of Attorney  Pre-existing out of facility DNR order (yellow form or pink MOST form)  -  "MOST" Form in Place?  -     Family Communication: Son at the bedside Disposition Plan: Must be able to tolerate solid food prior to disposition  Consultants:  Gastroenterology  Surgery  Cardiology  Time spent: 35 minutes  Loletha Grayer  [employer]

## 2017-05-29 NOTE — Progress Notes (Signed)
CC: Pain from nasogastric tube Subjective: Patient problem now is pain from her nasogastric tube in the back of her throat. She has multiple complaints concerning that but states that her abdomen feels much better she has no abdominal pain no nausea vomiting she is not passing gas. A different son is present this morning than was present in the ED and her condition was discussed with him. She has not yet been seen by GI.  Objective: Vital signs in last 24 hours: Temp:  [97.6 F (36.4 C)-98.2 F (36.8 C)] 97.9 F (36.6 C) (08/31 0321) Pulse Rate:  [39-83] 73 (08/31 0407) Resp:  [13-20] 18 (08/31 0321) BP: (115-141)/(54-102) 127/65 (08/31 0321) SpO2:  [93 %-99 %] 93 % (08/31 0321) Weight:  [133 lb (60.3 kg)] 133 lb (60.3 kg) (08/30 1211) Last BM Date: 05/28/17  Intake/Output from previous day: 08/30 0701 - 08/31 0700 In: 847.3 [I.V.:847.3] Out: 600 [Urine:200; Emesis/NG output:400] Intake/Output this shift: No intake/output data recorded.  Physical exam:  Awake alert and oriented vital signs are stable and afebrile and personally reviewed.  Abdomen is soft nondistended and only minimally tympanitic and nontender. Nasogastric tube has resulted in much less distention. Calves are nontender no icterus no jaundice  Lab Results: CBC   Recent Labs  05/28/17 1212 05/29/17 0416  WBC 8.6 9.3  HGB 13.7 12.6  HCT 39.8 37.8  PLT 221 188   BMET  Recent Labs  05/28/17 1212 05/29/17 0416  NA 138 139  K 3.8 3.6  CL 97* 102  CO2 29 27  GLUCOSE 112* 94  BUN 22* 20  CREATININE 1.48* 1.39*  CALCIUM 9.7 8.6*   PT/INR  Recent Labs  05/28/17 1548 05/29/17 0416  LABPROT 27.8* 31.7*  INR 2.62 3.10   ABG No results for input(s): PHART, HCO3 in the last 72 hours.  Invalid input(s): PCO2, PO2  Studies/Results: Ct Abdomen Pelvis Wo Contrast  Result Date: 05/28/2017 CLINICAL DATA:  Abdominal pain, diarrhea and cramping for 2 months. EXAM: CT ABDOMEN AND PELVIS WITHOUT  CONTRAST TECHNIQUE: Multidetector CT imaging of the abdomen and pelvis was performed following the standard protocol without IV contrast. COMPARISON:  CT scan 09/29/2014 FINDINGS: Lower chest: The lung bases are clear of acute process. Minimal basilar scarring changes. The heart is enlarged and there are it dense coronary artery calcifications and dense aortic valve and mitral valve annular calcifications. Hepatobiliary: No focal hepatic lesions or intrahepatic biliary dilatation. The gallbladder is mildly distended and small layering gallstones are noted. No pericholecystic inflammatory changes or gallbladder wall thickening to suggest acute cholecystitis. Normal caliber common bile duct for age. Pancreas: Moderate pancreatic atrophy but no mass, inflammation or ductal dilatation. Spleen: Normal size.  No focal lesions. Adrenals/Urinary Tract: The adrenal glands are unremarkable and stable. Mild left-sided hydronephrosis and proximal left hydroureter but no obstructing ureteral calculi are identified. Possible recent stone passage and/or mild stricture. New line the right kidney is unremarkable. No renal or right ureteral calculi. Asymmetric bladder wall thickening appears relatively stable and could be due to chronic cystitis. I do not see a discrete mass. There is a right-sided diverticulum noted. Stomach/Bowel: Markedly distended stomach with fluid and air. The duodenum is also distended. Apparent wall thickening involving the third portion the duodenum could be an inflammatory process. I do not see a discrete mass but an infiltrating lesion is possible. Endoscopy may be necessary for further evaluation. No findings to suggest a mid distal small bowel obstruction. The terminal ileum appears normal. The appendix  is normal except for a few small appendicoliths. Vascular/Lymphatic: Advanced vascular calcifications but no focal aneurysm. No mesenteric or retroperitoneal mass or lymphadenopathy. Small scattered lymph  nodes appears stable. Reproductive: Surgically absent. Other: No pelvic mass or adenopathy. No free pelvic fluid collections. No inguinal mass or adenopathy. No abdominal wall hernia or subcutaneous lesions. Musculoskeletal: Advanced degenerative changes involving the spine but no acute bony findings or worrisome bone lesions. Diffuse osteoporosis. IMPRESSION: 1. Markedly distended stomach and duodenum with apparent wall thickening involving the third portion the duodenum. Could not exclude a functional obstruction due to inflammation. No discrete mass but infiltrating tumor is possible. Endoscopy may be helpful for further evaluation. 2. No findings for small bowel obstruction or free air. 3. Cholelithiasis without definite findings for acute cholecystitis. 4. Advanced atherosclerotic calcifications involving the aorta and branch vessels. 5. Mild left-sided hydronephrosis and proximal left hydroureter without obvious cause. 6. Moderate bladder wall thickening with some asymmetry but no discrete mass. This appears relatively stable and could be due to chronic cystitis. Small right-sided bladder diverticulum noted. Electronically Signed   By: Marijo Sanes M.D.   On: 05/28/2017 13:49   Dg Chest 1 View  Result Date: 05/28/2017 CLINICAL DATA:  NG tube placement EXAM: CHEST 1 VIEW COMPARISON:  11/28/2015 FINDINGS: Post sternotomy changes. Esophageal tube extends below the diaphragm but the tip is non included. Elevation of left diaphragm. Mild cardiomegaly. Aortic atherosclerosis. Negative for pneumothorax or pleural effusion IMPRESSION: Esophageal tube tip extends below diaphragm but tip is non included. Cardiomegaly. Electronically Signed   By: Donavan Foil M.D.   On: 05/28/2017 18:18   Dg Abd 1 View  Result Date: 05/28/2017 CLINICAL DATA:  Evaluate NG tube EXAM: ABDOMEN - 1 VIEW COMPARISON:  None. FINDINGS: The side port of the NG tube is just below the GE junction with the distal tip in the body of the  stomach. IMPRESSION: The side port of the NG tube is just below the GE junction with the distal tip in the body of the stomach. Electronically Signed   By: Dorise Bullion III M.D   On: 05/28/2017 19:15    Anti-infectives: Anti-infectives    None      Assessment/Plan:  Labs are reviewed creatinine remains elevated.  Nasogastric tube has decompress the stomach but further diagnostic maneuvers need to be performed including a likely EGD to see if that third portion of the duodenum can be visualized. Awaiting GI consult. No surgical needs at this time.  Florene Glen, MD, FACS  05/29/2017

## 2017-05-29 NOTE — Progress Notes (Addendum)
[facility] for Warfarin Indication: atrial fibrillation  Allergies  Allergen Reactions  . Codeine Sulfate     Other reaction(s): Unknown  . Diphenhydramine Other (See Comments)    skin became red all over  . Ivp Dye [Iodinated Diagnostic Agents] Other (See Comments)    Skin becomes red all over  . Quinidine     Other reaction(s): Unknown Other reaction(s): Unknown    Patient Measurements: Height: 5' 2.5" (158.8 cm) Weight: 133 lb (60.3 kg) IBW/kg (Calculated) : 51.25  Vital Signs: Temp: 97.9 F (36.6 C) (08/31 0930) Temp Source: Oral (08/31 0930) BP: 133/55 (08/31 0930) Pulse Rate: 63 (08/31 0930)  Labs:  Recent Labs  05/28/17 1212 05/28/17 1548 05/29/17 0416  HGB 13.7  --  12.6  HCT 39.8  --  37.8  PLT 221  --  188  LABPROT  --  27.8* 31.7*  INR  --  2.62 3.10  CREATININE 1.48*  --  1.39*    Estimated Creatinine Clearance: 23.1 mL/min (A) (by C-G formula based on SCr of 1.39 mg/dL (H)).   Medical History: Past Medical History:  Diagnosis Date  . A-fib ([place])   . Anxiety   . Atrial fibrillation ([place])   . Cancer ([facility])    endometrial  . Depression   . Hypertension    Assessment: 83 y/o F with a known h/o atrial fibrillation on warfarin 4 mg daily PTA admitted with abdominal pain as well as intractable N/V. Last reported dose of warfarin 4mg  was 8/29.  Date INR 8/30 2.62 - held warfarin 8/31 3.1 - hold warfarin  Goal of Therapy:  INR 2-3   Plan:  8/30 INR is therapeutic. Patient is unable to tolerate oral medications at this time. Dr. Margaretmary Eddy tentatively plans for patient to begin therapeutic Lovenox tomorrow if INR is subtherapeutic. Will need to f/u plans for anticoagulation in AM.   8/31 INR is supratherapeutic at 3.1. Will hold warfarin. Patient is also NPO. Per MD if INR < 2 on recheck, will start them on heparin drip.   Lendon Ka, PharmD Pharmacy Resident 05/29/2017,10:34 AM

## 2017-05-29 NOTE — Consult Note (Signed)
Reason for Consult: Atrial fibrillation preop for surgery Referring Physician: With Dr. Ginette Pitman  Primary. Dr Margaretmary Eddy hospitalist Cardiologist Dr. Benay Spice Crystal Landry is an 83 y.o. female.  HPI: Patient history of atrial fibrillation rapid ventricular response endometrial cancer depression hypertension presented with functional bowel obstruction epigastric pain and nausea vomiting. Patient reports some diarrhea patient was in some mild left-sided hydronephrosis hydroureter. Patient was seen and evaluated by surgery Dr. Burt Knack recommended bowel rest and NG suction. No evidence of bleeding no chest pain feels poorly but still has NG tube in place which is uncomfortable. Patient complains of occasional palpitations and tachycardia none now.  Past Medical History:  Diagnosis Date  . A-fib ([place])   . Anxiety   . Atrial fibrillation ([place])   . Cancer (HCC)    endometrial  . Depression   . Hypertension     Past Surgical History:  Procedure Laterality Date  . ABDOMINAL HYSTERECTOMY    . MITRAL VALVE REPLACEMENT      Family History  Problem Relation Age of Onset  . Stroke Father   . Brain cancer Brother   . Colon cancer Brother   . Breast cancer Neg Hx     Social History:  reports that she has never smoked. She has never used smokeless tobacco. She reports that she does not drink alcohol or use drugs.  Allergies:  Allergies  Allergen Reactions  . Codeine Sulfate     Other reaction(s): Unknown  . Diphenhydramine Other (See Comments)    skin became red all over  . Ivp Dye [Iodinated Diagnostic Agents] Other (See Comments)    Skin becomes red all over  . Quinidine     Other reaction(s): Unknown Other reaction(s): Unknown    Medications: I have reviewed the patient's current medications.  Results for orders placed or performed during the hospital encounter of 05/28/17 (from the past 48 hour(s))  Lipase, blood     Status: None   Collection Time: 05/28/17 12:12 PM  Result Value Ref  Range   Lipase 36 11 - 51 U/L  Comprehensive metabolic panel     Status: Abnormal   Collection Time: 05/28/17 12:12 PM  Result Value Ref Range   Sodium 138 135 - 145 mmol/L   Potassium 3.8 3.5 - 5.1 mmol/L   Chloride 97 (L) 101 - 111 mmol/L   CO2 29 22 - 32 mmol/L   Glucose, Bld 112 (H) 65 - 99 mg/dL   BUN 22 (H) 6 - 20 mg/dL   Creatinine, Ser 1.48 (H) 0.44 - 1.00 mg/dL   Calcium 9.7 8.9 - 10.3 mg/dL   Total Protein 7.6 6.5 - 8.1 g/dL   Albumin 4.5 3.5 - 5.0 g/dL   AST 33 15 - 41 U/L   ALT 17 14 - 54 U/L   Alkaline Phosphatase 75 38 - 126 U/L   Total Bilirubin 1.1 0.3 - 1.2 mg/dL   GFR calc non Af Amer 31 (L) >60 mL/min   GFR calc Af Amer 35 (L) >60 mL/min    Comment: (NOTE) The eGFR has been calculated using the CKD EPI equation. This calculation has not been validated in all clinical situations. eGFR's persistently <60 mL/min signify possible Chronic Kidney Disease.    Anion gap 12 5 - 15  CBC     Status: Abnormal   Collection Time: 05/28/17 12:12 PM  Result Value Ref Range   WBC 8.6 3.6 - 11.0 K/uL   RBC 4.08 3.80 - 5.20 MIL/uL  Hemoglobin 13.7 12.0 - 16.0 g/dL   HCT 39.8 35.0 - 47.0 %   MCV 97.6 80.0 - 100.0 fL   MCH 33.5 26.0 - 34.0 pg   MCHC 34.4 32.0 - 36.0 g/dL   RDW 14.8 (H) 11.5 - 14.5 %   Platelets 221 150 - 440 K/uL  Urinalysis, Complete w Microscopic     Status: Abnormal   Collection Time: 05/28/17  3:48 PM  Result Value Ref Range   Color, Urine YELLOW (A) YELLOW   APPearance HAZY (A) CLEAR   Specific Gravity, Urine 1.019 1.005 - 1.030   pH 7.0 5.0 - 8.0   Glucose, UA NEGATIVE NEGATIVE mg/dL   Hgb urine dipstick MODERATE (A) NEGATIVE   Bilirubin Urine NEGATIVE NEGATIVE   Ketones, ur NEGATIVE NEGATIVE mg/dL   Protein, ur 30 (A) NEGATIVE mg/dL   Nitrite NEGATIVE NEGATIVE   Leukocytes, UA TRACE (A) NEGATIVE   RBC / HPF 6-30 0 - 5 RBC/hpf   WBC, UA 6-30 0 - 5 WBC/hpf   Bacteria, UA NONE SEEN NONE SEEN   Squamous Epithelial / LPF 0-5 (A) NONE SEEN    Mucus PRESENT   Protime-INR     Status: Abnormal   Collection Time: 05/28/17  3:48 PM  Result Value Ref Range   Prothrombin Time 27.8 (H) 11.4 - 15.2 seconds   INR 2.62   TSH     Status: None   Collection Time: 05/29/17  4:16 AM  Result Value Ref Range   TSH 1.164 0.350 - 4.500 uIU/mL    Comment: Performed by a 3rd Generation assay with a functional sensitivity of <=0.01 uIU/mL.  Protime-INR     Status: Abnormal   Collection Time: 05/29/17  4:16 AM  Result Value Ref Range   Prothrombin Time 31.7 (H) 11.4 - 15.2 seconds   INR 3.10   Comprehensive metabolic panel     Status: Abnormal   Collection Time: 05/29/17  4:16 AM  Result Value Ref Range   Sodium 139 135 - 145 mmol/L   Potassium 3.6 3.5 - 5.1 mmol/L   Chloride 102 101 - 111 mmol/L   CO2 27 22 - 32 mmol/L   Glucose, Bld 94 65 - 99 mg/dL   BUN 20 6 - 20 mg/dL   Creatinine, Ser 1.39 (H) 0.44 - 1.00 mg/dL   Calcium 8.6 (L) 8.9 - 10.3 mg/dL   Total Protein 6.2 (L) 6.5 - 8.1 g/dL   Albumin 3.7 3.5 - 5.0 g/dL   AST 22 15 - 41 U/L   ALT 14 14 - 54 U/L   Alkaline Phosphatase 61 38 - 126 U/L   Total Bilirubin 1.1 0.3 - 1.2 mg/dL   GFR calc non Af Amer 33 (L) >60 mL/min   GFR calc Af Amer 38 (L) >60 mL/min    Comment: (NOTE) The eGFR has been calculated using the CKD EPI equation. This calculation has not been validated in all clinical situations. eGFR's persistently <60 mL/min signify possible Chronic Kidney Disease.    Anion gap 10 5 - 15  CBC     Status: Abnormal   Collection Time: 05/29/17  4:16 AM  Result Value Ref Range   WBC 9.3 3.6 - 11.0 K/uL   RBC 3.87 3.80 - 5.20 MIL/uL   Hemoglobin 12.6 12.0 - 16.0 g/dL   HCT 37.8 35.0 - 47.0 %   MCV 97.7 80.0 - 100.0 fL   MCH 32.5 26.0 - 34.0 pg   MCHC 33.3 32.0 - 36.0  g/dL   RDW 14.9 (H) 11.5 - 14.5 %   Platelets 188 150 - 440 K/uL    Ct Abdomen Pelvis Wo Contrast  Result Date: 05/28/2017 CLINICAL DATA:  Abdominal pain, diarrhea and cramping for 2 months. EXAM: CT  ABDOMEN AND PELVIS WITHOUT CONTRAST TECHNIQUE: Multidetector CT imaging of the abdomen and pelvis was performed following the standard protocol without IV contrast. COMPARISON:  CT scan 09/29/2014 FINDINGS: Lower chest: The lung bases are clear of acute process. Minimal basilar scarring changes. The heart is enlarged and there are it dense coronary artery calcifications and dense aortic valve and mitral valve annular calcifications. Hepatobiliary: No focal hepatic lesions or intrahepatic biliary dilatation. The gallbladder is mildly distended and small layering gallstones are noted. No pericholecystic inflammatory changes or gallbladder wall thickening to suggest acute cholecystitis. Normal caliber common bile duct for age. Pancreas: Moderate pancreatic atrophy but no mass, inflammation or ductal dilatation. Spleen: Normal size.  No focal lesions. Adrenals/Urinary Tract: The adrenal glands are unremarkable and stable. Mild left-sided hydronephrosis and proximal left hydroureter but no obstructing ureteral calculi are identified. Possible recent stone passage and/or mild stricture. New line the right kidney is unremarkable. No renal or right ureteral calculi. Asymmetric bladder wall thickening appears relatively stable and could be due to chronic cystitis. I do not see a discrete mass. There is a right-sided diverticulum noted. Stomach/Bowel: Markedly distended stomach with fluid and air. The duodenum is also distended. Apparent wall thickening involving the third portion the duodenum could be an inflammatory process. I do not see a discrete mass but an infiltrating lesion is possible. Endoscopy may be necessary for further evaluation. No findings to suggest a mid distal small bowel obstruction. The terminal ileum appears normal. The appendix is normal except for a few small appendicoliths. Vascular/Lymphatic: Advanced vascular calcifications but no focal aneurysm. No mesenteric or retroperitoneal mass or  lymphadenopathy. Small scattered lymph nodes appears stable. Reproductive: Surgically absent. Other: No pelvic mass or adenopathy. No free pelvic fluid collections. No inguinal mass or adenopathy. No abdominal wall hernia or subcutaneous lesions. Musculoskeletal: Advanced degenerative changes involving the spine but no acute bony findings or worrisome bone lesions. Diffuse osteoporosis. IMPRESSION: 1. Markedly distended stomach and duodenum with apparent wall thickening involving the third portion the duodenum. Could not exclude a functional obstruction due to inflammation. No discrete mass but infiltrating tumor is possible. Endoscopy may be helpful for further evaluation. 2. No findings for small bowel obstruction or free air. 3. Cholelithiasis without definite findings for acute cholecystitis. 4. Advanced atherosclerotic calcifications involving the aorta and branch vessels. 5. Mild left-sided hydronephrosis and proximal left hydroureter without obvious cause. 6. Moderate bladder wall thickening with some asymmetry but no discrete mass. This appears relatively stable and could be due to chronic cystitis. Small right-sided bladder diverticulum noted. Electronically Signed   By: Marijo Sanes M.D.   On: 05/28/2017 13:49   Dg Chest 1 View  Result Date: 05/28/2017 CLINICAL DATA:  NG tube placement EXAM: CHEST 1 VIEW COMPARISON:  11/28/2015 FINDINGS: Post sternotomy changes. Esophageal tube extends below the diaphragm but the tip is non included. Elevation of left diaphragm. Mild cardiomegaly. Aortic atherosclerosis. Negative for pneumothorax or pleural effusion IMPRESSION: Esophageal tube tip extends below diaphragm but tip is non included. Cardiomegaly. Electronically Signed   By: Donavan Foil M.D.   On: 05/28/2017 18:18   Dg Abd 1 View  Result Date: 05/28/2017 CLINICAL DATA:  Evaluate NG tube EXAM: ABDOMEN - 1 VIEW COMPARISON:  None. FINDINGS:  The side port of the NG tube is just below the GE junction with  the distal tip in the body of the stomach. IMPRESSION: The side port of the NG tube is just below the GE junction with the distal tip in the body of the stomach. Electronically Signed   By: Dorise Bullion III M.D   On: 05/28/2017 19:15    Review of Systems  Constitutional: Positive for malaise/fatigue.  Eyes: Negative.   Respiratory: Positive for shortness of breath.   Cardiovascular: Positive for palpitations.  Gastrointestinal: Positive for abdominal pain, heartburn, nausea and vomiting.  Genitourinary: Negative.   Musculoskeletal: Positive for myalgias.  Skin: Negative.   Neurological: Positive for weakness.  Endo/Heme/Allergies: Negative.   Psychiatric/Behavioral: Negative.    Blood pressure (!) 133/55, pulse 63, temperature 97.9 F (36.6 C), temperature source Oral, resp. rate 18, height 5' 2.5" (1.588 m), weight 60.3 kg (133 lb), SpO2 93 %. Physical Exam  Nursing note and vitals reviewed. Constitutional: She is oriented to person, place, and time. She appears well-developed and well-nourished.  HENT:  Head: Normocephalic and atraumatic.  Right Ear: External ear normal.  Eyes: Pupils are equal, round, and reactive to light. Conjunctivae and EOM are normal.  Neck: Normal range of motion. Neck supple.  Cardiovascular: Normal rate and normal pulses.  An irregularly irregular rhythm present.  Murmur heard.  Systolic murmur is present with a grade of 2/6  Respiratory: Effort normal and breath sounds normal.  GI: Soft. She exhibits distension. There is tenderness.  NG tube in place with low suction  Musculoskeletal: Normal range of motion.  Neurological: She is alert and oriented to person, place, and time. She has normal reflexes.  Skin: Skin is warm and dry.  Psychiatric: She has a normal mood and affect.    Assessment/Plan: 1 Atrial fibrillation chronic 2 abdominal pain 3 hypertension 4 Depression 5 anxiety 6 hypercoagulable state secondary to Coumadin 7  hyperlipidemia 8 chronic renal insufficiency 9 functional bowel obstruction . Plan Agree with NG suction and bowel rest Agree with GI consult and evaluation Continue surgical input Recommend continued rate control for atrial fibrillation And diltiazem for rate Recommend follow-up with nephrology for renal insufficiency Continue hypertension control with Lotensin Cardizem Continue Coumadin therapy for anticoagulation Maintained Zocor therapy for hyperlipidemia Maintain conservative input at this point no intervention necessary   Sonna Lipsky D Zakariyah Freimark 05/29/2017, 1:33 PM

## 2017-05-30 ENCOUNTER — Encounter: Payer: Self-pay | Admitting: Anesthesiology

## 2017-05-30 LAB — BASIC METABOLIC PANEL
ANION GAP: 7 (ref 5–15)
BUN: 22 mg/dL — AB (ref 6–20)
CHLORIDE: 111 mmol/L (ref 101–111)
CO2: 26 mmol/L (ref 22–32)
Calcium: 8.2 mg/dL — ABNORMAL LOW (ref 8.9–10.3)
Creatinine, Ser: 0.93 mg/dL (ref 0.44–1.00)
GFR calc Af Amer: >60 mL/min (ref >60)
GFR, EST NON AFRICAN AMERICAN: 54 mL/min — AB (ref >60)
Glucose, Bld: 67 mg/dL (ref 65–99)
POTASSIUM: 4.4 mmol/L (ref 3.5–5.1)
SODIUM: 144 mmol/L (ref 135–145)

## 2017-05-30 LAB — URINE CULTURE: Culture: NO GROWTH

## 2017-05-30 LAB — MAGNESIUM: MAGNESIUM: 2.1 mg/dL (ref 1.7–2.4)

## 2017-05-30 LAB — PROTIME-INR
INR: 3.96
INR: 1.51
PROTHROMBIN TIME: 18.1 s — AB (ref 11.4–15.2)
Prothrombin Time: 38.4 seconds — ABNORMAL HIGH (ref 11.4–15.2)

## 2017-05-30 MED ORDER — HEPARIN BOLUS VIA INFUSION
3600.0000 [IU] | Freq: Once | INTRAVENOUS | Status: AC
Start: 2017-05-30 — End: 2017-05-30
  Administered 2017-05-30: 3600 [IU] via INTRAVENOUS
  Filled 2017-05-30: qty 3600

## 2017-05-30 MED ORDER — HEPARIN (PORCINE) IN NACL 100-0.45 UNIT/ML-% IJ SOLN
850.0000 [IU]/h | INTRAMUSCULAR | Status: DC
  Administered 2017-05-30: 850 [IU]/h via INTRAVENOUS
  Filled 2017-05-30: qty 250

## 2017-05-30 MED ORDER — VITAMIN K1 10 MG/ML IJ SOLN
2.5000 mg | Freq: Once | INTRAVENOUS | Status: AC
  Administered 2017-05-30: 2.5 mg via INTRAVENOUS
  Filled 2017-05-30: qty 0.25

## 2017-05-30 NOTE — Progress Notes (Signed)
[facility] for Warfarin Indication: atrial fibrillation  Allergies  Allergen Reactions  . Codeine Sulfate     Other reaction(s): Unknown  . Diphenhydramine Other (See Comments)    skin became red all over  . Ivp Dye [Iodinated Diagnostic Agents] Other (See Comments)    Skin becomes red all over  . Quinidine     Other reaction(s): Unknown Other reaction(s): Unknown    Patient Measurements: Height: 5' 2.5" (158.8 cm) Weight: 133 lb (60.3 kg) IBW/kg (Calculated) : 51.25  Vital Signs: Temp: 97.9 F (36.6 C) (09/01 0408) Temp Source: Oral (09/01 0408) BP: 123/62 (09/01 0408) Pulse Rate: 90 (09/01 0408)  Labs:  Recent Labs  05/28/17 1212 05/28/17 1548 05/29/17 0416 05/30/17 0518  HGB 13.7  --  12.6  --   HCT 39.8  --  37.8  --   PLT 221  --  188  --   LABPROT  --  27.8* 31.7* 38.4*  INR  --  2.62 3.10 3.96  CREATININE 1.48*  --  1.39* 0.93    Estimated Creatinine Clearance: 34.5 mL/min (by C-G formula based on SCr of 0.93 mg/dL).   Medical History: Past Medical History:  Diagnosis Date  . A-fib ([place])   . Anxiety   . Atrial fibrillation ([place])   . Cancer (HCC)    endometrial  . Depression   . Hypertension    Assessment: 83 y/o F with a known h/o atrial fibrillation on warfarin 4 mg daily PTA admitted with abdominal pain as well as intractable N/V. Last reported dose of warfarin 4mg  was 8/29.   Date INR 8/30 2.62 - held warfarin 8/31 3.1 - hold warfarin 9/1 3.96 - hold warfarin  Goal of Therapy:  INR 2-3   Plan:  8/30 INR is therapeutic. Patient is unable to tolerate oral medications at this time. Dr. Margaretmary Eddy tentatively plans for patient to begin therapeutic Lovenox tomorrow if INR is subtherapeutic. Will need to f/u plans for anticoagulation in AM.   8/31 INR is supratherapeutic at 3.1. Will hold warfarin. Patient is also NPO. Per MD if INR < 2 on recheck, will start them on heparin drip.   9/1 INR remains  supratherapeutic at 3.96. Will hold warfarin. Patient is also NPO.  Pharmacy will continue to follow.   Rayna Sexton, PharmD, BCPS Clinical Pharmacist 05/30/2017 8:00 AM

## 2017-05-30 NOTE — Progress Notes (Signed)
Gastroenterology Progress Note    Crystal Landry 83 y.o. 09-30-1926   Subjective: Denies abdominal pain. Sore throat with NG in place. 250 cc removed from NG canister earlier this morning (overnight amount). 700 cc removed yesterday. Son at bedside. Nurse in room.  Objective: Vital signs in last 24 hours: Vitals:   05/30/17 0408 05/30/17 0845  BP: 123/62 131/73  Pulse: 90 83  Resp: 18 14  Temp: 97.9 F (36.6 C) 98.4 F (36.9 C)  SpO2: 94% 96%    Physical Exam: Gen: lethargic, elderly, thin, no acute distress HEENT: anicteric sclera CV: RRR Chest: CTA B Abd: soft, nontender, nondistended, +BS Ext: no edema  Lab Results:  Recent Labs  05/29/17 0416 05/30/17 0518  NA 139 144  K 3.6 4.4  CL 102 111  CO2 27 26  GLUCOSE 94 67  BUN 20 22*  CREATININE 1.39* 0.93  CALCIUM 8.6* 8.2*  MG  --  2.1    Recent Labs  05/28/17 1212 05/29/17 0416  AST 33 22  ALT 17 14  ALKPHOS 75 61  BILITOT 1.1 1.1  PROT 7.6 6.2*  ALBUMIN 4.5 3.7    Recent Labs  05/28/17 1212 05/29/17 0416  WBC 8.6 9.3  HGB 13.7 12.6  HCT 39.8 37.8  MCV 97.6 97.7  PLT 221 188    Recent Labs  05/29/17 0416 05/30/17 0518  LABPROT 31.7* 38.4*  INR 3.10 3.96      Assessment/Plan: Duodenal obstruction of unclear etiology with NG tube in place. INR 3.96 so EGD postponed. Need INR 2.8 or less in case biopsies need to be done and discussed with Dr. Leslye Peer who will give IV Vitamin K. If INR still elevated tomorrow, then will need FFP. Tentatively, planning for EGD tomorrow morning. Keep NG tube in place. NPO.    Crystal C. 05/30/2017, 11:05 AMPatient ID: Crystal Landry, female   DOB: [birth year], 83 y.o.   MRN: [id number]

## 2017-05-30 NOTE — Progress Notes (Addendum)
[facility] for Warfarin Indication: atrial fibrillation  Allergies  Allergen Reactions  . Codeine Sulfate     Other reaction(s): Unknown  . Diphenhydramine Other (See Comments)    skin became red all over  . Ivp Dye [Iodinated Diagnostic Agents] Other (See Comments)    Skin becomes red all over  . Quinidine     Other reaction(s): Unknown Other reaction(s): Unknown    Patient Measurements: Height: 5' 2.5" (158.8 cm) Weight: 133 lb (60.3 kg) IBW/kg (Calculated) : 51.25  Heparin DW: 60 kg  Vital Signs: Temp: 98.3 F (36.8 C) (09/01 1950) Temp Source: Oral (09/01 1950) BP: 144/68 (09/01 1950) Pulse Rate: 73 (09/01 1950)  Labs:  Recent Labs  05/28/17 1212  05/29/17 0416 05/30/17 0518 05/30/17 2155  HGB 13.7  --  12.6  --   --   HCT 39.8  --  37.8  --   --   PLT 221  --  188  --   --   LABPROT  --   < > 31.7* 38.4* 18.1*  INR  --   < > 3.10 3.96 1.51  CREATININE 1.48*  --  1.39* 0.93  --   < > = values in this interval not displayed.  Estimated Creatinine Clearance: 34.5 mL/min (by C-G formula based on SCr of 0.93 mg/dL).   Medical History: Past Medical History:  Diagnosis Date  . A-fib ([place])   . Anxiety   . Atrial fibrillation ([place])   . Cancer (HCC)    endometrial  . Depression   . Hypertension    Assessment: 83 y/o F with a known h/o atrial fibrillation on warfarin 4 mg daily PTA admitted with abdominal pain as well as intractable N/V. Last reported dose of warfarin 4mg  was 8/29.   Date INR 8/30 2.62 - held warfarin 8/31 3.1 - hold warfarin 9/1 3.96 - hold warfarin  Goal of Therapy:  INR 2-3   Plan:  8/30 INR is therapeutic. Patient is unable to tolerate oral medications at this time. Dr. Margaretmary Eddy tentatively plans for patient to begin therapeutic Lovenox tomorrow if INR is subtherapeutic. Will need to f/u plans for anticoagulation in AM.   8/31 INR is supratherapeutic at 3.1. Will hold warfarin. Patient is also  NPO. Per MD if INR < 2 on recheck, will start them on heparin drip.   9/1 INR remains supratherapeutic at 3.96. Will hold warfarin. Patient is also NPO.  9/1 2200 INR 1.51. Heparin drip started at 850 units/hr with 3600 unit bolus. First heparin level 8 hours after start of infusion. Goal anti-Xa 0.3-0.7.  Pharmacy will continue to follow.   Sim Boast, PharmD, BCPS  05/30/17 10:59 PM

## 2017-05-30 NOTE — Progress Notes (Signed)
Patient ID: Crystal Landry, female   DOB: [birth year], 83 y.o.   MRN: [id number]  [facility] PROGRESS NOTE  Crystal Landry:[id number] DOB: 10-19-1926 DOA: 05/28/2017 PCP: [name], MD  HPI/Subjective: Patient still very uncomfortable with the NG tube. Procedure unable to be done today As INR is higher than yesterday at 3.96. Family at the bedside.  Objective: Vitals:   05/30/17 0845 05/30/17 1229  BP: 131/73 132/60  Pulse: 83 85  Resp: 14 18  Temp: 98.4 F (36.9 C) 98.2 F (36.8 C)  SpO2: 96% 96%    Filed Weights   05/28/17 1211  Weight: 60.3 kg (133 lb)    ROS: Review of Systems  Constitutional: Negative for chills and fever.  Eyes: Negative for blurred vision.  Respiratory: Negative for cough and shortness of breath.   Cardiovascular: Negative for chest pain.  Gastrointestinal: Negative for abdominal pain, constipation, diarrhea, nausea and vomiting.  Genitourinary: Negative for dysuria.  Musculoskeletal: Negative for joint pain.  Neurological: Negative for dizziness and headaches.   Exam: Physical Exam  Constitutional: She is oriented to person, place, and time.  HENT:  Nose: No mucosal edema.  Mouth/Throat: No oropharyngeal exudate or posterior oropharyngeal edema.  Eyes: Pupils are equal, round, and reactive to light. Conjunctivae, EOM and lids are normal.  Neck: No JVD present. Carotid bruit is not present. No edema present. No thyroid mass and no thyromegaly present.  Cardiovascular: S1 normal and S2 normal.  Exam reveals no gallop.   Murmur heard.  Systolic murmur is present with a grade of 4/6  Pulses:      Dorsalis pedis pulses are 2+ on the right side, and 2+ on the left side.  Metallic heart valve  Respiratory: No respiratory distress. She has no wheezes. She has no rhonchi. She has no rales.  GI: Soft. Bowel sounds are normal. There is no tenderness.  Musculoskeletal:       Right shoulder: She exhibits no swelling.   Lymphadenopathy:    She has no cervical adenopathy.  Neurological: She is alert and oriented to person, place, and time. No cranial nerve deficit.  Skin: Skin is warm. No rash noted. Nails show no clubbing.  Psychiatric: She has a normal mood and affect.      Data Reviewed: Basic Metabolic Panel:  Recent Labs Lab 05/28/17 1212 05/29/17 0416 05/30/17 0518  NA 138 139 144  K 3.8 3.6 4.4  CL 97* 102 111  CO2 29 27 26   GLUCOSE 112* 94 67  BUN 22* 20 22*  CREATININE 1.48* 1.39* 0.93  CALCIUM 9.7 8.6* 8.2*  MG  --   --  2.1   Liver Function Tests:  Recent Labs Lab 05/28/17 1212 05/29/17 0416  AST 33 22  ALT 17 14  ALKPHOS 75 61  BILITOT 1.1 1.1  PROT 7.6 6.2*  ALBUMIN 4.5 3.7    Recent Labs Lab 05/28/17 1212  LIPASE 36   CBC:  Recent Labs Lab 05/28/17 1212 05/29/17 0416  WBC 8.6 9.3  HGB 13.7 12.6  HCT 39.8 37.8  MCV 97.6 97.7  PLT 221 188      Studies: Dg Chest 1 View  Result Date: 05/28/2017 CLINICAL DATA:  NG tube placement EXAM: CHEST 1 VIEW COMPARISON:  11/28/2015 FINDINGS: Post sternotomy changes. Esophageal tube extends below the diaphragm but the tip is non included. Elevation of left diaphragm. Mild cardiomegaly. Aortic atherosclerosis. Negative for pneumothorax or pleural effusion IMPRESSION: Esophageal tube tip extends below diaphragm but tip  is non included. Cardiomegaly. Electronically Signed   By: Donavan Foil M.D.   On: 05/28/2017 18:18   Dg Abd 1 View  Result Date: 05/28/2017 CLINICAL DATA:  Evaluate NG tube EXAM: ABDOMEN - 1 VIEW COMPARISON:  None. FINDINGS: The side port of the NG tube is just below the GE junction with the distal tip in the body of the stomach. IMPRESSION: The side port of the NG tube is just below the GE junction with the distal tip in the body of the stomach. Electronically Signed   By: Dorise Bullion III M.D   On: 05/28/2017 19:15    Scheduled Meds: . pantoprazole (PROTONIX) IV  40 mg Intravenous Q12H    Continuous Infusions: . 0.9 % NaCl with KCl 20 mEq / L 50 mL/hr at 05/30/17 0644  . diltiazem (CARDIZEM) infusion 5 mg/hr (05/30/17 0250)    Assessment/Plan:  1. Coagulopathy. INR rising to 3.96. Give 2.5 mg of IV vitamin K today. Recheck an INR tonight. Start heparin drip tonight. Case discussed with gastroenterology and he will do procedure tomorrow if INR is down to less than 2.8.  I can transfuse FFP if INR is not down far enough. 2. Atrial fibrillation with rapid ventricular response. Better on Cardizem drip. 3. Duodenal obstruction. Case discussed with gastroenterology. IV Protonix. NG tube. Viscous lidocaine for NG tube pain. Hopefully endoscopy can be done tomorrow 4. Acute kidney injury. Improved with IV fluid hydration 5. Left-sided hydronephrosis and hydroureter. Urology on call on admission recommended outpatient follow-up 6. Hyperlipidemia unspecified. With NG tube and limited with medications 7. Metallic heart valve. Coumadin on hold. Will need heparin drip once INR comes down with vitamin K. Likely start this evening.  Code Status:     Code Status Orders        Start     Ordered   05/28/17 1732  Full code  Continuous     05/28/17 1732    Code Status History    Date Active Date Inactive Code Status Order ID Comments User Context   This patient has a current code status but no historical code status.    Advance Directive Documentation     Most Recent Value  Type of Advance Directive  Healthcare Power of Attorney  Pre-existing out of facility DNR order (yellow form or pink MOST form)  -  "MOST" Form in Place?  -     Family Communication: Sons at the bedside Disposition Plan: Must be able to tolerate solid food prior to disposition  Consultants:  Gastroenterology  Surgery  Cardiology  Time spent: 35 minutes  [place], [place]

## 2017-05-30 NOTE — Anesthesia Preprocedure Evaluation (Addendum)
Anesthesia Evaluation  Patient identified by MRN, date of birth, ID band Patient awake    Reviewed: Allergy & Precautions, NPO status , Patient's Chart, lab work & pertinent test results  Airway Mallampati: II  TM Distance: >3 FB     Dental   Pulmonary neg pulmonary ROS,    Pulmonary exam normal        Cardiovascular hypertension, Pt. on medications Normal cardiovascular exam+ Valvular Problems/Murmurs      Neuro/Psych PSYCHIATRIC DISORDERS Anxiety Depression negative neurological ROS     GI/Hepatic Neg liver ROS, GI bleed   Endo/Other  negative endocrine ROS  Renal/GU negative Renal ROS  negative genitourinary   Musculoskeletal negative musculoskeletal ROS (+)   Abdominal Normal abdominal exam  (+)   Peds negative pediatric ROS (+)  Hematology   Anesthesia Other Findings   Reproductive/Obstetrics                             Anesthesia Physical Anesthesia Plan  ASA: III and emergent  Anesthesia Plan: General   Post-op Pain Management:    Induction: Intravenous, Rapid sequence and Cricoid pressure planned  PONV Risk Score and Plan:   Airway Management Planned: Oral ETT  Additional Equipment:   Intra-op Plan:   Post-operative Plan: Extubation in OR  Informed Consent: I have reviewed the patients History and Physical, chart, labs and discussed the procedure including the risks, benefits and alternatives for the proposed anesthesia with the patient or authorized representative who has indicated his/her understanding and acceptance.   Dental advisory given  Plan Discussed with: CRNA and Surgeon  Anesthesia Plan Comments:        Anesthesia Quick Evaluation

## 2017-05-31 ENCOUNTER — Inpatient Hospital Stay: Payer: Medicare Other

## 2017-05-31 ENCOUNTER — Encounter
Admission: EM | Disposition: A | Discharge status: Other Acute Inpatient Hospital | Payer: Self-pay | Source: Home / Self Care | Attending: Internal Medicine

## 2017-05-31 ENCOUNTER — Inpatient Hospital Stay: Payer: Medicare Other | Admitting: Anesthesiology

## 2017-05-31 HISTORY — PX: ESOPHAGOGASTRODUODENOSCOPY (EGD) WITH PROPOFOL: SHX5813

## 2017-05-31 LAB — HEPARIN LEVEL (UNFRACTIONATED)
HEPARIN UNFRACTIONATED: 1.21 [IU]/mL — AB (ref 0.30–0.70)
Heparin Unfractionated: 0.57 IU/mL (ref 0.30–0.70)

## 2017-05-31 LAB — BASIC METABOLIC PANEL
ANION GAP: 10 (ref 5–15)
BUN: 21 mg/dL — ABNORMAL HIGH (ref 6–20)
CHLORIDE: 111 mmol/L (ref 101–111)
CO2: 22 mmol/L (ref 22–32)
CREATININE: 0.87 mg/dL (ref 0.44–1.00)
Calcium: 8.2 mg/dL — ABNORMAL LOW (ref 8.9–10.3)
GFR calc non Af Amer: 58 mL/min — ABNORMAL LOW (ref >60)
Glucose, Bld: 64 mg/dL — ABNORMAL LOW (ref 65–99)
POTASSIUM: 4.5 mmol/L (ref 3.5–5.1)
SODIUM: 143 mmol/L (ref 135–145)

## 2017-05-31 LAB — CBC
HCT: 33.9 % — ABNORMAL LOW (ref 35.0–47.0)
HEMOGLOBIN: 11.4 g/dL — AB (ref 12.0–16.0)
MCH: 33 pg (ref 26.0–34.0)
MCHC: 33.7 g/dL (ref 32.0–36.0)
MCV: 98.2 fL (ref 80.0–100.0)
PLATELETS: 172 10*3/uL (ref 150–440)
RBC: 3.46 MIL/uL — AB (ref 3.80–5.20)
RDW: 14.9 % — ABNORMAL HIGH (ref 11.5–14.5)
WBC: 8.4 10*3/uL (ref 3.6–11.0)

## 2017-05-31 LAB — GLUCOSE, CAPILLARY
GLUCOSE-CAPILLARY: 82 mg/dL (ref 65–99)
Glucose-Capillary: 132 mg/dL — ABNORMAL HIGH (ref 65–99)

## 2017-05-31 LAB — MAGNESIUM: MAGNESIUM: 2 mg/dL (ref 1.7–2.4)

## 2017-05-31 LAB — ABO/RH: ABO/RH(D): O NEG

## 2017-05-31 LAB — PHOSPHORUS: PHOSPHORUS: 2.7 mg/dL (ref 2.5–4.6)

## 2017-05-31 LAB — PROTIME-INR
INR: 1.47
Prothrombin Time: 17.7 seconds — ABNORMAL HIGH (ref 11.4–15.2)

## 2017-05-31 SURGERY — ESOPHAGOGASTRODUODENOSCOPY (EGD) WITH PROPOFOL
Anesthesia: General

## 2017-05-31 MED ORDER — DEXAMETHASONE SODIUM PHOSPHATE 4 MG/ML IJ SOLN
INTRAMUSCULAR | Status: DC | PRN
  Administered 2017-05-31: 10 mg via INTRAVENOUS

## 2017-05-31 MED ORDER — SUCCINYLCHOLINE CHLORIDE 20 MG/ML IJ SOLN
INTRAMUSCULAR | Status: AC
  Filled 2017-05-31: qty 1

## 2017-05-31 MED ORDER — DILTIAZEM HCL ER COATED BEADS 180 MG PO CP24
300.0000 mg | ORAL_CAPSULE | Freq: Every day | ORAL | Status: DC
  Administered 2017-05-31 – 2017-06-02 (×3): 300 mg via ORAL
  Filled 2017-05-31 (×3): qty 1

## 2017-05-31 MED ORDER — METOPROLOL TARTRATE 5 MG/5ML IV SOLN
5.0000 mg | INTRAVENOUS | Status: DC | PRN
  Administered 2017-05-31: 3 mg via INTRAVENOUS

## 2017-05-31 MED ORDER — ONDANSETRON HCL 4 MG/2ML IJ SOLN
INTRAMUSCULAR | Status: AC
  Filled 2017-05-31: qty 2

## 2017-05-31 MED ORDER — LIDOCAINE HCL (CARDIAC) 20 MG/ML IV SOLN
INTRAVENOUS | Status: DC | PRN
  Administered 2017-05-31: 40 mg via INTRAVENOUS

## 2017-05-31 MED ORDER — PROPOFOL 500 MG/50ML IV EMUL
INTRAVENOUS | Status: AC
  Filled 2017-05-31: qty 50

## 2017-05-31 MED ORDER — SUCCINYLCHOLINE CHLORIDE 20 MG/ML IJ SOLN
INTRAMUSCULAR | Status: DC | PRN
  Administered 2017-05-31: 80 mg via INTRAVENOUS

## 2017-05-31 MED ORDER — WARFARIN SODIUM 6 MG PO TABS
6.0000 mg | ORAL_TABLET | Freq: Once | ORAL | Status: AC
  Administered 2017-05-31: 6 mg via ORAL
  Filled 2017-05-31 (×2): qty 1

## 2017-05-31 MED ORDER — SODIUM CHLORIDE 0.9 % IV SOLN
INTRAVENOUS | Status: DC
  Administered 2017-05-31: 11:00:00 via INTRAVENOUS

## 2017-05-31 MED ORDER — DIGOXIN 0.25 MG/ML IJ SOLN
0.5000 mg | Freq: Once | INTRAMUSCULAR | Status: AC
  Administered 2017-05-31: 0.5 mg via INTRAVENOUS
  Filled 2017-05-31: qty 2

## 2017-05-31 MED ORDER — HEPARIN (PORCINE) IN NACL 100-0.45 UNIT/ML-% IJ SOLN
550.0000 [IU]/h | INTRAMUSCULAR | Status: DC
  Administered 2017-05-31: 700 [IU]/h via INTRAVENOUS
  Administered 2017-06-02: 650 [IU]/h via INTRAVENOUS
  Filled 2017-05-31 (×2): qty 250

## 2017-05-31 MED ORDER — DEXAMETHASONE SODIUM PHOSPHATE 10 MG/ML IJ SOLN
INTRAMUSCULAR | Status: AC
  Filled 2017-05-31: qty 1

## 2017-05-31 MED ORDER — WARFARIN - PHARMACIST DOSING INPATIENT: Freq: Every day | Status: DC

## 2017-05-31 MED ORDER — SODIUM CHLORIDE 0.9 % IV SOLN: INTRAVENOUS | Status: DC

## 2017-05-31 MED ORDER — LIDOCAINE HCL (PF) 2 % IJ SOLN
INTRAMUSCULAR | Status: AC
  Filled 2017-05-31: qty 2

## 2017-05-31 MED ORDER — ONDANSETRON HCL 4 MG/2ML IJ SOLN
4.0000 mg | Freq: Once | INTRAMUSCULAR | Status: AC | PRN
  Administered 2017-05-31: 4 mg via INTRAVENOUS

## 2017-05-31 MED ORDER — FENTANYL CITRATE (PF) 100 MCG/2ML IJ SOLN: 25.0000 ug | INTRAMUSCULAR | Status: DC | PRN

## 2017-05-31 MED ORDER — PROPOFOL 10 MG/ML IV BOLUS
INTRAVENOUS | Status: DC | PRN
  Administered 2017-05-31: 70 mg via INTRAVENOUS

## 2017-05-31 MED ORDER — M.V.I. ADULT IV INJ
INJECTION | INTRAVENOUS | Status: AC
  Administered 2017-05-31: 21:00:00 via INTRAVENOUS
  Filled 2017-05-31 (×2): qty 960

## 2017-05-31 MED ORDER — INSULIN ASPART 100 UNIT/ML ~~LOC~~ SOLN
0.0000 [IU] | SUBCUTANEOUS | Status: DC
  Administered 2017-05-31: 1 [IU] via SUBCUTANEOUS
  Administered 2017-06-01: 2 [IU] via SUBCUTANEOUS
  Administered 2017-06-01: 1 [IU] via SUBCUTANEOUS
  Administered 2017-06-01 (×4): 2 [IU] via SUBCUTANEOUS
  Administered 2017-06-02 – 2017-06-04 (×6): 1 [IU] via SUBCUTANEOUS
  Filled 2017-05-31 (×14): qty 1

## 2017-05-31 MED ORDER — DILTIAZEM HCL 100 MG IV SOLR
5.0000 mg/h | INTRAVENOUS | Status: DC
  Filled 2017-05-31: qty 100

## 2017-05-31 NOTE — Progress Notes (Signed)
NG tube dc'd during upper endoscopy.

## 2017-05-31 NOTE — Progress Notes (Signed)
Spoke with RN regarding PICC placement on 06-01-17.  States MD wants it today due to TNA need.  Rn to refer to [facility].

## 2017-05-31 NOTE — Interval H&P Note (Signed)
History and Physical Interval Note:  05/31/2017 10:31 AM  Crystal Landry  has presented today for surgery, with the diagnosis of GI bleed  The various methods of treatment have been discussed with the patient and family. After consideration of risks, benefits and other options for treatment, the patient has consented to  Procedure(s): ESOPHAGOGASTRODUODENOSCOPY (EGD) WITH PROPOFOL (N/A) as a surgical intervention .  The patient's history has been reviewed, patient examined, no change in status, stable for surgery.  I have reviewed the patient's chart and labs.  Questions were answered to the patient's satisfaction.     [place] C.

## 2017-05-31 NOTE — Anesthesia Procedure Notes (Signed)
Procedure Name: Intubation Date/Time: 05/31/2017 10:48 AM Performed by: Nelda Marseille Pre-anesthesia Checklist: Patient identified, Patient being monitored, Timeout performed, Emergency Drugs available and Suction available Patient Re-evaluated:Patient Re-evaluated prior to induction Oxygen Delivery Method: Circle system utilized Preoxygenation: Pre-oxygenation with 100% oxygen Induction Type: IV induction Ventilation: Mask ventilation without difficulty Laryngoscope Size: Mac and 3 Grade View: Grade I Tube type: Oral Tube size: 7.0 mm Number of attempts: 1 Airway Equipment and Method: Stylet Placement Confirmation: ETT inserted through vocal cords under direct vision,  positive ETCO2 and breath sounds checked- equal and bilateral Secured at: 21 cm Tube secured with: Tape Dental Injury: Teeth and Oropharynx as per pre-operative assessment

## 2017-05-31 NOTE — Progress Notes (Signed)
Patient's son has been calling me and finding me on the floor about every 10 minutes to ask that I call Endoscopy to find out what time the procedure will be.  He says he's afraid his mother will be forgotten.  I assure him she won't be forgotten, but still insists I call endoscopy.  Transport finally arrived to take her down.

## 2017-05-31 NOTE — Progress Notes (Signed)
PT Cancellation Note  Patient Details Name: Crystal Landry MRN: [id number] DOB: 10/11/1926   Cancelled Treatment:    Reason Eval/Treat Not Completed: Fatigue/lethargy limiting ability to participate (patient underwent endoscopy this AM with propofol. PT attempted evaluation this PM once patient back in room. However patient too lethargic and fatigued. Will re-attempt PT eval once patient is medically ready and appropriate. ) Thank you for this referral.   Vendela Troung PT, DPT 05/31/2017, 2:21 PM

## 2017-05-31 NOTE — Plan of Care (Signed)
Problem: Nutrition: Goal: Adequate nutrition will be maintained Outcome: Progressing Patient will start on TPN this evening

## 2017-05-31 NOTE — Progress Notes (Signed)
[facility] for Warfarin/Heparin Indication: atrial fibrillation  Allergies  Allergen Reactions  . Codeine Sulfate     Other reaction(s): Unknown  . Diphenhydramine Other (See Comments)    skin became red all over  . Ivp Dye [Iodinated Diagnostic Agents] Other (See Comments)    Skin becomes red all over  . Quinidine     Other reaction(s): Unknown Other reaction(s): Unknown    Patient Measurements: Height: 5' 2.5" (158.8 cm) Weight: 133 lb (60.3 kg) IBW/kg (Calculated) : 51.25  Heparin DW: 60 kg  Vital Signs: Temp: 98.2 F (36.8 C) (09/02 0735) Temp Source: Oral (09/02 0735) BP: 137/68 (09/02 0735) Pulse Rate: 96 (09/02 0735)  Labs:  Recent Labs  05/28/17 1212  05/29/17 0416 05/30/17 0518 05/30/17 2155 05/31/17 0457 05/31/17 0757  HGB 13.7  --  12.6  --   --  11.4*  --   HCT 39.8  --  37.8  --   --  33.9*  --   PLT 221  --  188  --   --  172  --   LABPROT  --   < > 31.7* 38.4* 18.1* 17.7*  --   INR  --   < > 3.10 3.96 1.51 1.47  --   HEPARINUNFRC  --   --   --   --   --   --  1.21*  CREATININE 1.48*  --  1.39* 0.93  --  0.87  --   < > = values in this interval not displayed.  Estimated Creatinine Clearance: 36.9 mL/min (by C-G formula based on SCr of 0.87 mg/dL).   Medical History: Past Medical History:  Diagnosis Date  . A-fib ([place])   . Anxiety   . Atrial fibrillation ([place])   . Cancer ([facility])    endometrial  . Depression   . Hypertension    Assessment: 83 y/o F with a known h/o atrial fibrillation on warfarin 4 mg daily PTA admitted with abdominal pain as well as intractable N/V. Last reported dose of warfarin 4mg  was 8/29.   Date INR 8/30 2.62 - held warfarin 8/31 3.1 - hold warfarin 9/1 3.96 - hold warfarin --> INR 1.51 after vitamin K 2.5 mg IV, heparin drip started  Goal of Therapy:  INR 2-3   Plan:  8/30 INR is therapeutic. Patient is unable to tolerate oral medications at this time. Dr. Margaretmary Eddy  tentatively plans for patient to begin therapeutic Lovenox tomorrow if INR is subtherapeutic. Will need to f/u plans for anticoagulation in AM.   8/31 INR is supratherapeutic at 3.1. Will hold warfarin. Patient is also NPO. Per MD if INR < 2 on recheck, will start them on heparin drip.   9/1 INR remains supratherapeutic at 3.96. Will hold warfarin. Patient is also NPO.  9/1 2200 INR 1.51. Heparin drip started at 850 units/hr with 3600 unit bolus. First heparin level 8 hours after start of infusion. Goal anti-Xa 0.3-0.7.  9/2 0800 First heparin level at 1.21. Per RN who went into pt's room, lab was drawn from left arm and heparin was infusing in right arm. Heparin currently stopped at 0845 for EGD. Asked RN to call pharmacy when drip needs to be restarted for order. For level of 1.21 normally would need to hold heparin drip for one hour but heparin off for procedure currently. Will need to restart heparin at 700 units/hr (=7 ml/hr). Will need to order heparin level. CBC in AM.   Pharmacy will  continue to follow.   Rayna Sexton, PharmD, BCPS Clinical Pharmacist 05/31/2017 10:04 AM

## 2017-05-31 NOTE — Anesthesia Post-op Follow-up Note (Signed)
Anesthesia QCDR form completed.        

## 2017-05-31 NOTE — Progress Notes (Signed)
Patient ID: Crystal Landry, female   DOB: 03-08-1927, 83 y.o.   MRN: [id number]  [facility] PROGRESS NOTE  KAILAH PENNEL GYJ:[id number] DOB: [birth year] DOA: 05/28/2017 PCP: [name], MD  HPI/Subjective: Patient feeling okay, pain with NGT, no abdominal pain  Objective: Vitals:   05/31/17 0500 05/31/17 0735  BP: (!) 117/94 137/68  Pulse: 89 96  Resp: 18 18  Temp: 98.2 F (36.8 C) 98.2 F (36.8 C)  SpO2: 96% 95%    Filed Weights   05/28/17 1211  Weight: 60.3 kg (133 lb)    ROS: Review of Systems  Constitutional: Negative for chills and fever.  Eyes: Negative for blurred vision.  Respiratory: Negative for cough and shortness of breath.   Cardiovascular: Negative for chest pain.  Gastrointestinal: Negative for abdominal pain, constipation, diarrhea, nausea and vomiting.  Genitourinary: Negative for dysuria.  Musculoskeletal: Negative for joint pain.  Neurological: Negative for dizziness and headaches.   Exam: Physical Exam  Constitutional: She is oriented to person, place, and time.  HENT:  Nose: No mucosal edema.  Mouth/Throat: No oropharyngeal exudate or posterior oropharyngeal edema.  Eyes: Pupils are equal, round, and reactive to light. Conjunctivae, EOM and lids are normal.  Neck: No JVD present. Carotid bruit is not present. No edema present. No thyroid mass and no thyromegaly present.  Cardiovascular: S1 normal and S2 normal.  Exam reveals no gallop.   Murmur heard.  Systolic murmur is present with a grade of 4/6  Pulses:      Dorsalis pedis pulses are 2+ on the right side, and 2+ on the left side.  Metallic heart valve  Respiratory: No respiratory distress. She has no wheezes. She has no rhonchi. She has no rales.  GI: Soft. Bowel sounds are normal. There is no tenderness.  Musculoskeletal:       Right shoulder: She exhibits no swelling.  Lymphadenopathy:    She has no cervical adenopathy.  Neurological: She is alert and oriented to  person, place, and time. No cranial nerve deficit.  Skin: Skin is warm. No rash noted. Nails show no clubbing.  Psychiatric: She has a normal mood and affect.      Data Reviewed: Basic Metabolic Panel:  Recent Labs Lab 05/28/17 1212 05/29/17 0416 05/30/17 0518 05/31/17 0457  NA 138 139 144 143  K 3.8 3.6 4.4 4.5  CL 97* 102 111 111  CO2 29 27 26 22   GLUCOSE 112* 94 67 64*  BUN 22* 20 22* 21*  CREATININE 1.48* 1.39* 0.93 0.87  CALCIUM 9.7 8.6* 8.2* 8.2*  MG  --   --  2.1  --    Liver Function Tests:  Recent Labs Lab 05/28/17 1212 05/29/17 0416  AST 33 22  ALT 17 14  ALKPHOS 75 61  BILITOT 1.1 1.1  PROT 7.6 6.2*  ALBUMIN 4.5 3.7    Recent Labs Lab 05/28/17 1212  LIPASE 36   CBC:  Recent Labs Lab 05/28/17 1212 05/29/17 0416 05/31/17 0457  WBC 8.6 9.3 8.4  HGB 13.7 12.6 11.4*  HCT 39.8 37.8 33.9*  MCV 97.6 97.7 98.2  PLT 221 188 172      Studies: No results found.  Scheduled Meds: . digoxin  0.5 mg Intravenous Once  . pantoprazole (PROTONIX) IV  40 mg Intravenous Q12H   Continuous Infusions: . sodium chloride    . sodium chloride    . 0.9 % NaCl with KCl 20 mEq / L 50 mL/hr at 05/30/17 0644  .  diltiazem (CARDIZEM) infusion 5 mg/hr (05/30/17 0250)  . heparin 850 Units/hr (05/30/17 2354)    Assessment/Plan:  1. Preoperative evaluation for endoscopy. Case discussed with Dr. Arbie Cookey anesthesia. 2 stop heparin drip now. Also they would like to start Cardizem drip. IV digoxin to be given. If heart rate goes high during procedure and then when necessary metoprolol or Cardizem can be given during procedure. Patient will have general anesthesia because of risk of aspiration with the procedure. I had Dr. Arbie Cookey speak with the family about this. I was present in the room while Dr. Arbie Cookey was there. Patient needs to have this procedure done because if she doesn't eat, she won't survive. Higher risk of stroke while off heparin drip. 2. Atrial fibrillation with  rapid ventricular response. Better on Cardizem drip. 3. Duodenal obstruction. Case discussed with gastroenterology. IV Protonix. NG tube. Viscous lidocaine for NG tube pain. Endoscopy today for diagnoses and biopsies if needed 4. Acute kidney injury. Improved with IV fluid hydration 5. Left-sided hydronephrosis and hydroureter. Urology on call on admission recommended outpatient follow-up 6. Hyperlipidemia unspecified. With NG tube and limited with medications 7. Metallic heart valve. Coumadin on hold. Heparin drip will be on hold as of now and restarted as soon as possible after procedure.  Code Status:     Code Status Orders        Start     Ordered   05/28/17 1732  Full code  Continuous     05/28/17 1732    Code Status History    Date Active Date Inactive Code Status Order ID Comments User Context   This patient has a current code status but no historical code status.    Advance Directive Documentation     Most Recent Value  Type of Advance Directive  Healthcare Power of Attorney  Pre-existing out of facility DNR order (yellow form or pink MOST form)  -  "MOST" Form in Place?  -     Family Communication: Sons at the bedside Disposition Plan: Must be able to tolerate solid food prior to disposition  Consultants:  Gastroenterology  Surgery  Cardiology  Time spent: 45 minutes in coordination of care and face-to-face time. Case discussed with Dr. Arbie Cookey anesthesiologist before speaking with the family with anesthesia.  Loletha Grayer  [employer]

## 2017-05-31 NOTE — Transfer of Care (Signed)
Immediate Anesthesia Transfer of Care Note  Patient: Crystal Landry  Procedure(s) Performed: Procedure(s): ESOPHAGOGASTRODUODENOSCOPY (EGD) WITH PROPOFOL (N/A)  Patient Location: PACU  Anesthesia Type:General  Level of Consciousness: sedated  Airway & Oxygen Therapy: Patient Spontanous Breathing and Patient connected to face mask oxygen  Post-op Assessment: Report given to RN and Post -op Vital signs reviewed and stable  Post vital signs: Reviewed and stable  Last Vitals:  Vitals:   05/31/17 0500 05/31/17 0735  BP: (!) 117/94 137/68  Pulse: 89 96  Resp: 18 18  Temp: 36.8 C 36.8 C  SpO2: 96% 95%    Last Pain:  Vitals:   05/31/17 0735  TempSrc: Oral  PainSc:          Complications: No apparent anesthesia complications

## 2017-05-31 NOTE — Progress Notes (Signed)
[facility] for Warfarin/Heparin Indication: atrial fibrillation  Allergies  Allergen Reactions  . Codeine Sulfate     Other reaction(s): Unknown  . Diphenhydramine Other (See Comments)    skin became red all over  . Ivp Dye [Iodinated Diagnostic Agents] Other (See Comments)    Skin becomes red all over  . Quinidine     Other reaction(s): Unknown Other reaction(s): Unknown    Patient Measurements: Height: 5' 2.5" (158.8 cm) Weight: 130 lb 14.4 oz (59.4 kg) IBW/kg (Calculated) : 51.25  Heparin DW: 60 kg  Vital Signs: Temp: 98.3 F (36.8 C) (09/02 1228) Temp Source: Oral (09/02 1228) BP: 144/61 (09/02 1228) Pulse Rate: 85 (09/02 1228)  Labs:  Recent Labs  05/29/17 0416 05/30/17 0518 05/30/17 2155 05/31/17 0457 05/31/17 0757  HGB 12.6  --   --  11.4*  --   HCT 37.8  --   --  33.9*  --   PLT 188  --   --  172  --   LABPROT 31.7* 38.4* 18.1* 17.7*  --   INR 3.10 3.96 1.51 1.47  --   HEPARINUNFRC  --   --   --   --  1.21*  CREATININE 1.39* 0.93  --  0.87  --     Estimated Creatinine Clearance: 36.9 mL/min (by C-G formula based on SCr of 0.87 mg/dL).   Medical History: Past Medical History:  Diagnosis Date  . A-fib ([place])   . Anxiety   . Atrial fibrillation ([place])   . Cancer (HCC)    endometrial  . Depression   . Hypertension    Assessment: 83 y/o F with a known h/o atrial fibrillation on warfarin 4 mg daily PTA admitted with abdominal pain as well as intractable N/V. Last reported dose of warfarin 4mg  was 8/29.   Heparin resumed today at 1254 @ 700 units/hr. Check HL in 8 hours   Pharmacy will continue to follow.   Ramond Dial, Pharm.D, BCPS Clinical Pharmacist  05/31/2017 3:13 PM

## 2017-05-31 NOTE — Progress Notes (Signed)
[facility] for Warfarin Indication: atrial fibrillation  Allergies  Allergen Reactions  . Codeine Sulfate     Other reaction(s): Unknown  . Diphenhydramine Other (See Comments)    skin became red all over  . Ivp Dye [Iodinated Diagnostic Agents] Other (See Comments)    Skin becomes red all over  . Quinidine     Other reaction(s): Unknown Other reaction(s): Unknown    Patient Measurements: Height: 5' 2.5" (158.8 cm) Weight: 133 lb (60.3 kg) IBW/kg (Calculated) : 51.25  Heparin DW: 60 kg  Vital Signs: Temp: 98.3 F (36.8 C) (09/02 1228) Temp Source: Oral (09/02 1228) BP: 144/61 (09/02 1228) Pulse Rate: 85 (09/02 1228)  Labs:  Recent Labs  05/29/17 0416 05/30/17 0518 05/30/17 2155 05/31/17 0457 05/31/17 0757  HGB 12.6  --   --  11.4*  --   HCT 37.8  --   --  33.9*  --   PLT 188  --   --  172  --   LABPROT 31.7* 38.4* 18.1* 17.7*  --   INR 3.10 3.96 1.51 1.47  --   HEPARINUNFRC  --   --   --   --  1.21*  CREATININE 1.39* 0.93  --  0.87  --     Estimated Creatinine Clearance: 36.9 mL/min (by C-G formula based on SCr of 0.87 mg/dL).   Medical History: Past Medical History:  Diagnosis Date  . A-fib ([place])   . Anxiety   . Atrial fibrillation ([place])   . Cancer (HCC)    endometrial  . Depression   . Hypertension    Assessment: 83 y/o F with a known h/o atrial fibrillation and mechnical valve on warfarin 4 mg daily PTA admitted with abdominal pain as well as intractable N/V. Pt warfarin was held and was given vit K to reduce INR for endoscopy.  Date INR 8/30 2.62 - held warfarin 8/31 3.1 - hold warfarin 9/1 3.96 - hold warfarin, Vit K 2.5mg  IV given bc pt needed procedure 9/2       1.47  Goal of Therapy:  INR 2.5-3.5  Plan:  Will resume warfarin tonight if pt able. Will give 6mg  tonight. Follow INR. Continue heparin drip until INR therapeutic x2. Goal INR is 2.5-3.5. Follow INR closely due to diet changes-pt being  started on TPN this evening  Pharmacy will continue to follow.   Ramond Dial, Pharm.D, BCPS Clinical Pharmacist  05/31/17 1:06 PM

## 2017-05-31 NOTE — Progress Notes (Signed)
Patient's urine is tea colored with obvious pinkness, I an pretty confident is blood.  I confirmed with Dr. Leslye Peer that he does want the heparin drip restarted.  He wants to wait to start the Cardizem drip and see if she can take oral Cardizem.

## 2017-05-31 NOTE — Consult Note (Signed)
Pharmacist consulted for TPN- patient awaiting PICC line RD still to assess pt. Will give Clinimix 5/15 @ 71ml/hr until the RD can assess pt. Baseline labs ordered - no electrolytes abnormalities Labs for tomorrow AM entered Strict in/outs daily weights ordered CBG and SSI ordered Formal note to follow  Ramond Dial, Pharm.D, BCPS Clinical Pharmacist

## 2017-05-31 NOTE — H&P (View-Only) (Signed)
Gastroenterology Progress Note    Crystal Landry 83 y.o. June 22, 1927   Subjective: Denies abdominal pain. Sore throat with NG in place. 250 cc removed from NG canister earlier this morning (overnight amount). 700 cc removed yesterday. Son at bedside. Nurse in room.  Objective: Vital signs in last 24 hours: Vitals:   05/30/17 0408 05/30/17 0845  BP: 123/62 131/73  Pulse: 90 83  Resp: 18 14  Temp: 97.9 F (36.6 C) 98.4 F (36.9 C)  SpO2: 94% 96%    Physical Exam: Gen: lethargic, elderly, thin, no acute distress HEENT: anicteric sclera CV: RRR Chest: CTA B Abd: soft, nontender, nondistended, +BS Ext: no edema  Lab Results:  Recent Labs  05/29/17 0416 05/30/17 0518  NA 139 144  K 3.6 4.4  CL 102 111  CO2 27 26  GLUCOSE 94 67  BUN 20 22*  CREATININE 1.39* 0.93  CALCIUM 8.6* 8.2*  MG  --  2.1    Recent Labs  05/28/17 1212 05/29/17 0416  AST 33 22  ALT 17 14  ALKPHOS 75 61  BILITOT 1.1 1.1  PROT 7.6 6.2*  ALBUMIN 4.5 3.7    Recent Labs  05/28/17 1212 05/29/17 0416  WBC 8.6 9.3  HGB 13.7 12.6  HCT 39.8 37.8  MCV 97.6 97.7  PLT 221 188    Recent Labs  05/29/17 0416 05/30/17 0518  LABPROT 31.7* 38.4*  INR 3.10 3.96      Assessment/Plan: Duodenal obstruction of unclear etiology with NG tube in place. INR 3.96 so EGD postponed. Need INR 2.8 or less in case biopsies need to be done and discussed with Dr. Leslye Peer who will give IV Vitamin K. If INR still elevated tomorrow, then will need FFP. Tentatively, planning for EGD tomorrow morning. Keep NG tube in place. NPO.    [place] C. 05/30/2017, 11:05 AMPatient ID: Crystal Landry, female   DOB: Apr 27, 1927, 83 y.o.   MRN: [id number]

## 2017-05-31 NOTE — Progress Notes (Signed)
Endoscopy reports that the EGD can not be done while the patient is on the Cardizem drip.  Contacted Dr. Clayborn Bigness, he order IV digoxin and to stop Cardizem 20 minutes later.  Also ordered PRN metoprolol.  When I informed the son about the delay he got very angry.  He was calm but angry.  He wanted to know why this hadn't been dealt with yesterday so the procedure could take place.  I acknowledged his frustration.  He asked to see Dr. Leslye Peer.  I informed Dr. Leslye Peer of the delay.  He went down to Endoscopy to speak with the Anesthesiologist.   He returned and went over everything with the son.  The Anesthesiologist arrived and also explained his concerns to the son and everything he was doing to have a successful outcome.  The son seems better after these discussion.s

## 2017-05-31 NOTE — Op Note (Signed)
[facility] Patient Name: Crystal Landry Procedure Date: 05/31/2017 10:26 AM MRN: [id number] Account #: [id number] Date of Birth: 1927-06-19 Admit Type: Inpatient Age: 83 Room: [facility] ENDO ROOM 2 Gender: Female Note Status: Finalized Procedure:            Upper GI endoscopy Indications:          Abnormal CT of the GI tract, Nausea with vomiting Providers:            Lear Ng, MD Medicines:            Propofol per Anesthesia, General Anesthesia Complications:        No immediate complications. Procedure:            Pre-Anesthesia Assessment:                       - Prior to the procedure, a History and Physical was                        performed, and patient medications and allergies were                        reviewed. The patient's tolerance of previous                        anesthesia was also reviewed. The risks and benefits of                        the procedure and the sedation options and risks were                        discussed with the patient. All questions were                        answered, and informed consent was obtained. Prior                        Anticoagulants: The patient has taken no previous                        anticoagulant or antiplatelet agents. ASA Grade                        Assessment: III - A patient with severe systemic                        disease. After reviewing the risks and benefits, the                        patient was deemed in satisfactory condition to undergo                        the procedure.                       After obtaining informed consent, the endoscope was                        passed under direct vision. Throughout the procedure,  the patient's blood pressure, pulse, and oxygen                        saturations were monitored continuously. The                        Colonoscope was introduced through the mouth, and   advanced to the second part of duodenum. The upper GI                        endoscopy was performed with difficulty due to                        stenosis. Successful completion of the procedure was                        aided by withdrawing the scope and replacing with the                        pediatric colonoscope and straightening and shortening                        the scope to obtain bowel loop reduction. The patient                        tolerated the procedure well. Findings:      LA Grade A (one or more mucosal breaks less than 5 mm, not extending       between tops of 2 mucosal folds) esophagitis with no bleeding was found       at the gastroesophageal junction.      The exam of the esophagus was otherwise normal.      Segmental severe inflammation characterized by congestion (edema),       erosions and erythema was found in the gastric antrum, in the prepyloric       region of the stomach and at the pylorus. Biopsies were taken with a       cold forceps for histology. Estimated blood loss was minimal.      The cardia and gastric fundus were normal on retroflexion.      Localized mild mucosal changes characterized by congestion and erythema       were found in the third portion of the duodenum. Biopsies were taken       with a cold forceps for histology. Estimated blood loss was minimal.      The second portion of the duodenum was normal.      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      Unable to reach the third portion of the duodenum with the standard       endoscope so a pediatric colonoscope was then used to advance into the       3rd portion of the duodenum. A focal area of edematous mucosa was seen       in the 3rd portion of the duodenum that was traversed. Significant       difficulty advancing the pediatric colonoscope into the duodenal bulb       due to the edematous pyloric  channel. Mild difficulty advancing the       adult endoscope into the duodenal  bulb. No duodenal mass seen in the       examined duodenum.      -NG tube removed during the procedure. No fluid seen in the gastric       lumen. Impression:           - LA Grade A acute esophagitis.                       - Severe acute gastritis. Biopsied. Question malignant                        process vs. reactive process from intestinal                        obstruction.                       - Mucosal changes in the duodenum. Biopsied.                       - Normal second portion of the duodenum.                       - Mucosal nodule found in the duodenum. Recommendation:       - NPO except ice chips ok.                       - Consider TNA if unable to tolerate POs.                       - Await pathology results.                       - Observe patient's clinical course. Procedure Code(s):    --- Professional ---                       [phone], Esophagogastroduodenoscopy, flexible, transoral;                        with biopsy, single or multiple Diagnosis Code(s):    --- Professional ---                       R11.2, Nausea with vomiting, unspecified                       K29.00, Acute gastritis without bleeding                       K20.9, Esophagitis, unspecified                       K31.89, Other diseases of stomach and duodenum                       R93.3, Abnormal findings on diagnostic imaging of other                        parts of digestive tract CPT copyright 2016 [employer]. All rights reserved. The codes documented in this report are preliminary and upon coder review may  be revised to meet current compliance requirements. Lear Ng, MD 05/31/2017 11:36:00 AM This report has been signed electronically. Number  of Addenda: 0 Note Initiated On: 05/31/2017 10:26 AM      [facility]

## 2017-06-01 LAB — DIFFERENTIAL
Basophils Absolute: 0 10*3/uL (ref 0–0.1)
Basophils Relative: 0 %
EOS ABS: 0 10*3/uL (ref 0–0.7)
EOS PCT: 0 %
LYMPHS ABS: 0.3 10*3/uL — AB (ref 1.0–3.6)
Lymphocytes Relative: 3 %
Monocytes Absolute: 0.4 10*3/uL (ref 0.2–0.9)
Monocytes Relative: 5 %
NEUTROS PCT: 92 %
Neutro Abs: 7.2 10*3/uL — ABNORMAL HIGH (ref 1.4–6.5)

## 2017-06-01 LAB — COMPREHENSIVE METABOLIC PANEL
ALT: 13 U/L — ABNORMAL LOW (ref 14–54)
AST: 22 U/L (ref 15–41)
Albumin: 3.1 g/dL — ABNORMAL LOW (ref 3.5–5.0)
Alkaline Phosphatase: 55 U/L (ref 38–126)
Anion gap: 6 (ref 5–15)
BILIRUBIN TOTAL: 0.6 mg/dL (ref 0.3–1.2)
BUN: 26 mg/dL — ABNORMAL HIGH (ref 6–20)
CO2: 24 mmol/L (ref 22–32)
CREATININE: 0.86 mg/dL (ref 0.44–1.00)
Calcium: 8.3 mg/dL — ABNORMAL LOW (ref 8.9–10.3)
Chloride: 110 mmol/L (ref 101–111)
GFR calc Af Amer: >60 mL/min (ref >60)
GFR, EST NON AFRICAN AMERICAN: 59 mL/min — AB (ref >60)
Glucose, Bld: 195 mg/dL — ABNORMAL HIGH (ref 65–99)
POTASSIUM: 5.1 mmol/L (ref 3.5–5.1)
Sodium: 140 mmol/L (ref 135–145)
Total Protein: 5.8 g/dL — ABNORMAL LOW (ref 6.5–8.1)

## 2017-06-01 LAB — GLUCOSE, CAPILLARY
GLUCOSE-CAPILLARY: 131 mg/dL — AB (ref 65–99)
GLUCOSE-CAPILLARY: 153 mg/dL — AB (ref 65–99)
GLUCOSE-CAPILLARY: 185 mg/dL — AB (ref 65–99)
Glucose-Capillary: 181 mg/dL — ABNORMAL HIGH (ref 65–99)
Glucose-Capillary: 184 mg/dL — ABNORMAL HIGH (ref 65–99)
Glucose-Capillary: 142 mg/dL — ABNORMAL HIGH (ref 65–99)
Glucose-Capillary: 150 mg/dL — ABNORMAL HIGH (ref 65–99)

## 2017-06-01 LAB — HEPARIN LEVEL (UNFRACTIONATED)
Heparin Unfractionated: 0.73 IU/mL — ABNORMAL HIGH (ref 0.30–0.70)
Heparin Unfractionated: 0.5 IU/mL (ref 0.30–0.70)

## 2017-06-01 LAB — PREALBUMIN: PREALBUMIN: 16.7 mg/dL — AB (ref 18–38)

## 2017-06-01 LAB — PROTIME-INR
INR: 1.44
Prothrombin Time: 17.4 seconds — ABNORMAL HIGH (ref 11.4–15.2)

## 2017-06-01 LAB — CBC
HEMATOCRIT: 33.2 % — AB (ref 35.0–47.0)
Hemoglobin: 11.3 g/dL — ABNORMAL LOW (ref 12.0–16.0)
MCH: 33 pg (ref 26.0–34.0)
MCHC: 34.2 g/dL (ref 32.0–36.0)
MCV: 96.5 fL (ref 80.0–100.0)
Platelets: 162 10*3/uL (ref 150–440)
RBC: 3.44 MIL/uL — ABNORMAL LOW (ref 3.80–5.20)
RDW: 14.2 % (ref 11.5–14.5)
WBC: 7.8 10*3/uL (ref 3.6–11.0)

## 2017-06-01 LAB — MAGNESIUM: MAGNESIUM: 2.1 mg/dL (ref 1.7–2.4)

## 2017-06-01 LAB — PHOSPHORUS: PHOSPHORUS: 2.4 mg/dL — AB (ref 2.5–4.6)

## 2017-06-01 LAB — TRIGLYCERIDES: TRIGLYCERIDES: 79 mg/dL (ref <150)

## 2017-06-01 MED ORDER — SODIUM PHOSPHATES 45 MMOLE/15ML IV SOLN
7.5000 mmol | Freq: Once | INTRAVENOUS | Status: AC
  Administered 2017-06-01: 7.5 mmol via INTRAVENOUS
  Filled 2017-06-01: qty 2.5

## 2017-06-01 MED ORDER — FAT EMULSION 20 % IV EMUL
180.0000 mL | INTRAVENOUS | Status: AC
  Administered 2017-06-01: 180 mL via INTRAVENOUS
  Filled 2017-06-01: qty 200

## 2017-06-01 MED ORDER — WARFARIN SODIUM 6 MG PO TABS
6.0000 mg | ORAL_TABLET | Freq: Once | ORAL | Status: DC
  Filled 2017-06-01: qty 1

## 2017-06-01 MED ORDER — TRACE MINERALS CR-CU-MN-SE-ZN 10-1000-500-60 MCG/ML IV SOLN
INTRAVENOUS | Status: AC
  Administered 2017-06-01: 18:00:00 via INTRAVENOUS
  Filled 2017-06-01: qty 960

## 2017-06-01 NOTE — Progress Notes (Addendum)
[place] for Heparin Indication: atrial fibrillation  Allergies  Allergen Reactions  . Codeine Sulfate     Other reaction(s): Unknown  . Diphenhydramine Other (See Comments)    skin became red all over  . Ivp Dye [Iodinated Diagnostic Agents] Other (See Comments)    Skin becomes red all over  . Quinidine     Other reaction(s): Unknown Other reaction(s): Unknown    Patient Measurements: Height: 5' 2.5" (158.8 cm) Weight: 136 lb 1.6 oz (61.7 kg) IBW/kg (Calculated) : 51.25  Heparin DW: 60 kg  Vital Signs: BP: 124/58 (09/03 1139) Pulse Rate: 66 (09/03 1139)  Labs:  Recent Labs  05/30/17 0518 05/30/17 2155 05/31/17 0457  05/31/17 2130 06/01/17 0512 06/01/17 1602  HGB  --   --  11.4*  --   --  11.3*  --   HCT  --   --  33.9*  --   --  33.2*  --   PLT  --   --  172  --   --  162  --   LABPROT 38.4* 18.1* 17.7*  --   --  17.4*  --   INR 3.96 1.51 1.47  --   --  1.44  --   HEPARINUNFRC  --   --   --   < > 0.57 0.73* 0.50  CREATININE 0.93  --  0.87  --   --  0.86  --   < > = values in this interval not displayed.  Estimated Creatinine Clearance: 40.4 mL/min (by C-G formula based on SCr of 0.86 mg/dL).   Medical History: Past Medical History:  Diagnosis Date  . A-fib ([place])   . Anxiety   . Atrial fibrillation ([place])   . Cancer (HCC)    endometrial  . Depression   . Hypertension    Assessment: 83 y/o F with a known h/o atrial fibrillation on warfarin 4 mg daily PTA admitted with abdominal pain as well as intractable N/V. Last reported dose of warfarin 4mg  was 8/29.   Date INR 8/30 2.62 - held warfarin 8/31 3.1 - hold warfarin 9/1 3.96 - hold warfarin --> INR 1.51 after vitamin K 2.5 mg IV, heparin drip started  Goal of Therapy:  Anti-Xa level 0.3-0.7   Plan:  8/30 INR is therapeutic. Patient is unable to tolerate oral medications at this time. Dr. Margaretmary Eddy tentatively plans for patient to begin therapeutic Lovenox tomorrow  if INR is subtherapeutic. Will need to f/u plans for anticoagulation in AM.   8/31 INR is supratherapeutic at 3.1. Will hold warfarin. Patient is also NPO. Per MD if INR < 2 on recheck, will start them on heparin drip.   9/1 INR remains supratherapeutic at 3.96. Will hold warfarin. Patient is also NPO.  9/1 2200 INR 1.51. Heparin drip started at 850 units/hr with 3600 unit bolus. First heparin level 8 hours after start of infusion. Goal anti-Xa 0.3-0.7.  9/2 0800 First heparin level at 1.21. Per RN who went into pt's room, lab was drawn from left arm and heparin was infusing in right arm. Heparin currently stopped at 0845 for EGD. Asked RN to call pharmacy when drip needs to be restarted for order. For level of 1.21 normally would need to hold heparin drip for one hour but heparin off for procedure currently. Will need to restart heparin at 700 units/hr (=7 ml/hr). Will need to order heparin level. CBC in AM.   9/3 AM: HL 0.73 slightly above goal.  RN confirms heparin running at 7 ml/hr. Will decrease heparin drip to 650 units/hr (=6.5 ml/hr). Recheck heparin level in 8h. CBC in AM. Hgb and plt count stable from yesterday.   9/3: HL@ 1600 = 0.50.   Will continue this pt on current rate and recheck HL in 8 hrs.   Pharmacy will continue to follow.   Robbins,Jason D Clinical Pharmacist 06/01/2017 4:59 PM   9/4 0000 heparin level 0.82. Decrease rate to 550 units/hr. Recheck in 8 hours with CBC.  Sim Boast, PharmD, BCPS  06/02/17 1:22 AM

## 2017-06-01 NOTE — Care Management (Signed)
Patient placed on TPN over the weekend.  Had endoscopy which showed severe inflammation of esophagus and duodenum. Clear liquids today and hope to advanced to full.  PT and OT consults are pending. Oral cardizem is controlling heart rate.  Resuming coumadin

## 2017-06-01 NOTE — Progress Notes (Signed)
[place] for Heparin Indication: atrial fibrillation  Allergies  Allergen Reactions  . Codeine Sulfate     Other reaction(s): Unknown  . Diphenhydramine Other (See Comments)    skin became red all over  . Ivp Dye [Iodinated Diagnostic Agents] Other (See Comments)    Skin becomes red all over  . Quinidine     Other reaction(s): Unknown Other reaction(s): Unknown    Patient Measurements: Height: 5' 2.5" (158.8 cm) Weight: 136 lb 1.6 oz (61.7 kg) IBW/kg (Calculated) : 51.25  Heparin DW: 60 kg  Vital Signs: Temp: 97.7 F (36.5 C) (09/03 0457) Temp Source: Oral (09/03 0457) BP: 122/57 (09/03 0457) Pulse Rate: 73 (09/03 0457)  Labs:  Recent Labs  05/30/17 0518 05/30/17 2155 05/31/17 0457 05/31/17 0757 05/31/17 2130 06/01/17 0512  HGB  --   --  11.4*  --   --  11.3*  HCT  --   --  33.9*  --   --  33.2*  PLT  --   --  172  --   --  162  LABPROT 38.4* 18.1* 17.7*  --   --  17.4*  INR 3.96 1.51 1.47  --   --  1.44  HEPARINUNFRC  --   --   --  1.21* 0.57 0.73*  CREATININE 0.93  --  0.87  --   --  0.86    Estimated Creatinine Clearance: 40.4 mL/min (by C-G formula based on SCr of 0.86 mg/dL).   Medical History: Past Medical History:  Diagnosis Date  . A-fib ([place])   . Anxiety   . Atrial fibrillation ([place])   . Cancer (HCC)    endometrial  . Depression   . Hypertension    Assessment: 83 y/o F with a known h/o atrial fibrillation on warfarin 4 mg daily PTA admitted with abdominal pain as well as intractable N/V. Last reported dose of warfarin 4mg  was 8/29.   Date INR 8/30 2.62 - held warfarin 8/31 3.1 - hold warfarin 9/1 3.96 - hold warfarin --> INR 1.51 after vitamin K 2.5 mg IV, heparin drip started  Goal of Therapy:  Anti-Xa level 0.3-0.7   Plan:  8/30 INR is therapeutic. Patient is unable to tolerate oral medications at this time. Dr. Margaretmary Eddy tentatively plans for patient to begin therapeutic Lovenox tomorrow if INR  is subtherapeutic. Will need to f/u plans for anticoagulation in AM.   8/31 INR is supratherapeutic at 3.1. Will hold warfarin. Patient is also NPO. Per MD if INR < 2 on recheck, will start them on heparin drip.   9/1 INR remains supratherapeutic at 3.96. Will hold warfarin. Patient is also NPO.  9/1 2200 INR 1.51. Heparin drip started at 850 units/hr with 3600 unit bolus. First heparin level 8 hours after start of infusion. Goal anti-Xa 0.3-0.7.  9/2 0800 First heparin level at 1.21. Per RN who went into pt's room, lab was drawn from left arm and heparin was infusing in right arm. Heparin currently stopped at 0845 for EGD. Asked RN to call pharmacy when drip needs to be restarted for order. For level of 1.21 normally would need to hold heparin drip for one hour but heparin off for procedure currently. Will need to restart heparin at 700 units/hr (=7 ml/hr). Will need to order heparin level. CBC in AM.   9/3 AM: HL 0.73 slightly above goal. RN confirms heparin running at 7 ml/hr. Will decrease heparin drip to 650 units/hr (=6.5 ml/hr). Recheck heparin  level in 8h. CBC in AM. Hgb and plt count stable from yesterday.   Pharmacy will continue to follow.   Rayna Sexton, PharmD, BCPS Clinical Pharmacist 06/01/2017 7:36 AM

## 2017-06-01 NOTE — Progress Notes (Signed)
PHARMACY - ADULT TOTAL PARENTERAL NUTRITION CONSULT NOTE   Pharmacy Consult for TPN Indication: bowel obstruction  Patient Measurements: Height: 5' 2.5" (158.8 cm) Weight: 136 lb 1.6 oz (61.7 kg) IBW/kg (Calculated) : 51.25 TPN AdjBW (KG): 60.3 Body mass index is 24.5 kg/m.   Assessment:  83 yo female started on TPN Clinimix 5/15 w/o electrolytes at 40 ml/hr on 9/2.  Endo:  No hx of diabetes. BG 132-185  Insulin requirements in the past 24 hours: 9 units SSI (received dose of dexamethasone 9/2) Lytes: K 5.1, Mag 2.1, Phos 2.4 (no lytes in TPN) Renal: SCr 0.86  Best Practices: TPN Access: PICC started 9/2 TPN start date: 9/2  Nutritional Goals (per RD recommendation on 9/3): At goal, provides 1468 calories, 78 grams of protein, and 1590mL total volume Goal TPN rate of 65 ml/hr  Current Nutrition: CLD started 9/3  Plan:  Will order Na Phos 7.5 mmol IV x1  Continue Clinimix 5/15 without electrolytes at 77mL/hr with MVI and trace elements; add 20% IVLE at 41mL/hr x12 hrs per discussion with dietician.  Continue SSI 0-9 q4h   F/U TPN labs and glucose in AM  Mina Marble, Marissa Weaver L 06/01/2017,7:59 AM

## 2017-06-01 NOTE — Progress Notes (Signed)
Patient ID: Crystal Landry, female   DOB: Feb 07, 1927, 83 y.o.   MRN: [id number]   [place] PROGRESS NOTE  MARICELLA FILYAW HUT:[id number] DOB: Dec 22, 1926 DOA: 05/28/2017 PCP: [name], MD  HPI/Subjective: Patient feeling okay, no abdominal pain. Tolerated liquids this morning. Family and patient concern that she took a blue pill last night. I spoke with the nursing staff and the only thing that was documented at the time they stated this was a blue pill was Tylenol. I spoke with the pharmacist and the only blue pill was the Coumadin and she took that at 1825. The pill at 2151 was marked as a Tylenol. The pharmacist states Tylenol is white. Family concerned that the patient was double dosed on Coumadin.  Objective: Vitals:   06/01/17 0457 06/01/17 1139  BP: (!) 122/57 (!) 124/58  Pulse: 73 66  Resp:  18  Temp: 97.7 F (36.5 C)   SpO2: 94% 98%    Filed Weights   05/28/17 1211 05/31/17 1426 06/01/17 0457  Weight: 60.3 kg (133 lb) 59.4 kg (130 lb 14.4 oz) 61.7 kg (136 lb 1.6 oz)    ROS: Review of Systems  Constitutional: Negative for chills and fever.  Eyes: Negative for blurred vision.  Respiratory: Negative for cough and shortness of breath.   Cardiovascular: Negative for chest pain.  Gastrointestinal: Negative for abdominal pain, constipation, diarrhea, nausea and vomiting.  Genitourinary: Negative for dysuria.  Musculoskeletal: Negative for joint pain.  Neurological: Negative for dizziness and headaches.   Exam: Physical Exam  Constitutional: She is oriented to person, place, and time.  HENT:  Nose: No mucosal edema.  Mouth/Throat: No oropharyngeal exudate or posterior oropharyngeal edema.  Eyes: Pupils are equal, round, and reactive to light. Conjunctivae, EOM and lids are normal.  Neck: No JVD present. Carotid bruit is not present. No edema present. No thyroid mass and no thyromegaly present.  Cardiovascular: S1 normal and S2 normal.  Exam reveals no  gallop.   Murmur heard.  Systolic murmur is present with a grade of 4/6  Pulses:      Dorsalis pedis pulses are 2+ on the right side, and 2+ on the left side.  Metallic heart valve  Respiratory: No respiratory distress. She has no wheezes. She has no rhonchi. She has no rales.  GI: Soft. Bowel sounds are normal. There is no tenderness.  Musculoskeletal:       Right shoulder: She exhibits no swelling.  Lymphadenopathy:    She has no cervical adenopathy.  Neurological: She is alert and oriented to person, place, and time. No cranial nerve deficit.  Skin: Skin is warm. No rash noted. Nails show no clubbing.  Psychiatric: She has a normal mood and affect.      Data Reviewed: Basic Metabolic Panel:  Recent Labs Lab 05/28/17 1212 05/29/17 0416 05/30/17 0518 05/31/17 0457 06/01/17 0512  NA 138 139 144 143 140  K 3.8 3.6 4.4 4.5 5.1  CL 97* 102 111 111 110  CO2 29 27 26 22 24   GLUCOSE 112* 94 67 64* 195*  BUN 22* 20 22* 21* 26*  CREATININE 1.48* 1.39* 0.93 0.87 0.86  CALCIUM 9.7 8.6* 8.2* 8.2* 8.3*  MG  --   --  2.1 2.0 2.1  PHOS  --   --   --  2.7 2.4*   Liver Function Tests:  Recent Labs Lab 05/28/17 1212 05/29/17 0416 06/01/17 0512  AST 33 22 22  ALT 17 14 13*  ALKPHOS 75 61  60  BILITOT 1.1 1.1 0.6  PROT 7.6 6.2* 5.8*  ALBUMIN 4.5 3.7 3.1*    Recent Labs Lab 05/28/17 1212  LIPASE 36   CBC:  Recent Labs Lab 05/28/17 1212 05/29/17 0416 05/31/17 0457 06/01/17 0512  WBC 8.6 9.3 8.4 7.8  NEUTROABS  --   --   --  7.2*  HGB 13.7 12.6 11.4* 11.3*  HCT 39.8 37.8 33.9* 33.2*  MCV 97.6 97.7 98.2 96.5  PLT 221 188 172 162      Studies: Dg Chest Port 1 View  Result Date: 05/31/2017 CLINICAL DATA:  PICC line placement EXAM: PORTABLE CHEST 1 VIEW COMPARISON:  05/28/2017 FINDINGS: RIGHT PICC line tip in distal SVC. Normal cardiac silhouette. No pulmonary edema. No pneumothorax IMPRESSION: RIGHT PICC line in good position Electronically Signed   By: Suzy Bouchard M.D.   On: 05/31/2017 18:45    Scheduled Meds: . diltiazem  300 mg Oral Daily  . insulin aspart  0-9 Units Subcutaneous Q4H  . pantoprazole (PROTONIX) IV  40 mg Intravenous Q12H  . warfarin  6 mg Oral ONCE-1800  . Warfarin - Pharmacist Dosing Inpatient   Does not apply q1800   Continuous Infusions: . fat emulsion    . heparin 650 Units/hr (06/01/17 0758)  . sodium phosphate  Dextrose 5% IVPB 7.5 mmol (06/01/17 1138)  . TPN (CLINIMIX) Adult without lytes 40 mL/hr at 05/31/17 2101  . TPN (CLINIMIX) Adult without lytes      Assessment/Plan:  1. Bowel obstruction. Severe inflammation and esophagus stomach and duodenum. Start clear liquid diet today. GI recommended TPN while here. Hopefully can advance to full liquid diet tomorrow. May have to be on pured diet upon going home.  GI recommended possible Endoscopic ultrasound if biopsies are negative. 2. Atrial fibrillation. On heparin drip, Coumadin started. Cardizem CD for heart rate control. 3. Acute kidney injury. Improved with IV fluid hydration 4. Left-sided hydronephrosis and hydroureter. Urology on call on admission recommended outpatient follow-up 5. Hyperlipidemia unspecified. Hold on statin for now 6. Metallic heart valve. Coumadin restarted. Heparin drip continued. Check INR daily 7. Family concerned about double dosing of Coumadin. Case discussed with nurse in charge and nurse caring for the patient. An incident report was filled out. They will look into the matter. Check INR daily.  Code Status:     Code Status Orders        Start     Ordered   05/28/17 1732  Full code  Continuous     05/28/17 1732    Code Status History    Date Active Date Inactive Code Status Order ID Comments User Context   This patient has a current code status but no historical code status.    Advance Directive Documentation     Most Recent Value  Type of Advance Directive  Healthcare Power of Attorney  Pre-existing out of facility  DNR order (yellow form or pink MOST form)  -  "MOST" Form in Place?  -     Family Communication: Sons at the bedside Disposition Plan: to be determined  Consultants:  Gastroenterology  Surgery  Cardiology  Time spent: 35 minutes  [place], [place]

## 2017-06-01 NOTE — Anesthesia Postprocedure Evaluation (Signed)
Anesthesia Post Note  Patient: Crystal Landry  Procedure(s) Performed: Procedure(s) (LRB): ESOPHAGOGASTRODUODENOSCOPY (EGD) WITH PROPOFOL (N/A)  Patient location during evaluation: PACU Anesthesia Type: General Level of consciousness: awake and alert and oriented Pain management: pain level controlled Vital Signs Assessment: post-procedure vital signs reviewed and stable Respiratory status: spontaneous breathing Cardiovascular status: blood pressure returned to baseline Anesthetic complications: no     Last Vitals:  Vitals:   05/31/17 1228 05/31/17 2007  BP: (!) 144/61 (!) 126/48  Pulse: 85 75  Resp: 18   Temp: 36.8 C 36.6 C  SpO2: 95% 94%    Last Pain:  Vitals:   05/31/17 2007  TempSrc: Oral  PainSc:                  Hudsyn Champine

## 2017-06-01 NOTE — Evaluation (Signed)
Physical Therapy Evaluation Patient Details Name: Crystal Landry MRN: [id number] DOB: [birth year] Today's Date: 06/01/2017   History of Present Illness  [name] came to ED with a-fib/RVR and weakness. Patient has PMH significatn for endometrial CA, depression, HTN, abdominal pain; CT scan showed possible blockage in esophagus. Patient underwent EGD on 9/3 for GI bleed.   Clinical Impression  [name] came to ED with abdominal pain; She was found to have possible blockage/GI bleed and is s/p EGD for diagnosis. Patient was living alone prior to admittance. She reports walking with SPC but states that she did have a recent fall a few weeks ago. Patient has past meniscal tear in RLE knee and reports that occasionally her right knee will buckle. She requires supervision for bed mobility, min A for sit<>Stand transfers. Patient ambulated in room x15 feet with RW, min A for safety. Following gait, patient was very tired. Concerned about patient's low endurance and weakness limiting her ability to care for self. Her son reports that he could stay with her for a few days but she does not have full-time assistance. Patient would benefit from skilled PT intervention to improve strength, balance and gait safety; Recommend STR based on limited mobility today; If patient does well in acute care she might be able to go home with home health provided that she was more functionally mobile due to limited caregiver support. 3    Follow Up Recommendations SNF    Equipment Recommendations  None recommended by PT    Recommendations for Other Services Rehab consult     Precautions / Restrictions Precautions Precautions: Fall Restrictions Weight Bearing Restrictions: No      Mobility  Bed Mobility Overal bed mobility: Needs Assistance Bed Mobility: Supine to Sit     Supine to sit: Supervision;HOB elevated     General bed mobility comments: utilized bed rails; required min VCs for hand placement  and positioning;   Transfers Overall transfer level: Needs assistance Equipment used: Rolling walker (2 wheeled) Transfers: Sit to/from Stand Sit to Stand: Min assist         General transfer comment: required min VCs for hand placement and to improve forward weight shift for better transfer ability;   Ambulation/Gait Ambulation/Gait assistance: Min assist Ambulation Distance (Feet): 15 Feet Assistive device: Rolling walker (2 wheeled) Gait Pattern/deviations: Step-to pattern;Step-through pattern;Decreased step length - right;Decreased step length - left;Decreased dorsiflexion - right;Decreased dorsiflexion - left;Narrow base of support;Shuffle;Trunk flexed   Gait velocity interpretation: <1.8 ft/sec, indicative of risk for recurrent falls General Gait Details: demonstrates short shuffled steps; Patient able to step through following initial step to pattern; patient ambulates at slower gait speed; requires min A for safety especially with turns;   Stairs            Wheelchair Mobility    Modified Rankin (Stroke Patients Only)       Balance Overall balance assessment: Needs assistance Sitting-balance support: Bilateral upper extremity supported;Feet supported Sitting balance-Leahy Scale: Fair     Standing balance support: Bilateral upper extremity supported Standing balance-Leahy Scale: Fair (fair -)                               Pertinent Vitals/Pain Pain Assessment: No/denies pain    Home Living Family/patient expects to be discharged to:: Private residence Living Arrangements: Alone Available Help at Discharge: Family;Available PRN/intermittently (son could stay with her for a few days when she  goes home. ) Type of Home: House Home Access: Stairs to enter Entrance Stairs-Rails: None Entrance Stairs-Number of Steps: 1 Home Layout: Able to live on main level with bedroom/bathroom;Other (Comment) (has basement, but doesn't use. ) Home Equipment:  Walker - 2 wheels;Cane - single point;Shower seat;Grab bars - tub/shower;Grab bars - toilet      Prior Function Level of Independence: Independent with assistive device(s)         Comments: used cane for most ambulation; would cook/do light housework; Patient reports being mod I for self care bathing/dressing;      Hand Dominance        Extremity/Trunk Assessment   Upper Extremity Assessment Upper Extremity Assessment: Overall WFL for tasks assessed    Lower Extremity Assessment Lower Extremity Assessment: RLE deficits/detail;LLE deficits/detail RLE Deficits / Details: intact light touch sensation; ROM is WFL; strength grossly 3+/5 LLE Deficits / Details: intact light touch sensation; ROM is WFL; strength grossly 3+/5    Cervical / Trunk Assessment Cervical / Trunk Assessment: Kyphotic  Communication   Communication: No difficulties  Cognition Arousal/Alertness: Awake/alert Behavior During Therapy: WFL for tasks assessed/performed Overall Cognitive Status: Within Functional Limits for tasks assessed                                        General Comments General comments (skin integrity, edema, etc.): grossly intact; no swelling noted;     Exercises     Assessment/Plan    PT Assessment Patient needs continued PT services  PT Problem List Decreased strength;Decreased mobility;Decreased safety awareness;Decreased activity tolerance;Decreased balance       PT Treatment Interventions DME instruction;Therapeutic activities;Gait training;Therapeutic exercise;Patient/family education;Stair training;Balance training;Functional mobility training;Neuromuscular re-education    PT Goals (Current goals can be found in the Care Plan section)  Acute Rehab PT Goals Patient Stated Goal: "I want to go home."  PT Goal Formulation: With patient/family Time For Goal Achievement: 06/15/17 Potential to Achieve Goals: Good    Frequency Min 2X/week   Barriers to  discharge Decreased caregiver support lives alone; family could help some but unsure how long they could stay if she needed it.     Co-evaluation               AM-PAC PT "6 Clicks" Daily Activity  Outcome Measure Difficulty turning over in bed (including adjusting bedclothes, sheets and blankets)?: A Lot Difficulty moving from lying on back to sitting on the side of the bed? : Unable Difficulty sitting down on and standing up from a chair with arms (e.g., wheelchair, bedside commode, etc,.)?: A Lot Help needed moving to and from a bed to chair (including a wheelchair)?: A Little Help needed walking in hospital room?: A Little Help needed climbing 3-5 steps with a railing? : Total 6 Click Score: 12    End of Session Equipment Utilized During Treatment: Gait belt Activity Tolerance: Patient limited by fatigue;No increased pain Patient left: in chair;with call bell/phone within reach;with family/visitor present;with chair alarm set Nurse Communication: Mobility status PT Visit Diagnosis: Unsteadiness on feet (R26.81);Muscle weakness (generalized) (M62.81)    Time: 4010-2725 PT Time Calculation (min) (ACUTE ONLY): 22 min   Charges:   PT Evaluation $PT Eval Low Complexity: 1 Low     PT G Codes:   PT G-Codes **NOT FOR INPATIENT CLASS** Functional Assessment Tool Used: AM-PAC 6 Clicks Basic Mobility;Clinical judgement Functional Limitation: Mobility: Walking and moving  around Mobility: Walking and Moving Around Current Status [phone]): At least 60 percent but less than 80 percent impaired, limited or restricted Mobility: Walking and Moving Around Goal Status [phone]): At least 40 percent but less than 60 percent impaired, limited or restricted      Aprille Sawhney PT, DPT 06/01/2017, 12:56 PM

## 2017-06-01 NOTE — Progress Notes (Signed)
Will hold tonights dose of Coumadin in case the patient did get double Coumadin.  Pharmacy cannot confirm the double dose as family reported a blue pill which would not be Coumadin.  This mornings INR was 1.44 which is not near therapeutic.  Patient is still on therapeutic Heparin. Will recheck INR tomorrow and continue to monitor from there.  Charlane Ferretti, RPh 06/01/2017 1438

## 2017-06-01 NOTE — Progress Notes (Signed)
OT Cancellation Note  Patient Details Name: Crystal Landry MRN: [id number] DOB: Mar 11, 1927   Cancelled Treatment:    Reason Eval/Treat Not Completed: Patient declined, no reason specified. Order received, chart reviewed. On first attempt, pt working with PT. On second attempt, pt reports just getting back to bed and very fatigued, requesting OT come back next date. Pt agreeable to OT Evaluation next morning. Will re-attempt in am.   Jeni Salles, MPH, MS, OTR/L ascom [phone] 06/01/17, 1:32 PM

## 2017-06-01 NOTE — Progress Notes (Signed)
[facility] for Warfarin Indication: atrial fibrillation  Allergies  Allergen Reactions  . Codeine Sulfate     Other reaction(s): Unknown  . Diphenhydramine Other (See Comments)    skin became red all over  . Ivp Dye [Iodinated Diagnostic Agents] Other (See Comments)    Skin becomes red all over  . Quinidine     Other reaction(s): Unknown Other reaction(s): Unknown    Patient Measurements: Height: 5' 2.5" (158.8 cm) Weight: 136 lb 1.6 oz (61.7 kg) IBW/kg (Calculated) : 51.25  Heparin DW: 60 kg  Vital Signs: Temp: 97.7 F (36.5 C) (09/03 0457) Temp Source: Oral (09/03 0457) BP: 122/57 (09/03 0457) Pulse Rate: 73 (09/03 0457)  Labs:  Recent Labs  05/30/17 0518 05/30/17 2155 05/31/17 0457 05/31/17 0757 05/31/17 2130 06/01/17 0512  HGB  --   --  11.4*  --   --  11.3*  HCT  --   --  33.9*  --   --  33.2*  PLT  --   --  172  --   --  162  LABPROT 38.4* 18.1* 17.7*  --   --  17.4*  INR 3.96 1.51 1.47  --   --  1.44  HEPARINUNFRC  --   --   --  1.21* 0.57 0.73*  CREATININE 0.93  --  0.87  --   --  0.86    Estimated Creatinine Clearance: 40.4 mL/min (by C-G formula based on SCr of 0.86 mg/dL).   Medical History: Past Medical History:  Diagnosis Date  . A-fib ([place])   . Anxiety   . Atrial fibrillation ([place])   . Cancer ([facility])    endometrial  . Depression   . Hypertension    Assessment: 83 y/o F with a known h/o atrial fibrillation and mechnical mitral valve on warfarin 4 mg daily PTA admitted with abdominal pain as well as intractable N/V. Pt warfarin was held and was given vit K to reduce INR for endoscopy.  Date INR 8/30 2.62 - held warfarin 8/31 3.1 - hold warfarin 9/1 3.96 - hold warfarin, Vit K 2.5mg  IV given bc pt needed procedure --> INR 1.51 9/2       1.47 - warfarin 6 mg 9/3 1.44  Goal of Therapy:  INR 2.5-3.5  Plan:  Will repeat warfarin 6 mg PO x1 for tonight as pt has received vitamin K, INR subtherapeutic  and unchanged, and higher INR goal.  Follow INR. Continue heparin drip until INR therapeutic x2. Goal INR is 2.5-3.5. Follow INR closely due to diet changes-pt started on TPN 9/2  Pharmacy will continue to follow.   Rayna Sexton, PharmD, BCPS Clinical Pharmacist 06/01/2017 7:47 AM

## 2017-06-01 NOTE — Progress Notes (Signed)
Initial Nutrition Assessment  DOCUMENTATION CODES:   Not applicable  INTERVENTION:  Recommend continue Clinimix 5/15 without electrolytes at 8mL/hr Add 20% IVLE at 12mL/hr x12 hrs If unable to advance diet to full liquids within the next 24-48 hours, recommend advance Clinimix 5/15 to goal rate of 35mL/hr  At goal, provides 1468 calories, 78 grams of protein, and 1521mL total volume  NUTRITION DIAGNOSIS:   Inadequate oral intake related to poor appetite, nausea, vomiting as evidenced by per patient/family report.  GOAL:   Patient will meet greater than or equal to 90% of their needs  MONITOR:   PO intake, I & O's, Labs, Diet advancement, Weight trends, Skin  REASON FOR ASSESSMENT:   Consult New TPN/TNA  ASSESSMENT:   83 yo female with PMHHx HTN, HLD, Endometrial cancer duodenal obstruction, AKI, L-sided hydronephrosis and hydroureter, EGD done yesterday found to have a severely edematous distal stomach with a functional obstruction. EUS and MRCP being considered. Advanced to clears today. TPN started 05/31/2017  Spoke with patient at bedside. She is hungry today, consuming liquids complaining of not receiving more food. She reports PTA she normally eats Raisin bran with mixed nuts for breakfast, a chicken salad or tuna salad sandwich for lunch and something cooked or a stoffer's meal for dinner. She also normally consumes an ensure as well. Complains she had diarrhea this past month causing her to lose 4 pounds.  Consumed 100% of liquids for lunch. Discussed with GI, RN, and Internal Medicine. Appears patient ate very little for 1 week PTA, had functional bowel obstruction. Weight has returned to her previous weight.  There is concern that she can tolerate liquids, but not solid food, awaiting pathology results from her biopsy.   Monitor PO intake and tolerance.  Nutrition-Focused physical exam completed. Findings are moderate fat depletion at orbitals and arms, moderate  muscle depletion at temples, and no edema.   Access: R PICC   Intake/Output Summary (Last 24 hours) at 06/01/17 1345 Last data filed at 06/01/17 1200  Gross per 24 hour  Intake          2004.87 ml  Output              700 ml  Net          1304.87 ml   Labs reviewed:  CBGs 184, 185, 185 Phos 2.4  Medications reviewed and include:  Heparin gtt Novolog 0-9 Units every 4 hours,    Diet Order:  TPN (CLINIMIX) Adult without lytes Diet clear liquid Room service appropriate? Yes; Fluid consistency: Thin  Skin:  Reviewed, no issues  Last BM:  05/31/2017  Height:   Ht Readings from Last 1 Encounters:  05/28/17 5' 2.5" (1.588 m)    Weight:   Wt Readings from Last 1 Encounters:  06/01/17 136 lb 1.6 oz (61.7 kg)    Ideal Body Weight:  51.13 kg  BMI:  Body mass index is 24.5 kg/m.  Estimated Nutritional Needs:   Kcal:  1400-1600 calories  Protein:  74-93 grams (1.2-1.5g/kg)  Fluid:  > 1.5L  EDUCATION NEEDS:   No education needs identified at this time  Crystal Landry. Crystal Amundson, MS, RD LDN Inpatient Clinical Dietitian Pager [phone]

## 2017-06-01 NOTE — Progress Notes (Signed)
Gastroenterology Progress Note    Crystal Landry 83 y.o. 1927-05-18   Subjective: Feels ok. Denies abdominal pain. On TPN. Sons at bedside.   Objective: Vital signs in last 24 hours: Vitals:   05/31/17 2007 06/01/17 0457  BP: (!) 126/48 (!) 122/57  Pulse: 75 73  Resp:    Temp: 97.9 F (36.6 Landry) 97.7 F (36.5 Landry)  SpO2: 94% 94%  R 18  Physical Exam: Gen: alert, elderly, frail, thin, no acute distress CV: RRR Chest: CTA B Abd: soft, nontender, nondistended, +BS  Lab Results:  Recent Labs  05/31/17 0457 06/01/17 0512  NA 143 140  K 4.5 5.1  CL 111 110  CO2 22 24  GLUCOSE 64* 195*  BUN 21* 26*  CREATININE 0.87 0.86  CALCIUM 8.2* 8.3*  MG 2.0 2.1  PHOS 2.7 2.4*    Recent Labs  06/01/17 0512  AST 22  ALT 13*  ALKPHOS 55  BILITOT 0.6  PROT 5.8*  ALBUMIN 3.1*    Recent Labs  05/31/17 0457 06/01/17 0512  WBC 8.4 7.8  NEUTROABS  --  7.2*  HGB 11.4* 11.3*  HCT 33.9* 33.2*  MCV 98.2 96.5  PLT 172 162    Recent Labs  05/31/17 0457 06/01/17 0512  LABPROT 17.7* 17.4*  INR 1.47 1.44      Assessment/Plan: Bowel obstruction - EGD with severely edematous distal stomach of unclear etiology with a functional obstruction. Duodenal inflammation in 3rd portion of the duodenum with mild narrowing but no obstruction. No definite mass seen. Biopsies pending. TPN started. Continue supportive care. Await path. Discussed findings with patient and answered family's questions. May need an EUS to evaluate the pancreas and gastric wall if biopsies are inconclusive. Also consider MRCP instead of an EUS but await pathology. Clear liquid diet ok. Dr. Vicente Males to f/u tomorrow.   Crystal Landry. 06/01/2017, 9:35 AMPatient ID: Crystal Landry, female   DOB: 1927/01/07, 83 y.o.   MRN: [id number]

## 2017-06-02 DIAGNOSIS — R109 Unspecified abdominal pain: Secondary | ICD-10-CM

## 2017-06-02 LAB — CBC
HEMATOCRIT: 33.9 % — AB (ref 35.0–47.0)
HEMATOCRIT: 31.9 % — AB (ref 35.0–47.0)
HEMOGLOBIN: 11.2 g/dL — AB (ref 12.0–16.0)
Hemoglobin: 10.8 g/dL — ABNORMAL LOW (ref 12.0–16.0)
MCH: 32.6 pg (ref 26.0–34.0)
MCH: 32.6 pg (ref 26.0–34.0)
MCHC: 33 g/dL (ref 32.0–36.0)
MCHC: 33.7 g/dL (ref 32.0–36.0)
MCV: 98.9 fL (ref 80.0–100.0)
MCV: 96.7 fL (ref 80.0–100.0)
Platelets: 172 10*3/uL (ref 150–440)
Platelets: 176 10*3/uL (ref 150–440)
RBC: 3.3 MIL/uL — ABNORMAL LOW (ref 3.80–5.20)
RBC: 3.43 MIL/uL — AB (ref 3.80–5.20)
RDW: 15 % — ABNORMAL HIGH (ref 11.5–14.5)
RDW: 14.5 % (ref 11.5–14.5)
WBC: 9.5 10*3/uL (ref 3.6–11.0)
WBC: 12.4 10*3/uL — AB (ref 3.6–11.0)

## 2017-06-02 LAB — BASIC METABOLIC PANEL
ANION GAP: 4 — AB (ref 5–15)
BUN: 23 mg/dL — ABNORMAL HIGH (ref 6–20)
CALCIUM: 8 mg/dL — AB (ref 8.9–10.3)
CHLORIDE: 112 mmol/L — AB (ref 101–111)
CO2: 22 mmol/L (ref 22–32)
Creatinine, Ser: 0.79 mg/dL (ref 0.44–1.00)
GFR calc Af Amer: >60 mL/min (ref >60)
GFR calc non Af Amer: >60 mL/min (ref >60)
GLUCOSE: 141 mg/dL — AB (ref 65–99)
POTASSIUM: 4.1 mmol/L (ref 3.5–5.1)
Sodium: 138 mmol/L (ref 135–145)

## 2017-06-02 LAB — TYPE AND SCREEN
ABO/RH(D): O NEG
ANTIBODY SCREEN: POSITIVE
UNIT DIVISION: 0
Unit division: 0

## 2017-06-02 LAB — BPAM RBC
BLOOD PRODUCT EXPIRATION DATE: 201809132359
BLOOD PRODUCT EXPIRATION DATE: 201809132359
UNIT TYPE AND RH: 9500
UNIT TYPE AND RH: 9500

## 2017-06-02 LAB — MAGNESIUM: Magnesium: 1.9 mg/dL (ref 1.7–2.4)

## 2017-06-02 LAB — HEPARIN LEVEL (UNFRACTIONATED): Heparin Unfractionated: 0.82 IU/mL — ABNORMAL HIGH (ref 0.30–0.70)

## 2017-06-02 LAB — GLUCOSE, CAPILLARY
GLUCOSE-CAPILLARY: 111 mg/dL — AB (ref 65–99)
GLUCOSE-CAPILLARY: 109 mg/dL — AB (ref 65–99)
GLUCOSE-CAPILLARY: 124 mg/dL — AB (ref 65–99)
Glucose-Capillary: 106 mg/dL — ABNORMAL HIGH (ref 65–99)
Glucose-Capillary: 111 mg/dL — ABNORMAL HIGH (ref 65–99)
Glucose-Capillary: 122 mg/dL — ABNORMAL HIGH (ref 65–99)

## 2017-06-02 LAB — PROTIME-INR
INR: 2.88
Prothrombin Time: 29.9 seconds — ABNORMAL HIGH (ref 11.4–15.2)

## 2017-06-02 LAB — PHOSPHORUS: Phosphorus: 1.7 mg/dL — ABNORMAL LOW (ref 2.5–4.6)

## 2017-06-02 MED ORDER — SODIUM CHLORIDE 0.9% FLUSH: 10.0000 mL | INTRAVENOUS | Status: DC | PRN

## 2017-06-02 MED ORDER — M.V.I. ADULT IV INJ
INJECTION | INTRAVENOUS | Status: DC
  Filled 2017-06-02: qty 960

## 2017-06-02 MED ORDER — FAT EMULSION 20 % IV EMUL
180.0000 mL | INTRAVENOUS | Status: AC
  Administered 2017-06-02: 250 mL via INTRAVENOUS
  Filled 2017-06-02 (×3): qty 250

## 2017-06-02 MED ORDER — SODIUM PHOSPHATES 45 MMOLE/15ML IV SOLN
30.0000 mmol | Freq: Once | INTRAVENOUS | Status: AC
  Administered 2017-06-02: 30 mmol via INTRAVENOUS
  Filled 2017-06-02: qty 10

## 2017-06-02 MED ORDER — TRACE MINERALS CR-CU-MN-SE-ZN 10-1000-500-60 MCG/ML IV SOLN
INTRAVENOUS | Status: AC
  Administered 2017-06-02: 21:00:00 via INTRAVENOUS
  Filled 2017-06-02 (×3): qty 960

## 2017-06-02 MED ORDER — SODIUM CHLORIDE 0.9% FLUSH
10.0000 mL | Freq: Two times a day (BID) | INTRAVENOUS | Status: DC
Start: 2017-06-02 — End: 2017-06-12
  Administered 2017-06-02: 30 mL
  Administered 2017-06-03 – 2017-06-05 (×6): 10 mL
  Administered 2017-06-06: 3 mL
  Administered 2017-06-06 – 2017-06-11 (×7): 10 mL

## 2017-06-02 MED ORDER — DILTIAZEM HCL ER COATED BEADS 120 MG PO CP24
240.0000 mg | ORAL_CAPSULE | Freq: Every day | ORAL | Status: DC
  Administered 2017-06-03 – 2017-06-04 (×2): 240 mg via ORAL
  Filled 2017-06-02 (×2): qty 2

## 2017-06-02 NOTE — Progress Notes (Signed)
   [name] MD, MRCP(U.K) [address]  [place]  [place], [place] [address]  Main: [phone]    Crystal Landry is being followed for vomiting    Subjective: No further vomiting    Objective: Vital signs in last 24 hours: Vitals:   06/01/17 1914 06/02/17 0314 06/02/17 0719 06/02/17 1238  BP: (!) 102/42 (!) 113/48 (!) 116/57 124/62  Pulse: (!) 51 (!) 45 (!) 36 88  Resp: 18 17 16 16   Temp: 97.9 F (36.6 C) 98.1 F (36.7 C) (!) 97.5 F (36.4 C) 98.2 F (36.8 C)  TempSrc: Oral Oral Oral Oral  SpO2: 93% 94% 95% 99%  Weight:  139 lb 1.6 oz (63.1 kg)    Height:       Weight change: 8 lb 3.2 oz (3.719 kg)  Intake/Output Summary (Last 24 hours) at 06/02/17 1332 Last data filed at 06/02/17 1100  Gross per 24 hour  Intake          1790.36 ml  Output             3000 ml  Net         -1209.64 ml     Exam: Heart:: Regular rate and rhythm, S1S2 present or without murmur or extra heart sounds Lungs: normal, clear to auscultation and clear to auscultation and percussion Abdomen: soft, nontender, normal bowel sounds   Lab Results: @LABTEST2 @ Micro Results: Recent Results (from the past 240 hour(s))  Urine culture     Status: None   Collection Time: 05/28/17  3:48 PM  Result Value Ref Range Status   Specimen Description URINE, CLEAN CATCH  Final   Special Requests Immunocompromised  Final   Culture   Final    NO GROWTH Performed at [facility], [address], [place], [place] [address]    Report Status 05/30/2017 FINAL  Final   Studies/Results: Dg Chest Port 1 View  Result Date: 05/31/2017 CLINICAL DATA:  PICC line placement EXAM: PORTABLE CHEST 1 VIEW COMPARISON:  05/28/2017 FINDINGS: RIGHT PICC line tip in distal SVC. Normal cardiac silhouette. No pulmonary edema. No pneumothorax IMPRESSION: RIGHT PICC line in good position Electronically Signed   By: Suzy Bouchard M.D.   On: 05/31/2017 18:45   Medications: I have reviewed the patient's  current medications. Scheduled Meds: . diltiazem  300 mg Oral Daily  . insulin aspart  0-9 Units Subcutaneous Q4H  . pantoprazole (PROTONIX) IV  40 mg Intravenous Q12H  . Warfarin - Pharmacist Dosing Inpatient   Does not apply q1800   Continuous Infusions: . TPN (CLINIMIX) Adult without lytes 40 mL/hr at 06/01/17 1806   PRN Meds:.acetaminophen **OR** acetaminophen, lidocaine, LORazepam, metoprolol tartrate, ondansetron **OR** ondansetron (ZOFRAN) IV, temazepam   Assessment: Active Problems:   Atrial fibrillation with RVR (HCC)   Encounter for imaging study to confirm nasogastric (NG) tube placement  Crystal Landry is a 83 y.o. y/o female admitted with nausea, vomiting and diarrhea. CT scan concerning for duodenal obstruction.EGD with severely edematous distal stomach of unclear etiology with a functional obstruction. Duodenal inflammation in 3rd portion of the duodenum with mild narrowing but no obstruction. No definite mass seen. Biopsies pending.On TPN.  Plan  1. Continue TPN- oral diet as tolerated can be advanced 2. PPI 3. F/u biopsies    LOS: 5 days   [name] 06/02/2017, 1:32 PM

## 2017-06-02 NOTE — Progress Notes (Signed)
PHARMACY - ADULT TOTAL PARENTERAL NUTRITION CONSULT NOTE   Pharmacy Consult for TPN Indication: bowel obstruction  Patient Measurements: Height: 5' 2.5" (158.8 cm) Weight: 139 lb 1.6 oz (63.1 kg) IBW/kg (Calculated) : 51.25 TPN AdjBW (KG): 60.3 Body mass index is 25.04 kg/m.   Assessment:  83 yo female started on TPN Clinimix 5/15 w/o electrolytes at 40 ml/hr on 9/2.  Endo:  No hx of diabetes. BG 111-153  Insulin requirements in the past 24 hours: 6 units SSI (received dose of dexamethasone 9/2) Lytes: K 4.1, Mag 1.9, Phos 1.7 (no lytes in TPN) Renal: SCr 0.79  Best Practices: TPN Access: PICC started 9/2 TPN start date: 9/2  Nutritional Goals (per RD recommendation on 9/3): At goal, provides 1468 calories, 78 grams of protein, and 151mL total volume Goal TPN rate of 65 ml/hr  Current Nutrition: CLD started 9/3, advanced to full liquid diet 9/4  Plan:  Will order Na Phos 30 mmol IV x1  Continue Clinimix 5/15 with electrolytes at 31mL/hr with MVI and trace elements; add 20% IVLE at 37mL/hr x12 hrs.  Continue SSI 0-9 q4h   F/U TPN labs and glucose in AM  Paulina Fusi, PharmD, BCPS 06/02/2017 4:10 PM

## 2017-06-02 NOTE — Progress Notes (Signed)
Physical Therapy Treatment Patient Details Name: Crystal Landry MRN: [id number] DOB: 1927/08/07 Today's Date: 06/02/2017    History of Present Illness [name] here with a-fib/RVR and weakness. Patient has PMH significant for endometrial CA, depression, HTN, abdominal pain; CT scan showed possible blockage in esophagus. Patient underwent EGD on 9/3 for GI bleed.     PT Comments    Pt is able to ambulate much better than previous session and though her HR was elevated t/o the effort it remained steady 100-110, O2 in the high 90s on room air.  She had confident cadence, community appropriate speed and was not overly reliant on the walker (did ~25 ft w/ only light single HHA). Overall she did well and feels much better about being able to go home safely.     Follow Up Recommendations  Home health PT     Equipment Recommendations       Recommendations for Other Services       Precautions / Restrictions Precautions Precautions: Fall Restrictions Weight Bearing Restrictions: No    Mobility  Bed Mobility Overal bed mobility: Independent Bed Mobility: Supine to Sit;Sit to Supine           General bed mobility comments: Pt did not need rails get get in/out of bed, showed good confidence/strength  Transfers Overall transfer level: Independent Equipment used: Rolling walker (2 wheeled) Transfers: Sit to/from Stand Sit to Stand: Supervision;Min guard         General transfer comment: Pt was able to rise and maintain balance w/o direct UE use, showed good strength/confidence  Ambulation/Gait Ambulation/Gait assistance: Supervision Ambulation Distance (Feet): 250 Feet Assistive device: Rolling walker (2 wheeled);1 person hand held assist       General Gait Details: Pt was able to take much longer and more confident steps today and though she had some minimal fatigue was easily able to circumambulate the nurses' station and was able to walk ~25 ft w/o AD using only  light HHA in L hand (simulating baseline cane use)   Stairs            Wheelchair Mobility    Modified Rankin (Stroke Patients Only)       Balance Overall balance assessment: Needs assistance Sitting-balance support: Feet supported Sitting balance-Leahy Scale: Good       Standing balance-Leahy Scale: Good Standing balance comment: Pt needing at least single UE support, but had no LOBs/stagger steps/etc                            Cognition Arousal/Alertness: Awake/alert Behavior During Therapy: WFL for tasks assessed/performed Overall Cognitive Status: Within Functional Limits for tasks assessed                                        Exercises      General Comments        Pertinent Vitals/Pain Pain Assessment:  (baseline R knee pain)    Home Living                      Prior Function            PT Goals (current goals can now be found in the care plan section) Progress towards PT goals: Progressing toward goals    Frequency    Min 2X/week  PT Plan Discharge plan needs to be updated    Co-evaluation              AM-PAC PT "6 Clicks" Daily Activity  Outcome Measure  Difficulty turning over in bed (including adjusting bedclothes, sheets and blankets)?: None Difficulty moving from lying on back to sitting on the side of the bed? : None Difficulty sitting down on and standing up from a chair with arms (e.g., wheelchair, bedside commode, etc,.)?: None Help needed moving to and from a bed to chair (including a wheelchair)?: None Help needed walking in hospital room?: None Help needed climbing 3-5 steps with a railing? : None 6 Click Score: 24    End of Session Equipment Utilized During Treatment: Gait belt Activity Tolerance: Patient limited by fatigue;No increased pain Patient left: with family/visitor present;with bed alarm set;with restraints reapplied;with call bell/phone within reach   PT  Visit Diagnosis: Unsteadiness on feet (R26.81);Muscle weakness (generalized) (M62.81)     Time: 4235-3614 PT Time Calculation (min) (ACUTE ONLY): 16 min  Charges:  $Gait Training: 8-22 mins                    G Codes:       Kreg Shropshire, DPT 06/02/2017, 5:26 PM

## 2017-06-02 NOTE — Care Management Important Message (Signed)
Important Message  Patient Details  Name: Crystal Landry MRN: [id number] Date of Birth: 11-06-1926   Medicare Important Message Given:  Yes Signed IM notice given to patient's son.   Katrina Stack, RN 06/02/2017, 3:39 PM

## 2017-06-02 NOTE — Evaluation (Signed)
Occupational Therapy Evaluation Patient Details Name: Crystal Landry MRN: [id number] DOB: 02-06-1927 Today's Date: 06/02/2017    History of Present Illness [name] came to ED with a-fib/RVR and weakness. Patient has PMH significant for endometrial CA, depression, HTN, abdominal pain; CT scan showed possible blockage in esophagus. Patient underwent EGD on 9/3 for GI bleed.    Clinical Impression   Crystal Landry is an 83 yo female who came to ED on 8/30 with abdominal pain. She was found to have possible blockage/GI bleed and is s/p EGD for diagnosis. Patient was living alone prior to admittance and independent with all self care tasks, driving, groceries, medication mgt, and cooking/cleaning. Pt endorses 1 fall in past 12 months. Pt reports walking with her rollator for most household mobility and using SPC for most short distance mobility outside of the home (for example, going from car to inside the grocery store where she got a grocery cart). Of note, patient has past meniscal tear in RLE knee and reports that occasionally her right knee will buckle and has been told by a doctor that it could buckle at any time. Currently, pt requires supervision for bed mobility, min A for sit<>stand transfers from the regular toilet, and min guard for sit<>stand transfers from EOB, and set up/supervision for seated LB dressing and min guard for LB dressing during transitional movements and grooming in standing. Patient ambulated in room approx 6+6 feet with RW to go to/from the bathroom with min guard for safety and use of a 2WW. Following toileting task and washing hands at the sink, patient was very tired and slightly SOB. Pt presents with impaired BLE strength and activity tolerance which is currently limiting her ability to care for self safely. Patient would benefit from skilled OT intervention to improve functional mobility and self care skills, including education/training in energy conservation strategies  and falls prevention strategies in order to maximize return to PLOF and minimize risk of falls/injury/rehospitalization. Pt with very limited caregiver assistance at home. Recommend STR following hospitalization. Will continue to assess for appropriateness of [facility] services.    Follow Up Recommendations  SNF    Equipment Recommendations  None recommended by OT    Recommendations for Other Services       Precautions / Restrictions Precautions Precautions: Fall Restrictions Weight Bearing Restrictions: No      Mobility Bed Mobility Overal bed mobility: Needs Assistance Bed Mobility: Supine to Sit;Sit to Supine     Supine to sit: Supervision;HOB elevated Sit to supine: Supervision   General bed mobility comments: use of bedrails for initial sup>sit  Transfers Overall transfer level: Needs assistance Equipment used: Rolling walker (2 wheeled) Transfers: Sit to/from Stand Sit to Stand: Min assist;Min guard         General transfer comment: min guard with VC for hand placement from EOB, min assist from regular toilet    Balance Overall balance assessment: Needs assistance Sitting-balance support: Feet supported Sitting balance-Leahy Scale: Good     Standing balance support: Bilateral upper extremity supported Standing balance-Leahy Scale: Fair                             ADL either performed or assessed with clinical judgement   ADL Overall ADL's : Needs assistance/impaired Eating/Feeding: Set up;Sitting   Grooming: Min guard;Wash/dry hands;Standing   Upper Body Bathing: Sitting;Set up;Supervision/ safety   Lower Body Bathing: Sitting/lateral leans;Set up;Min guard;Sit to/from stand Lower Body Bathing  Details (indicate cue type and reason): min guard for sit to stand Upper Body Dressing : Sitting;Set up   Lower Body Dressing: Sitting/lateral leans;Sit to/from stand;Set up;Min guard Lower Body Dressing Details (indicate cue type and reason): min guard  during sit to stand, but able to don socks with set up and supervision while seated EOB with no LOB. Toilet Transfer: Minimal assistance;Regular Toilet;Grab bars;Ambulation;RW Toilet Transfer Details (indicate cue type and reason): RW for ambulation, heavy reliance on R grab bar, requiring min assist to perform Toileting- Clothing Manipulation and Hygiene: Sitting/lateral lean;Independent       Functional mobility during ADLs: Min guard;Rolling walker General ADL Comments: pt generally at min guard-supervision level for ADL tasks. Very limited activity tolerance for tasks.     Vision Baseline Vision/History: Wears glasses Wears Glasses: Reading only Patient Visual Report: No change from baseline Vision Assessment?: No apparent visual deficits     Perception     Praxis      Pertinent Vitals/Pain Pain Assessment: No/denies pain     Hand Dominance     Extremity/Trunk Assessment Upper Extremity Assessment Upper Extremity Assessment: Overall WFL for tasks assessed (overall grossly WFL for age and tasks)   Lower Extremity Assessment Lower Extremity Assessment: Generalized weakness;Defer to PT evaluation   Cervical / Trunk Assessment Cervical / Trunk Assessment: Kyphotic   Communication Communication Communication: No difficulties   Cognition Arousal/Alertness: Awake/alert Behavior During Therapy: WFL for tasks assessed/performed Overall Cognitive Status: Within Functional Limits for tasks assessed                                     General Comments       Exercises     Shoulder Instructions      Home Living Family/patient expects to be discharged to:: Private residence Living Arrangements: Alone Available Help at Discharge: Family;Available PRN/intermittently Type of Home: House Home Access: Stairs to enter [employer] of Steps: 1 (pt reports a 1/2 step) Entrance Stairs-Rails: None (pt can use the doorframe to hold on to) Home Layout:  Able to live on main level with bedroom/bathroom;Other (Comment) (has basement but doesn't use)     Bathroom Shower/Tub: Teacher, early years/pre: Standard     Home Equipment: Environmental consultant - 2 wheels;Cane - single point;Shower seat;Grab bars - tub/shower;Grab bars - toilet          Prior Functioning/Environment Level of Independence: Independent with assistive device(s)        Comments: Pt reports using rollator for most household mobility, used cane for most ambulation outside, does endorse sometimes using no AD for short distances; would cook/do light housework, manages medications, and drives herself; Patient reports being mod I for self care bathing/dressing. 1 fall in past 12 months (slipped in the grass)        OT Problem List: Decreased strength;Decreased activity tolerance;Decreased safety awareness;Impaired balance (sitting and/or standing);Decreased knowledge of use of DME or AE      OT Treatment/Interventions: Self-care/ADL training;Therapeutic exercise;Therapeutic activities;Energy conservation;DME and/or AE instruction;Patient/family education    OT Goals(Current goals can be found in the care plan section) Acute Rehab OT Goals Patient Stated Goal: Get my energy back. OT Goal Formulation: With patient/family Time For Goal Achievement: 06/16/17 Potential to Achieve Goals: Good  OT Frequency: Min 1X/week   Barriers to D/C: Decreased caregiver support          Co-evaluation  AM-PAC PT "6 Clicks" Daily Activity     Outcome Measure Help from another person eating meals?: None Help from another person taking care of personal grooming?: A Little Help from another person toileting, which includes using toliet, bedpan, or urinal?: A Little Help from another person bathing (including washing, rinsing, drying)?: A Little Help from another person to put on and taking off regular upper body clothing?: None Help from another person to put on and  taking off regular lower body clothing?: A Little 6 Click Score: 20   End of Session Equipment Utilized During Treatment: Gait belt;Rolling walker Nurse Communication: Other (comment) (RN notified to come hook TPN back up )  Activity Tolerance: Patient limited by fatigue Patient left: in bed;with call bell/phone within reach;with bed alarm set;with family/visitor present  OT Visit Diagnosis: Other abnormalities of gait and mobility (R26.89);Muscle weakness (generalized) (M62.81)                Time: 5993-5701 OT Time Calculation (min): 37 min Charges:  OT General Charges $OT Visit: 1 Visit OT Evaluation $OT Eval Low Complexity: 1 Low OT Treatments $Self Care/Home Management : 8-22 mins G-Codes: OT G-codes **NOT FOR INPATIENT CLASS** Functional Assessment Tool Used: AM-PAC 6 Clicks Daily Activity;Clinical judgement Functional Limitation: Self care Self Care Current Status (X7939): At least 20 percent but less than 40 percent impaired, limited or restricted Self Care Goal Status (Q3009): At least 1 percent but less than 20 percent impaired, limited or restricted   Jeni Salles, MPH, MS, OTR/L ascom [phone] 06/02/17, 11:52 AM

## 2017-06-02 NOTE — Progress Notes (Signed)
Patient has voided 15ml post foley catheter removal

## 2017-06-02 NOTE — Care Management (Signed)
Provided patient's son with list of home health agencies and of private duty agencies in the even patient does discharge home and requires skilled home health services and in home continuous care.

## 2017-06-02 NOTE — Progress Notes (Signed)
Nutrition Follow-Up Note  DOCUMENTATION CODES:   Not applicable  INTERVENTION:  Recommend continue Clinimix 5/15 with electrolytes at 77mL/hr Add 20% IVLE at 85mL/hr x12 hrs  Monitor for diet advancement to pureed, recommend d/c TPN once diet is advanced.  Currently provides 1042 calories, 48 grams of protein, 944mL total volume At goal, provides 1468 calories, 78 grams of protein, and 151mL total volume  Recommend Replete PO4 (1.7)  NUTRITION DIAGNOSIS:   Inadequate oral intake related to poor appetite, nausea, vomiting as evidenced by per patient/family report. -ongoing  GOAL:   Patient will meet greater than or equal to 90% of their needs -progressing  MONITOR:   PO intake, I & O's, Labs, Diet advancement, Weight trends, Skin  REASON FOR ASSESSMENT:   ASSESSMENT:   83 yo female with PMHHx HTN, HLD, Endometrial cancer duodenal obstruction, AKI, L-sided hydronephrosis and hydroureter, EGD done yesterday found to have a severely edematous distal stomach with a functional obstruction. EUS and MRCP being considered. Advanced to clears today. TPN started 05/31/2017  Patient advanced to full liquids today. Tolerating, no complaints, meal completion 100% Awaiting results of biopsy.  Access: R PICC  Intake/Output Summary (Last 24 hours) at 06/02/17 1456 Last data filed at 06/02/17 1350  Gross per 24 hour  Intake          1550.36 ml  Output             3000 ml  Net         -1449.64 ml  2.2L Fluid Negative  Labs reviewed:  CBGs 109, 111, 111 Phos 1.7  Medications reviewed and include:  Sodium Phos Novolog 0-9 Units every 4 hours  Diet Order:  TPN (CLINIMIX) Adult without lytes DIET - DYS 1 Room service appropriate? Yes; Fluid consistency: Thin TPN (CLINIMIX) Adult without lytes  Skin:  Reviewed, no issues  Last BM:  05/31/2017  Height:   Ht Readings from Last 1 Encounters:  05/28/17 5' 2.5" (1.588 m)    Weight:   Wt Readings from Last 1 Encounters:   06/02/17 139 lb 1.6 oz (63.1 kg)    Ideal Body Weight:  51.13 kg  BMI:  Body mass index is 25.04 kg/m.  Estimated Nutritional Needs:   Kcal:  1400-1600 calories  Protein:  74-93 grams (1.2-1.5g/kg)  Fluid:  > 1.5L  EDUCATION NEEDS:   No education needs identified at this time  Satira Anis. Vearl Allbaugh, MS, RD LDN Inpatient Clinical Dietitian Pager [phone]

## 2017-06-02 NOTE — Progress Notes (Signed)
Patient ID: Crystal Landry, female   DOB: 1927/09/02, 83 y.o.   MRN: [id number]   [place] PROGRESS NOTE  ZEIDY TAYAG GDJ:[id number] DOB: Dec 17, 1926 DOA: 05/28/2017 PCP: [name], MD  HPI/Subjective: Patient feels well.  Offers no complaints. Feels okay.  Objective: Vitals:   06/02/17 0719 06/02/17 1238  BP: (!) 116/57 124/62  Pulse: (!) 36 88  Resp: 16 16  Temp: (!) 97.5 F (36.4 C) 98.2 F (36.8 C)  SpO2: 95% 99%    Filed Weights   05/31/17 1426 06/01/17 0457 06/02/17 0314  Weight: 59.4 kg (130 lb 14.4 oz) 61.7 kg (136 lb 1.6 oz) 63.1 kg (139 lb 1.6 oz)    ROS: Review of Systems  Constitutional: Negative for chills and fever.  Eyes: Negative for blurred vision.  Respiratory: Negative for cough and shortness of breath.   Cardiovascular: Negative for chest pain.  Gastrointestinal: Negative for abdominal pain, constipation, diarrhea, nausea and vomiting.  Genitourinary: Negative for dysuria.  Musculoskeletal: Negative for joint pain.  Neurological: Negative for dizziness and headaches.   Exam: Physical Exam  Constitutional: She is oriented to person, place, and time.  HENT:  Nose: No mucosal edema.  Mouth/Throat: No oropharyngeal exudate or posterior oropharyngeal edema.  Eyes: Pupils are equal, round, and reactive to light. Conjunctivae, EOM and lids are normal.  Neck: No JVD present. Carotid bruit is not present. No edema present. No thyroid mass and no thyromegaly present.  Cardiovascular: S1 normal and S2 normal.  Exam reveals no gallop.   Murmur heard.  Systolic murmur is present with a grade of 4/6  Pulses:      Dorsalis pedis pulses are 2+ on the right side, and 2+ on the left side.  Metallic heart valve  Respiratory: No respiratory distress. She has no wheezes. She has no rhonchi. She has no rales.  GI: Soft. Bowel sounds are normal. There is no tenderness.  Musculoskeletal:       Right shoulder: She exhibits no swelling.   Lymphadenopathy:    She has no cervical adenopathy.  Neurological: She is alert and oriented to person, place, and time. No cranial nerve deficit.  Skin: Skin is warm. No rash noted. Nails show no clubbing.  Psychiatric: She has a normal mood and affect.      Data Reviewed: Basic Metabolic Panel:  Recent Labs Lab 05/29/17 0416 05/30/17 0518 05/31/17 0457 06/01/17 0512 06/01/17 2356  NA 139 144 143 140 138  K 3.6 4.4 4.5 5.1 4.1  CL 102 111 111 110 112*  CO2 27 26 22 24 22   GLUCOSE 94 67 64* 195* 141*  BUN 20 22* 21* 26* 23*  CREATININE 1.39* 0.93 0.87 0.86 0.79  CALCIUM 8.6* 8.2* 8.2* 8.3* 8.0*  MG  --  2.1 2.0 2.1 1.9  PHOS  --   --  2.7 2.4* 1.7*   Liver Function Tests:  Recent Labs Lab 05/28/17 1212 05/29/17 0416 06/01/17 0512  AST 33 22 22  ALT 17 14 13*  ALKPHOS 75 61 55  BILITOT 1.1 1.1 0.6  PROT 7.6 6.2* 5.8*  ALBUMIN 4.5 3.7 3.1*    Recent Labs Lab 05/28/17 1212  LIPASE 36   CBC:  Recent Labs Lab 05/29/17 0416 05/31/17 0457 06/01/17 0512 06/01/17 2356 06/02/17 1116  WBC 9.3 8.4 7.8 12.4* 9.5  NEUTROABS  --   --  7.2*  --   --   HGB 12.6 11.4* 11.3* 10.8* 11.2*  HCT 37.8 33.9* 33.2* 31.9* 33.9*  MCV 97.7 98.2 96.5 96.7 98.9  PLT 188 172 162 172 176      Studies: Dg Chest Port 1 View  Result Date: 05/31/2017 CLINICAL DATA:  PICC line placement EXAM: PORTABLE CHEST 1 VIEW COMPARISON:  05/28/2017 FINDINGS: RIGHT PICC line tip in distal SVC. Normal cardiac silhouette. No pulmonary edema. No pneumothorax IMPRESSION: RIGHT PICC line in good position Electronically Signed   By: Suzy Bouchard M.D.   On: 05/31/2017 18:45    Scheduled Meds: . diltiazem  300 mg Oral Daily  . insulin aspart  0-9 Units Subcutaneous Q4H  . pantoprazole (PROTONIX) IV  40 mg Intravenous Q12H  . Warfarin - Pharmacist Dosing Inpatient   Does not apply q1800   Continuous Infusions: . TPN (CLINIMIX) Adult without lytes 40 mL/hr at 06/01/17 1806     Assessment/Plan:  1. Functional Bowel obstruction. Severe inflammation and esophagus stomach and duodenum. Advance to full liquid diet today, potentially pured diet later on today. GI recommended TPN while here. Awaiting biopsy results for further recommendations. 2. Atrial fibrillation with episodes of bradycardia. Decrease dose of Cardizem CD 240 mg for tomorrow. May also be low because the patient received some IV digoxin yesterday. Coumadin level therapeutic. Heparin drip stopped 3. Acute kidney injury. Improved with IV fluid hydration 4. Left-sided hydronephrosis and hydroureter. Urology on call on admission recommended outpatient follow-up. Discontinue Foley catheter 5. Hyperlipidemia unspecified. Hold on statin for now 6. Metallic heart valve. Coumadin will be held tonight with INR now in the normal range. Likely will have a higher INR tomorrow with a double dose of Coumadin the other night. Heparin drip stopped. 7. Family concerned about double dosing of Coumadin the other night. Case discussed with nurse in charge today to speak with the family about these issues.  Code Status:     Code Status Orders        Start     Ordered   05/28/17 1732  Full code  Continuous     05/28/17 1732    Code Status History    Date Active Date Inactive Code Status Order ID Comments User Context   This patient has a current code status but no historical code status.    Advance Directive Documentation     Most Recent Value  Type of Advance Directive  Healthcare Power of Attorney  Pre-existing out of facility DNR order (yellow form or pink MOST form)  -  "MOST" Form in Place?  -     Family Communication: Son at the bedside Disposition Plan: to be determined  Consultants:  Gastroenterology  Surgery  Cardiology  Time spent: 25 minutes  Loletha Grayer  [employer]

## 2017-06-02 NOTE — Progress Notes (Signed)
Heparin drip discontinued

## 2017-06-02 NOTE — Progress Notes (Signed)
[place] for Warfarin Indication: atrial fibrillation  Allergies  Allergen Reactions  . Codeine Sulfate     Other reaction(s): Unknown  . Diphenhydramine Other (See Comments)    skin became red all over  . Ivp Dye [Iodinated Diagnostic Agents] Other (See Comments)    Skin becomes red all over  . Quinidine     Other reaction(s): Unknown Other reaction(s): Unknown    Patient Measurements: Height: 5' 2.5" (158.8 cm) Weight: 139 lb 1.6 oz (63.1 kg) IBW/kg (Calculated) : 51.25  Heparin DW: 60 kg  Vital Signs: Temp: 98.2 F (36.8 C) (09/04 1238) Temp Source: Oral (09/04 1238) BP: 124/62 (09/04 1238) Pulse Rate: 88 (09/04 1238)  Labs:  Recent Labs  05/31/17 0457  06/01/17 0512 06/01/17 1602 06/01/17 2356 06/02/17 1116  HGB 11.4*  --  11.3*  --  10.8* 11.2*  HCT 33.9*  --  33.2*  --  31.9* 33.9*  PLT 172  --  162  --  172 176  LABPROT 17.7*  --  17.4*  --  29.9*  --   INR 1.47  --  1.44  --  2.88  --   HEPARINUNFRC  --   < > 0.73* 0.50 0.82*  --   CREATININE 0.87  --  0.86  --  0.79  --   < > = values in this interval not displayed.  Estimated Creatinine Clearance: 43.8 mL/min (by C-G formula based on SCr of 0.79 mg/dL).   Medical History: Past Medical History:  Diagnosis Date  . A-fib ([place])   . Anxiety   . Atrial fibrillation ([place])   . Cancer ([facility])    endometrial  . Depression   . Hypertension    Assessment: 83 y/o F with a known h/o atrial fibrillation and mechnical mitral valve on warfarin 4 mg daily PTA admitted with abdominal pain as well as intractable N/V. Pt warfarin was held and was given vit K to reduce INR for endoscopy.  Date INR 8/30 2.62 - held warfarin 8/31 3.1 - hold warfarin 9/1 3.96 - hold warfarin, Vit K 2.5mg  IV given bc pt needed procedure --> INR 1.51 9/2       1.47 - warfarin 6 mg 9/3 1.44 - held dose as patient may have received a 2nd dose              on 9/2 9/4        2.88  Goal of  Therapy:  INR 2.5-3.5  Plan:  Since INR increased drastically will hold tonight's dose. INR could be higher tomorrow. Will follow daily INR levels.  Pharmacy will continue to follow.   Paulina Fusi, PharmD, BCPS 06/02/2017 4:03 PM

## 2017-06-02 NOTE — Progress Notes (Signed)
Foley catheter removed intact,will monitor for voiding

## 2017-06-02 NOTE — Care Management (Signed)
Spoke with patient and her youngest son regarding physical therapy recommendation of skilled nursing placement when medically stable.  Patient is not keen on this recommendation.  Patient says that she is very weak from illness and has no one to stay with her at discharge because her sons all live out of town and work.  Patient's son does not add any comments to the conversation pro or con snf or plan for home.  Diet is being advanced to full liquid today.  Remains on TPN.  Heparin drip discontinued

## 2017-06-02 NOTE — Progress Notes (Signed)
Tolerating full liquid diet,denies pain,TPN continues,walked with PT

## 2017-06-02 NOTE — Clinical Social Work Note (Signed)
CSW received referral that PT is recommending SNF.  Patient and family did not seem interested in SNF placement, CSW following in case patient changes her mind.  Case management has met with patient and her family to inform them what home health agencies are available.  Eric R. Anterhaus, MSW, LCSWA [phone]  06/02/2017 5:18 PM  

## 2017-06-03 ENCOUNTER — Encounter: Payer: Self-pay | Admitting: Gastroenterology

## 2017-06-03 LAB — BASIC METABOLIC PANEL
ANION GAP: 6 (ref 5–15)
BUN: 17 mg/dL (ref 6–20)
CO2: 25 mmol/L (ref 22–32)
Calcium: 8.3 mg/dL — ABNORMAL LOW (ref 8.9–10.3)
Chloride: 111 mmol/L (ref 101–111)
Creatinine, Ser: 0.74 mg/dL (ref 0.44–1.00)
Glucose, Bld: 100 mg/dL — ABNORMAL HIGH (ref 65–99)
POTASSIUM: 3.9 mmol/L (ref 3.5–5.1)
SODIUM: 142 mmol/L (ref 135–145)

## 2017-06-03 LAB — GLUCOSE, CAPILLARY
GLUCOSE-CAPILLARY: 98 mg/dL (ref 65–99)
GLUCOSE-CAPILLARY: 108 mg/dL — AB (ref 65–99)
GLUCOSE-CAPILLARY: 128 mg/dL — AB (ref 65–99)
Glucose-Capillary: 100 mg/dL — ABNORMAL HIGH (ref 65–99)
Glucose-Capillary: 125 mg/dL — ABNORMAL HIGH (ref 65–99)
Glucose-Capillary: 95 mg/dL (ref 65–99)

## 2017-06-03 LAB — PROTIME-INR
INR: 1.83
INR: 1.65
PROTHROMBIN TIME: 19.4 s — AB (ref 11.4–15.2)
Prothrombin Time: 21 seconds — ABNORMAL HIGH (ref 11.4–15.2)

## 2017-06-03 LAB — HEPARIN LEVEL (UNFRACTIONATED): HEPARIN UNFRACTIONATED: 0.3 [IU]/mL (ref 0.30–0.70)

## 2017-06-03 LAB — PHOSPHORUS: PHOSPHORUS: 3.1 mg/dL (ref 2.5–4.6)

## 2017-06-03 MED ORDER — HEPARIN BOLUS VIA INFUSION
3000.0000 [IU] | Freq: Once | INTRAVENOUS | Status: AC
  Administered 2017-06-03: 3000 [IU] via INTRAVENOUS
  Filled 2017-06-03: qty 3000

## 2017-06-03 MED ORDER — SIMVASTATIN 20 MG PO TABS
20.0000 mg | ORAL_TABLET | Freq: Every day | ORAL | Status: DC
  Administered 2017-06-03 – 2017-06-10 (×8): 20 mg via ORAL
  Filled 2017-06-03 (×8): qty 1

## 2017-06-03 MED ORDER — TRAZODONE HCL 50 MG PO TABS
25.0000 mg | ORAL_TABLET | Freq: Once | ORAL | Status: DC
  Filled 2017-06-03: qty 1

## 2017-06-03 MED ORDER — ENSURE ENLIVE PO LIQD
237.0000 mL | Freq: Two times a day (BID) | ORAL | Status: DC
  Administered 2017-06-03 – 2017-06-09 (×10): 237 mL via ORAL

## 2017-06-03 MED ORDER — WARFARIN SODIUM 6 MG PO TABS
6.0000 mg | ORAL_TABLET | ORAL | Status: AC
  Administered 2017-06-03: 6 mg via ORAL
  Filled 2017-06-03: qty 1

## 2017-06-03 MED ORDER — OXYBUTYNIN CHLORIDE ER 5 MG PO TB24
5.0000 mg | ORAL_TABLET | Freq: Every day | ORAL | Status: DC
  Administered 2017-06-03 – 2017-06-11 (×9): 5 mg via ORAL
  Filled 2017-06-03 (×9): qty 1

## 2017-06-03 MED ORDER — HEPARIN (PORCINE) IN NACL 100-0.45 UNIT/ML-% IJ SOLN
750.0000 [IU]/h | INTRAMUSCULAR | Status: DC
  Administered 2017-06-03: 600 [IU]/h via INTRAVENOUS
  Administered 2017-06-04: 750 [IU]/h via INTRAVENOUS
  Filled 2017-06-03 (×3): qty 250

## 2017-06-03 NOTE — Clinical Social Work Note (Signed)
Clinical Social Work Assessment  Patient Details  Name: Crystal Landry MRN: [id number] Date of Birth: 19-Sep-1927  Date of referral:  06/03/17               Reason for consult:  Facility Placement                Permission sought to share information with:  Facility Sport and exercise psychologist, Family Supports Permission granted to share information::  Yes, Verbal Permission Granted  Name::     Crystal Landry, Crystal Landry   [phone]   Agency::  Snf admissions  Relationship::     Contact Information:     Housing/Transportation Living arrangements for the past 2 months:  Single Family Home Source of Information:  Patient, Adult Children Patient Interpreter Needed:  None Criminal Activity/Legal Involvement Pertinent to Current Situation/Hospitalization:  No - Comment as needed Significant Relationships:  Adult Children Lives with:  Self Do you feel safe going back to the place where you live?  No Need for family participation in patient care:  Yes (Comment)  Care giving concerns:  Patient's family feels like she should go to SNF for short term rehab, patient is not sure if she wants to go.   Social Worker assessment / plan:  Patient is an 83 year old female who lives alone.  Patient's son is at bedside, patient lives alone, and has not been to rehab before.  CSW explained what the process is, how insurance will pay for stay, what the approval process is like and also what is involved in the bed search.  Patient's son stated that patient has had some recent falls, which is part of the reason he would like SNF for short term rehab.  Patient is reluctant to go to SNF but she has agreed to go if she is approved by [facility].  Patient and son did not have any other questions or concerns.  Patient and son gave CSW permission to begin bed search in [place].     Employment status:  Retired Nurse, adult PT Recommendations:  [place], Home with  [place], [place] / Referral to community resources:  [place]  Patient/Family's Response to care:  Patient and family are agreeable to going to SNF for short term rehab if approved by [facility].  Patient/Family's Understanding of and Emotional Response to Diagnosis, Current Treatment, and Prognosis:  Patient and family are hopeful that she will not have to be in SNF for very long.  Emotional Assessment Appearance:  Appears stated age Attitude/Demeanor/Rapport:    Affect (typically observed):  Appropriate, Stable Orientation:  Oriented to Self, Oriented to Place, Oriented to  Time, Oriented to Situation Alcohol / Substance use:  Not Applicable Psych involvement (Current and /or in the community):  No (Comment)  Discharge Needs  Concerns to be addressed:  Lack of Support Readmission within the last 30 days:  No Current discharge risk:  Lack of support system Barriers to Discharge:  Continued Medical Work up, [employer]   Anell Barr 06/03/2017, 10:46 PM

## 2017-06-03 NOTE — Progress Notes (Signed)
OT Cancellation Note  Patient Details Name: Crystal Landry MRN: [id number] DOB: 09/20/1927   Cancelled Treatment:    Reason Eval/Treat Not Completed: Other (comment). Attempted twice this afternoon. Both times pt with NSG. Will re-attempt next date.  Jeni Salles, MPH, MS, OTR/L ascom [phone] 06/03/17, 3:16 PM

## 2017-06-03 NOTE — Clinical Social Work Note (Signed)
CSW met with patient and her son Richardson Landry, they gave CSW permission to begin bed search in [facility] for SNF placement.  CSW explained to them that insurance may not approve SNF placement.  Patient and her son expressed understanding, CSW explained if insurance denies patient she will have to go home with home health or pay privately.  Patient's son expressed understanding, CSW left information about different SNFs available and contact information for CSW, formal assessment to follow.  Patient and her son were very appreciative of information given regarding SNFs.  Jones Broom. Wheeler, MSW, [place]  06/03/2017 3:28 PM

## 2017-06-03 NOTE — Care Management (Signed)
CM spoke with patient and her son Crystal Landry regarding discharge disposition then spoke with Crystal Landry out of presence of patient.  Physical therapy stated patient had improved in functional status from initial visit and she feels better about being able to go home safely.  Patient's son Crystal Landry does not feel patient can be safe at home at present time due to debility from her present illness.  She lives alone and has no in home support. They have recently found out that patient has had some falls in the home.  There are concerns that patient is not able to perform her adls.  They are concerned of the need for closed ongoing daily monitoring by a nurse due to  the serious  illness from which she is recovering. .  Both sons live out of town and work.  CM discussed with Crystal Landry that insurance company would have to approve a skilled nursing stay.  Also discussed that patient is competent and no one can force her to go to skilled nursing if she chooses not to- that if returns home maybe she will agree to have in home round the clock care.  Crystal Landry verbalized understanding.  Reminded him the list of agencies have been given and he acknowledges.  CSW aware

## 2017-06-03 NOTE — Plan of Care (Signed)
Problem: Nutrition: Goal: Adequate nutrition will be maintained Outcome: Progressing TPN dicontinued. Pt die progressed to purree at dinner time. Pt also reports drinking 2 ensures today.

## 2017-06-03 NOTE — Progress Notes (Signed)
[place] for Warfarin and heparin Drip  Indication: atrial fibrillation  Allergies  Allergen Reactions  . Codeine Sulfate     Other reaction(s): Unknown  . Diphenhydramine Other (See Comments)    skin became red all over  . Ivp Dye [Iodinated Diagnostic Agents] Other (See Comments)    Skin becomes red all over  . Quinidine     Other reaction(s): Unknown Other reaction(s): Unknown    Patient Measurements: Height: 5' 2.5" (158.8 cm) Weight: 136 lb 11.2 oz (62 kg) IBW/kg (Calculated) : 51.25  Heparin DW: 60 kg  Vital Signs: Temp: 98 F (36.7 C) (09/05 1916) Temp Source: Oral (09/05 1916) BP: 114/62 (09/05 1916) Pulse Rate: 91 (09/05 1916)  Labs:  Recent Labs  06/01/17 0512 06/01/17 1602 06/01/17 2356 06/02/17 1116 06/03/17 0454 06/03/17 0800 06/03/17 1929  HGB 11.3*  --  10.8* 11.2*  --   --   --   HCT 33.2*  --  31.9* 33.9*  --   --   --   PLT 162  --  172 176  --   --   --   LABPROT 17.4*  --  29.9*  --  21.0* 19.4*  --   INR 1.44  --  2.88  --  1.83 1.65  --   HEPARINUNFRC 0.73* 0.50 0.82*  --   --   --  0.30  CREATININE 0.86  --  0.79  --  0.74  --   --     Estimated Creatinine Clearance: 43.5 mL/min (by C-G formula based on SCr of 0.74 mg/dL).   Medical History: Past Medical History:  Diagnosis Date  . A-fib ([place])   . Anxiety   . Atrial fibrillation ([place])   . Cancer (HCC)    endometrial  . Depression   . Hypertension    Assessment: 83 y/o F with a known h/o atrial fibrillation and mechnical mitral valve on warfarin 4 mg daily PTA admitted with abdominal pain as well as intractable N/V. Pt warfarin was held and was given vit K to reduce INR for endoscopy.  Date INR Dose  8/30 2.62 HELD 8/31 3.1   HELD 9/1 3.96   HELD- Vit K 2.5mg  IV given bc pt needed procedure --> INR 1.51 9/2       1.47  6mg  9/3 1.44 HELD- patient may have received a 2nd dose on 9/2 9/4       2.88 HELD 9/5 1.83 6mg     Goal of  Therapy:  INR 2.5-3.5  Plan 1 Warfarin:  INR subtherapeutic this morning. Will order warfarin 6mg  x 1 dose none.   Will follow daily INR levels and order doses based on trend.   Plan 2 Heparin: INR subtherapeutic- Heparin drip reordered until INR therapeutic and stable Will start with Heparin 3000u Bolus Start infusion of Heparin 600u/hr Check Anti-Xa level in 8 hours.   9/5: HL@1929 = 0.30.  Will slightly increase Heparin drip to 650 units/hr. Will f/u next Heparin level in 8 hours.   Noralee Space, PharmD, BCPS Clinical Pharmacist 06/03/2017 10:26 PM

## 2017-06-03 NOTE — Progress Notes (Addendum)
[place] for Warfarin and heparin Drip  Indication: atrial fibrillation  Allergies  Allergen Reactions  . Codeine Sulfate     Other reaction(s): Unknown  . Diphenhydramine Other (See Comments)    skin became red all over  . Ivp Dye [Iodinated Diagnostic Agents] Other (See Comments)    Skin becomes red all over  . Quinidine     Other reaction(s): Unknown Other reaction(s): Unknown    Patient Measurements: Height: 5' 2.5" (158.8 cm) Weight: 136 lb 11.2 oz (62 kg) IBW/kg (Calculated) : 51.25  Heparin DW: 60 kg  Vital Signs: Temp: 97.7 F (36.5 C) (09/05 0342) Temp Source: Oral (09/05 0342) BP: 113/56 (09/05 0342) Pulse Rate: 82 (09/05 0342)  Labs:  Recent Labs  06/01/17 0512 06/01/17 1602 06/01/17 2356 06/02/17 1116 06/03/17 0454  HGB 11.3*  --  10.8* 11.2*  --   HCT 33.2*  --  31.9* 33.9*  --   PLT 162  --  172 176  --   LABPROT 17.4*  --  29.9*  --  21.0*  INR 1.44  --  2.88  --  1.83  HEPARINUNFRC 0.73* 0.50 0.82*  --   --   CREATININE 0.86  --  0.79  --  0.74    Estimated Creatinine Clearance: 43.5 mL/min (by C-G formula based on SCr of 0.74 mg/dL).   Medical History: Past Medical History:  Diagnosis Date  . A-fib ([place])   . Anxiety   . Atrial fibrillation ([place])   . Cancer (HCC)    endometrial  . Depression   . Hypertension    Assessment: 83 y/o F with a known h/o atrial fibrillation and mechnical mitral valve on warfarin 4 mg daily PTA admitted with abdominal pain as well as intractable N/V. Pt warfarin was held and was given vit K to reduce INR for endoscopy.  Date INR Dose  8/30 2.62 HELD 8/31 3.1   HELD 9/1 3.96   HELD- Vit K 2.5mg  IV given bc pt needed procedure --> INR 1.51 9/2       1.47  6mg  9/3 1.44 HELD- patient may have received a 2nd dose on 9/2 9/4       2.88 HELD 9/5 1.83 6mg     Goal of Therapy:  INR 2.5-3.5  Plan 1 Warfarin:  INR subtherapeutic this morning. Will order warfarin 6mg  x 1  dose none.   Will follow daily INR levels and order doses based on trend.   Plan 2 Heparin: INR subtherapeutic- Heparin drip reordered until INR therapeutic and stable Will start with Heparin 3000u Bolus Start infusion of Heparin 600u/hr Check Anti-Xa level in 8 hours.    Pernell Dupre, PharmD, BCPS Clinical Pharmacist 06/03/2017 7:33 AM

## 2017-06-03 NOTE — Progress Notes (Signed)
Nutrition Follow-Up Note  DOCUMENTATION CODES:   Not applicable  INTERVENTION:   Recommend discontinue TPN as pt is eating 50-100% Dysphagia 1 diet  Add Ensure Enlive po BID, each supplement provides 350 kcal and 20 grams of protein  Add Magic cup TID with meals, each supplement provides 290 kcal and 9 grams of protein  NUTRITION DIAGNOSIS:   Inadequate oral intake related to poor appetite, nausea, vomiting as evidenced by per patient/family report. -improving   GOAL:   Patient will meet greater than or equal to 90% of their needs -progressing  MONITOR:   PO intake, Supplement acceptance, Labs, Weight trends, I & O's  ASSESSMENT:   83 yo female with PMHHx HTN, HLD, Endometrial cancer duodenal obstruction, AKI, L-sided hydronephrosis and hydroureter. Pt s/p EGD and found to have a severely edematous distal stomach with a functional obstruction. EUS and MRCP being considered.    Spoke with pt's two sons today. They report that pt's appetite and oral intake is improving. They describe their mother as "a good old country woman" who prefers home prepared meals and is not real excited about having to eat a pureed diet. It sounds like the biggest problem pt has now is that she hates the hospital food. Pt does drink Ensure at home; RD will order. Pt documented to be eating 50-100% of meals. Per chart, pt is weight stable. Recommend discontinue TPN today; spoke to pharmacy will recheck Phosphorus. Pt's biopsies still pending.   Family asking about advice on how to puree foods as pt needs to consume foods that are easily digestible. RD provided pt's sons with handouts on how to decrease the workload on the gut by chewing foods well, cooking foods until soft enough where they can be mashed with a fork, including gravies with foods to moisten them, eating small/frequent meals, drinking plenty of liquids with meals, avoiding excess/processed sugars, avoid caffeinated and spicey foods, avoid  foods high in saturated fat and fiber, and including protein supplements (Ensure) to help pt meet estimated needs. RD will talk with pt prior to discharge to make sure she understands her diet as pt lives alone at home and will be preparing her own meals.      Medications reviewed and include: heparin, insulin, protonix, warfarin  Labs reviewed: K 3.9 wnl, Ca 8.3(L) P 1.7(L), Mg 1.9 wnl- 9/3 Prealbumin 16.7(L)- 9/3 Triglycerides 79- 9/3 cbgs- 195, 141, 100 x 48hrs  Diet Order:  .TPN (CLINIMIX-E) Adult DIET - DYS 1 Room service appropriate? Yes; Fluid consistency: Thin  Skin:  Reviewed, no issues  Last BM:  05/31/2017  Height:   Ht Readings from Last 1 Encounters:  05/28/17 5' 2.5" (1.588 m)    Weight:   Wt Readings from Last 1 Encounters:  06/03/17 136 lb 11.2 oz (62 kg)    Ideal Body Weight:  51.13 kg  BMI:  Body mass index is 24.6 kg/m.  Estimated Nutritional Needs:   Kcal:  1400-1600 calories  Protein:  74-93 grams (1.2-1.5g/kg)  Fluid:  > 1.5L  EDUCATION NEEDS:   No education needs identified at this time  Koleen Distance MS, RD, [place] Pager #- [phone] After Hours Pager: [phone]

## 2017-06-03 NOTE — Progress Notes (Signed)
PHARMACY - ADULT TOTAL PARENTERAL NUTRITION CONSULT NOTE   Pharmacy Consult for TPN Indication: bowel obstruction  Patient Measurements: Height: 5' 2.5" (158.8 cm) Weight: 136 lb 11.2 oz (62 kg) IBW/kg (Calculated) : 51.25 TPN AdjBW (KG): 60.3 Body mass index is 24.6 kg/m.   Assessment:  83 yo female started on TPN Clinimix 5/15 w/o electrolytes at 40 ml/hr on 9/2.  Endo:  No hx of diabetes. BG 111-153  Insulin requirements in the past 24 hours: 6 units SSI (received dose of dexamethasone 9/2) Lytes: K 3.9, Mag 1.9, Phos 3.1  Renal: SCr 0.79  Best Practices: TPN Access: PICC started 9/2 TPN start date: 9/2  Nutritional Goals (per RD recommendation on 9/3): At goal, provides 1468 calories, 78 grams of protein, and 1574mL total volume Goal TPN rate of 65 ml/hr  Current Nutrition: CLD started 9/3, advanced to full liquid diet 9/4  Plan:  No electrolyte replacement needed.  Discontinue Clinimix 5/15 with electrolytes at 84mL/hr  20% IVLE at 86mL/hr today   Continue SSI 0-9 q4h   F/U Labs tomorrow AM.   Pernell Dupre, PharmD, BCPS Clinical Pharmacist 06/03/2017 11:54 AM

## 2017-06-03 NOTE — Progress Notes (Signed)
Patient ID: Crystal Landry, female   DOB: [birth year], 83 y.o.   MRN: [id number]    [place] PROGRESS NOTE  ZAKIA SAINATO ERX:[id number] DOB: 1927/07/26 DOA: 05/28/2017 PCP: [name], MD  HPI/Subjective: Patient feels well.  Offers no complaints. Feels okay.  Objective: Vitals:   06/03/17 0732 06/03/17 1104  BP: 137/70 131/76  Pulse: 78 98  Resp: 18   Temp: 98 F (36.7 C) 97.9 F (36.6 C)  SpO2: 95% 95%    Filed Weights   06/01/17 0457 06/02/17 0314 06/03/17 0342  Weight: 61.7 kg (136 lb 1.6 oz) 63.1 kg (139 lb 1.6 oz) 62 kg (136 lb 11.2 oz)    ROS: Review of Systems  Constitutional: Negative for chills and fever.  Eyes: Negative for blurred vision.  Respiratory: Negative for cough and shortness of breath.   Cardiovascular: Negative for chest pain.  Gastrointestinal: Positive for constipation. Negative for abdominal pain, diarrhea, nausea and vomiting.  Genitourinary: Negative for dysuria.  Musculoskeletal: Negative for joint pain.  Neurological: Negative for dizziness and headaches.   Exam: Physical Exam  Constitutional: She is oriented to person, place, and time.  HENT:  Nose: No mucosal edema.  Mouth/Throat: No oropharyngeal exudate or posterior oropharyngeal edema.  Eyes: Pupils are equal, round, and reactive to light. Conjunctivae, EOM and lids are normal.  Neck: No JVD present. Carotid bruit is not present. No edema present. No thyroid mass and no thyromegaly present.  Cardiovascular: S1 normal and S2 normal.  Exam reveals no gallop.   Murmur heard.  Systolic murmur is present with a grade of 4/6  Pulses:      Dorsalis pedis pulses are 2+ on the right side, and 2+ on the left side.  Metallic heart valve  Respiratory: No respiratory distress. She has no wheezes. She has no rhonchi. She has no rales.  GI: Soft. Bowel sounds are normal. There is no tenderness.  Musculoskeletal:       Right shoulder: She exhibits no swelling.   Lymphadenopathy:    She has no cervical adenopathy.  Neurological: She is alert and oriented to person, place, and time. No cranial nerve deficit.  Skin: Skin is warm. No rash noted. Nails show no clubbing.  Psychiatric: She has a normal mood and affect.      Data Reviewed: Basic Metabolic Panel:  Recent Labs Lab 05/30/17 0518 05/31/17 0457 06/01/17 0512 06/01/17 2356 06/03/17 0454  NA 144 143 140 138 142  K 4.4 4.5 5.1 4.1 3.9  CL 111 111 110 112* 111  CO2 26 22 24 22 25   GLUCOSE 67 64* 195* 141* 100*  BUN 22* 21* 26* 23* 17  CREATININE 0.93 0.87 0.86 0.79 0.74  CALCIUM 8.2* 8.2* 8.3* 8.0* 8.3*  MG 2.1 2.0 2.1 1.9  --   PHOS  --  2.7 2.4* 1.7* 3.1   Liver Function Tests:  Recent Labs Lab 05/28/17 1212 05/29/17 0416 06/01/17 0512  AST 33 22 22  ALT 17 14 13*  ALKPHOS 75 61 55  BILITOT 1.1 1.1 0.6  PROT 7.6 6.2* 5.8*  ALBUMIN 4.5 3.7 3.1*    Recent Labs Lab 05/28/17 1212  LIPASE 36   CBC:  Recent Labs Lab 05/29/17 0416 05/31/17 0457 06/01/17 0512 06/01/17 2356 06/02/17 1116  WBC 9.3 8.4 7.8 12.4* 9.5  NEUTROABS  --   --  7.2*  --   --   HGB 12.6 11.4* 11.3* 10.8* 11.2*  HCT 37.8 33.9* 33.2* 31.9* 33.9*  MCV  97.7 98.2 96.5 96.7 98.9  PLT 188 172 162 172 176      Scheduled Meds: . diltiazem  240 mg Oral Daily  . feeding supplement (ENSURE ENLIVE)  237 mL Oral BID BM  . insulin aspart  0-9 Units Subcutaneous Q4H  . oxybutynin  5 mg Oral QHS  . pantoprazole (PROTONIX) IV  40 mg Intravenous Q12H  . simvastatin  20 mg Oral q1800  . sodium chloride flush  10-40 mL Intracatheter Q12H  . traZODone  25 mg Oral Once   Continuous Infusions: . Marland KitchenTPN (CLINIMIX-E) Adult 40 mL/hr at 06/02/17 2108  . heparin 600 Units/hr (06/03/17 1025)    Assessment/Plan:  1. Functional Bowel obstruction. Severe inflammation and esophagus stomach and duodenum. Advance to puree diet today, potentially pured diet later on today. GI recommended TPN while here.  Awaiting biopsy results for further recommendations.  I called pathology department and results still pending. 2. Atrial fibrillation with episodes of bradycardia. Decreased dose of Cardizem CD 240 mg. Coumadin level low today on repeat draw. Heparin drip restarted 3. Acute kidney injury. Improved 4. Left-sided hydronephrosis and hydroureter. Urology on call on admission recommended outpatient follow-up.  5. Hyperlipidemia unspecified. Hold on statin for now 6. Metallic heart valve. Coumadin restarted today and heparin drip started because INR load today.  Code Status:     Code Status Orders        Start     Ordered   05/28/17 1732  Full code  Continuous     05/28/17 1732    Code Status History    Date Active Date Inactive Code Status Order ID Comments User Context   This patient has a current code status but no historical code status.    Advance Directive Documentation     Most Recent Value  Type of Advance Directive  Healthcare Power of Attorney  Pre-existing out of facility DNR order (yellow form or pink MOST form)  -  "MOST" Form in Place?  -     Family Communication: Sons at the bedside Disposition Plan: to be determined  Consultants:  Gastroenterology  Surgery  Cardiology  Time spent: 24 minutes  [place], [place]

## 2017-06-04 ENCOUNTER — Inpatient Hospital Stay: Payer: Medicare Other

## 2017-06-04 LAB — CBC
HCT: 33.8 % — ABNORMAL LOW (ref 35.0–47.0)
Hemoglobin: 11.5 g/dL — ABNORMAL LOW (ref 12.0–16.0)
MCH: 33.4 pg (ref 26.0–34.0)
MCHC: 34 g/dL (ref 32.0–36.0)
MCV: 98.3 fL (ref 80.0–100.0)
PLATELETS: 155 10*3/uL (ref 150–440)
RBC: 3.43 MIL/uL — ABNORMAL LOW (ref 3.80–5.20)
RDW: 14.7 % — AB (ref 11.5–14.5)
WBC: 6.9 10*3/uL (ref 3.6–11.0)

## 2017-06-04 LAB — BASIC METABOLIC PANEL
Anion gap: 5 (ref 5–15)
BUN: 22 mg/dL — ABNORMAL HIGH (ref 6–20)
CALCIUM: 8.4 mg/dL — AB (ref 8.9–10.3)
CHLORIDE: 109 mmol/L (ref 101–111)
CO2: 27 mmol/L (ref 22–32)
CREATININE: 0.76 mg/dL (ref 0.44–1.00)
GFR calc Af Amer: >60 mL/min (ref >60)
GFR calc non Af Amer: >60 mL/min (ref >60)
Glucose, Bld: 116 mg/dL — ABNORMAL HIGH (ref 65–99)
Potassium: 4.3 mmol/L (ref 3.5–5.1)
SODIUM: 141 mmol/L (ref 135–145)

## 2017-06-04 LAB — GLUCOSE, CAPILLARY
GLUCOSE-CAPILLARY: 102 mg/dL — AB (ref 65–99)
Glucose-Capillary: 108 mg/dL — ABNORMAL HIGH (ref 65–99)
Glucose-Capillary: 116 mg/dL — ABNORMAL HIGH (ref 65–99)
Glucose-Capillary: 143 mg/dL — ABNORMAL HIGH (ref 65–99)

## 2017-06-04 LAB — SURGICAL PATHOLOGY

## 2017-06-04 LAB — HEPARIN LEVEL (UNFRACTIONATED)
HEPARIN UNFRACTIONATED: 0.29 [IU]/mL — AB (ref 0.30–0.70)
HEPARIN UNFRACTIONATED: 0.34 [IU]/mL (ref 0.30–0.70)
Heparin Unfractionated: 0.37 IU/mL (ref 0.30–0.70)

## 2017-06-04 LAB — PHOSPHORUS: Phosphorus: 2.9 mg/dL (ref 2.5–4.6)

## 2017-06-04 LAB — PROTIME-INR
INR: 1.63
PROTHROMBIN TIME: 19.2 s — AB (ref 11.4–15.2)

## 2017-06-04 LAB — MAGNESIUM: MAGNESIUM: 1.6 mg/dL — AB (ref 1.7–2.4)

## 2017-06-04 MED ORDER — WARFARIN - PHARMACIST DOSING INPATIENT
Freq: Every day | Status: DC
  Administered 2017-06-04: 18:00:00
  Administered 2017-06-05: 1
  Administered 2017-06-06 – 2017-06-08 (×2)

## 2017-06-04 MED ORDER — HEPARIN BOLUS VIA INFUSION
900.0000 [IU] | Freq: Once | INTRAVENOUS | Status: AC
  Administered 2017-06-04: 900 [IU] via INTRAVENOUS
  Filled 2017-06-04: qty 900

## 2017-06-04 MED ORDER — MAGNESIUM SULFATE 2 GM/50ML IV SOLN
2.0000 g | Freq: Once | INTRAVENOUS | Status: AC
  Administered 2017-06-04: 2 g via INTRAVENOUS
  Filled 2017-06-04: qty 50

## 2017-06-04 MED ORDER — WARFARIN SODIUM 6 MG PO TABS
6.0000 mg | ORAL_TABLET | Freq: Once | ORAL | Status: AC
  Administered 2017-06-04: 6 mg via ORAL
  Filled 2017-06-04: qty 1

## 2017-06-04 MED ORDER — LORAZEPAM 2 MG/ML IJ SOLN
0.5000 mg | Freq: Once | INTRAMUSCULAR | Status: AC
  Administered 2017-06-04: 0.5 mg via INTRAVENOUS
  Filled 2017-06-04: qty 1

## 2017-06-04 MED ORDER — DILTIAZEM HCL ER COATED BEADS 180 MG PO CP24
300.0000 mg | ORAL_CAPSULE | Freq: Every day | ORAL | Status: DC
  Administered 2017-06-05 – 2017-06-11 (×7): 300 mg via ORAL
  Filled 2017-06-04 (×7): qty 1

## 2017-06-04 MED ORDER — GADOBENATE DIMEGLUMINE 529 MG/ML IV SOLN
10.0000 mL | Freq: Once | INTRAVENOUS | Status: AC | PRN
  Administered 2017-06-04: 10 mL via INTRAVENOUS

## 2017-06-04 MED ORDER — DOCUSATE SODIUM 100 MG PO CAPS
100.0000 mg | ORAL_CAPSULE | Freq: Two times a day (BID) | ORAL | Status: DC
  Administered 2017-06-04 – 2017-06-11 (×15): 100 mg via ORAL
  Filled 2017-06-04 (×16): qty 1

## 2017-06-04 NOTE — Progress Notes (Signed)
Physical Therapy Treatment Patient Details Name: Crystal Landry MRN: [id number] DOB: Feb 05, 1927 Today's Date: 06/04/2017    History of Present Illness [name] here with a-fib/RVR and weakness. Patient has PMH significant for endometrial CA, depression, HTN, abdominal pain; CT scan showed possible blockage in esophagus. Patient underwent EGD on 9/3 for GI bleed.     PT Comments    PT session split 10:31-10:42 and 11:25 -11:40  Pt ready for session upon first arrival.  Bed mobility without assist.  She did require min a x 1 to transfer to bedside commode.  Generally decreased balance and tried to transfer before standing fully and still leaning back on bed.  Assist to help commode stability so it did not tip.  During transfer HR noted to increase to 130's.  She was able to participate in some exercises at bedside for marches, toe raises and SLR in standing x 10 with walker for support.  HR remained elevated.  Son stated she had just received cardiac medication prior to start.  Pt assisted back to bed and to return once they have time to take affect.  Returned after 45 minutes.  She again needed to use bathroom.  Min assist to transfer.  HR continued to be elevated in high 120's with activity.  90's-110's at rest.  Called primary nurse who stated she was ok for mobility if she did not have any dizziness or pain.  Pt denied and wanted to ambulate.  She was able to walk 47' with walker before HR was noted to be in 140's and she was assisted back to bed.  Discussed at length with discharge planner, Bonnita Nasuti.  She voiced concerns over pt discharging back home due to overall medical status including nutrition, need for increased nursing care and significant decline from her baseline.  Overall mobility status varies from day to day and heart rate monitoring remains a concern.  Discussed with primary PT.  SNF is appropriate at this time due to concerns as she needs monitoring and care that would not be  available at home.      Follow Up Recommendations  SNF     Equipment Recommendations  Rolling walker with 5" wheels    Recommendations for Other Services       Precautions / Restrictions Precautions Precautions: Fall Restrictions Weight Bearing Restrictions: No    Mobility  Bed Mobility Overal bed mobility: Independent Bed Mobility: Supine to Sit;Sit to Supine     Supine to sit: Supervision;HOB elevated Sit to supine: Supervision   General bed mobility comments: Used rails today  Transfers Overall transfer level: Needs assistance Equipment used: Rolling walker (2 wheeled)   Sit to Stand: Min assist         General transfer comment: transfer to commode x 2 with min assist for safety  Ambulation/Gait Ambulation/Gait assistance: Min guard Ambulation Distance (Feet): 80 Feet Assistive device: Rolling walker (2 wheeled) Gait Pattern/deviations: Step-through pattern   Gait velocity interpretation: Below normal speed for age/gender General Gait Details: ambulation limited due to heart rate   Stairs            Wheelchair Mobility    Modified Rankin (Stroke Patients Only)       Balance Overall balance assessment: Needs assistance Sitting-balance support: Feet supported Sitting balance-Leahy Scale: Good     Standing balance support: Bilateral upper extremity supported Standing balance-Leahy Scale: Fair Standing balance comment: relied on walker this session.  Kasandra Knudsen brought but not used.  Cognition Arousal/Alertness: Awake/alert Behavior During Therapy: WFL for tasks assessed/performed Overall Cognitive Status: Within Functional Limits for tasks assessed                                        Exercises      General Comments        Pertinent Vitals/Pain Pain Assessment: No/denies pain    Home Living                      Prior Function            PT Goals (current goals  can now be found in the care plan section) Progress towards PT goals: Progressing toward goals    Frequency    Min 2X/week      PT Plan Discharge plan needs to be updated    Co-evaluation              AM-PAC PT "6 Clicks" Daily Activity  Outcome Measure  Difficulty turning over in bed (including adjusting bedclothes, sheets and blankets)?: None Difficulty moving from lying on back to sitting on the side of the bed? : None Difficulty sitting down on and standing up from a chair with arms (e.g., wheelchair, bedside commode, etc,.)?: None Help needed moving to and from a bed to chair (including a wheelchair)?: A Little Help needed walking in hospital room?: A Little Help needed climbing 3-5 steps with a railing? : A Little 6 Click Score: 21    End of Session Equipment Utilized During Treatment: Gait belt Activity Tolerance: Patient limited by fatigue;No increased pain Patient left: with family/visitor present;with bed alarm set;with call bell/phone within reach;in bed Nurse Communication: Mobility status;Other (comment)       Time: 2119-4174 PT Time Calculation (min) (ACUTE ONLY): 26 min  Charges:  $Gait Training: 23-37 mins                    G Codes:       Chesley Noon, PTA 06/04/17, 12:02 PM

## 2017-06-04 NOTE — Progress Notes (Signed)
Nutrition Follow-Up Note  DOCUMENTATION CODES:   Not applicable  INTERVENTION:   Liberalize diet to GI soft   Continue Ensure Enlive po BID, each supplement provides 350 kcal and 20 grams of protein  Continue Magic cup TID with meals, each supplement provides 290 kcal and 9 grams of protein  NUTRITION DIAGNOSIS:   Inadequate oral intake related to poor appetite, nausea, vomiting as evidenced by per patient/family report. -improving   GOAL:   Patient will meet greater than or equal to 90% of their needs -progressing  MONITOR:   PO intake, Supplement acceptance, Labs, Weight trends, I & O's  ASSESSMENT:   83 yo female with PMHHx HTN, HLD, Endometrial cancer duodenal obstruction, AKI, L-sided hydronephrosis and hydroureter. Pt s/p EGD and found to have a severely edematous distal stomach with a functional obstruction. EUS and MRCP being considered.    Spoke to pt today. Pt is doing well; eating 50% of meals and drinking 2 Ensure per day. Pt does not like the pureed diet. Spoke to Dr. Vicente Males and Dr. Leslye Peer today; can liberalize pt's diet to GI soft. Pt is currently NPO for MRI today and has only been offered breakfast. Notified RN that pt's diet is being liberalized. Biopsies negative. Spoke to pt Risk analyst; will make sure pt gets hot meal after procedure.     Medications reviewed and include: colace, protonix, warfarin  Labs reviewed: BUN 22(H), Ca 8.4(L), P 2.9 wnl, Mg 1.6(L) Prealbumin 16.7(L)- 9/3  Diet Order:  Diet NPO time specified  Skin:  Reviewed, no issues  Last BM:  05/31/2017  Height:   Ht Readings from Last 1 Encounters:  05/28/17 5' 2.5" (1.588 m)    Weight:   Wt Readings from Last 1 Encounters:  06/04/17 136 lb 3.2 oz (61.8 kg)    Ideal Body Weight:  51.13 kg  BMI:  Body mass index is 24.51 kg/m.  Estimated Nutritional Needs:   Kcal:  1400-1600 calories  Protein:  74-93 grams (1.2-1.5g/kg)  Fluid:  > 1.5L  EDUCATION NEEDS:   No  education needs identified at this time  Koleen Distance MS, RD, [place] Pager #- [phone] After Hours Pager: [phone]

## 2017-06-04 NOTE — Progress Notes (Signed)
[place] for Warfarin and heparin Drip  Indication: atrial fibrillation  Allergies  Allergen Reactions  . Codeine Sulfate     Other reaction(s): Unknown  . Diphenhydramine Other (See Comments)    skin became red all over  . Ivp Dye [Iodinated Diagnostic Agents] Other (See Comments)    Skin becomes red all over  . Quinidine     Other reaction(s): Unknown Other reaction(s): Unknown    Patient Measurements: Height: 5' 2.5" (158.8 cm) Weight: 136 lb 3.2 oz (61.8 kg) IBW/kg (Calculated) : 51.25  Heparin DW: 60 kg  Vital Signs: Temp: 97.6 F (36.4 C) (09/06 1254) Temp Source: Oral (09/06 0956) BP: 112/66 (09/06 1254) Pulse Rate: 89 (09/06 1254)  Labs:  Recent Labs  06/01/17 2356 06/02/17 1116 06/03/17 0454 06/03/17 0800 06/03/17 1929 06/04/17 0357  HGB 10.8* 11.2*  --   --   --  11.5*  HCT 31.9* 33.9*  --   --   --  33.8*  PLT 172 176  --   --   --  155  LABPROT 29.9*  --  21.0* 19.4*  --  19.2*  INR 2.88  --  1.83 1.65  --  1.63  HEPARINUNFRC 0.82*  --   --   --  0.30 0.29*  CREATININE 0.79  --  0.74  --   --  0.76    Estimated Creatinine Clearance: 43.4 mL/min (by C-G formula based on SCr of 0.76 mg/dL).   Medical History: Past Medical History:  Diagnosis Date  . A-fib ([place])   . Anxiety   . Atrial fibrillation ([place])   . Cancer (HCC)    endometrial  . Depression   . Hypertension    Assessment: 83 y/o F with a known h/o atrial fibrillation and mechnical mitral valve on warfarin 4 mg daily PTA admitted with abdominal pain as well as intractable N/V. Pt warfarin was held and was given vit K to reduce INR for endoscopy.  Date INR Dose  8/30 2.62 HELD 8/31 3.1   HELD 9/1 3.96   HELD- Vit K 2.5mg  IV given bc pt needed procedure --> INR 1.51 9/2       1.47  6mg  9/3 1.44 HELD- patient may have received a 2nd dose on 9/2 9/4       2.88 HELD 9/5 1.83 6mg   9/6  1.63 6mg    Goal of Therapy:  INR 2.5-3.5  Plan 1  Warfarin:  INR still subtherapeutic this morning. Will order warfarin 6mg  x 1 dose again tonight.   Will follow daily INR levels and order doses based on trend.   Plan 2 Heparin: INR subtherapeutic- Heparin drip reordered until INR therapeutic and stable Will start with Heparin 3000u Bolus Start infusion of Heparin 600u/hr Check Anti-Xa level in 8 hours.   9/5: HL@1929 = 0.30.  Will slightly increase Heparin drip to 650 units/hr. Will f/u next Heparin level in 8 hours.  09/06 0400 heparin level 0.29. 900 unit bolus and increase rate to 750 units/hr. Recheck in 8 hours.  9/6 1348 Heparin level 0.37. Continue current rate of 750units/hr. Recheck level in 6 hours. CBC with AM labs per protocol    Pernell Dupre, PharmD, BCPS Clinical Pharmacist 06/04/2017 2:18 PM

## 2017-06-04 NOTE — Progress Notes (Addendum)
[name] for TPN and ELECTROLYTE MONITORING Indication: bowel obstruction  Patient Measurements: Height: 5' 2.5" (158.8 cm) Weight: 136 lb 3.2 oz (61.8 kg) IBW/kg (Calculated) : 51.25 TPN AdjBW (KG): 60.3 Body mass index is 24.51 kg/m.   Assessment:  83 yo female started on TPN Clinimix 5/15 w/o electrolytes at 40 ml/hr on 9/2. TPN discontinued 9/5.   Endo:  No hx of diabetes. BG 111-153  Insulin requirements in the past 24 hours: 6 units SSI (received dose of dexamethasone 9/2) Lytes: K 3.9, Mag 1.6, Phos 2.9  Renal: SCr 0.79  Best Practices: TPN Access: PICC started 9/2 TPN start date: 9/2   Plan:  Will replace magnesium with Mag Sulfate IV 2g X 1 dose.   No other supplementation is needed.     Crystal Landry, PharmD, BCPS Clinical Pharmacist 06/04/2017 7:10 AM

## 2017-06-04 NOTE — Progress Notes (Signed)
Patient ID: Crystal Landry, female   DOB: [birth year], 83 y.o.   MRN: [id number]    [place] PROGRESS NOTE  Crystal Landry MPN:[id number] DOB: 1926-11-23 DOA: 05/28/2017 PCP: [name], MD  HPI/Subjective: Patient feels well.  Feels very weak. Still urinating very frequently. Has not had a bowel movement yet.  Objective: Vitals:   06/04/17 0956 06/04/17 1254  BP: (!) 115/59 112/66  Pulse: 74 89  Resp:  18  Temp: 98.4 F (36.9 C) 97.6 F (36.4 C)  SpO2: 98% 98%    Filed Weights   06/02/17 0314 06/03/17 0342 06/04/17 0341  Weight: 63.1 kg (139 lb 1.6 oz) 62 kg (136 lb 11.2 oz) 61.8 kg (136 lb 3.2 oz)    ROS: Review of Systems  Constitutional: Negative for chills and fever.  Eyes: Negative for blurred vision.  Respiratory: Negative for cough and shortness of breath.   Cardiovascular: Negative for chest pain.  Gastrointestinal: Positive for constipation. Negative for abdominal pain, diarrhea, nausea and vomiting.  Genitourinary: Negative for dysuria.  Musculoskeletal: Negative for joint pain.  Neurological: Negative for dizziness and headaches.   Exam: Physical Exam  Constitutional: She is oriented to person, place, and time.  HENT:  Nose: No mucosal edema.  Mouth/Throat: No oropharyngeal exudate or posterior oropharyngeal edema.  Eyes: Pupils are equal, round, and reactive to light. Conjunctivae, EOM and lids are normal.  Neck: No JVD present. Carotid bruit is not present. No edema present. No thyroid mass and no thyromegaly present.  Cardiovascular: S1 normal and S2 normal.  Exam reveals no gallop.   Murmur heard.  Systolic murmur is present with a grade of 4/6  Pulses:      Dorsalis pedis pulses are 2+ on the right side, and 2+ on the left side.  Metallic heart valve  Respiratory: No respiratory distress. She has no wheezes. She has no rhonchi. She has no rales.  GI: Soft. Bowel sounds are normal. There is no tenderness.  Musculoskeletal:     Right shoulder: She exhibits no swelling.  Lymphadenopathy:    She has no cervical adenopathy.  Neurological: She is alert and oriented to person, place, and time. No cranial nerve deficit.  Skin: Skin is warm. No rash noted. Nails show no clubbing.  Psychiatric: She has a normal mood and affect.      Data Reviewed: Basic Metabolic Panel:  Recent Labs Lab 05/30/17 0518 05/31/17 0457 06/01/17 0512 06/01/17 2356 06/03/17 0454 06/04/17 0357  NA 144 143 140 138 142 141  K 4.4 4.5 5.1 4.1 3.9 4.3  CL 111 111 110 112* 111 109  CO2 26 22 24 22 25 27   GLUCOSE 67 64* 195* 141* 100* 116*  BUN 22* 21* 26* 23* 17 22*  CREATININE 0.93 0.87 0.86 0.79 0.74 0.76  CALCIUM 8.2* 8.2* 8.3* 8.0* 8.3* 8.4*  MG 2.1 2.0 2.1 1.9  --  1.6*  PHOS  --  2.7 2.4* 1.7* 3.1 2.9   Liver Function Tests:  Recent Labs Lab 05/29/17 0416 06/01/17 0512  AST 22 22  ALT 14 13*  ALKPHOS 61 55  BILITOT 1.1 0.6  PROT 6.2* 5.8*  ALBUMIN 3.7 3.1*   CBC:  Recent Labs Lab 05/31/17 0457 06/01/17 0512 06/01/17 2356 06/02/17 1116 06/04/17 0357  WBC 8.4 7.8 12.4* 9.5 6.9  NEUTROABS  --  7.2*  --   --   --   HGB 11.4* 11.3* 10.8* 11.2* 11.5*  HCT 33.9* 33.2* 31.9* 33.9* 33.8*  MCV  98.2 96.5 96.7 98.9 98.3  PLT 172 162 172 176 155      Scheduled Meds: . [START ON 06/05/2017] diltiazem  300 mg Oral Daily  . docusate sodium  100 mg Oral BID  . feeding supplement (ENSURE ENLIVE)  237 mL Oral BID BM  . insulin aspart  0-9 Units Subcutaneous Q4H  . LORazepam  0.5 mg Intravenous Once  . oxybutynin  5 mg Oral QHS  . pantoprazole (PROTONIX) IV  40 mg Intravenous Q12H  . simvastatin  20 mg Oral q1800  . sodium chloride flush  10-40 mL Intracatheter Q12H  . traZODone  25 mg Oral Once  . warfarin  6 mg Oral ONCE-1800  . Warfarin - Pharmacist Dosing Inpatient   Does not apply q1800   Continuous Infusions: . heparin 750 Units/hr (06/04/17 1342)    Assessment/Plan:  1. Functional Bowel obstruction.  Severe inflammation and esophagus stomach and duodenum.  Continue puree diet today.  Biopsy results are negative. We will get an MRI for further evaluation of the abdomen. 2. Atrial fibrillation.  Increase Cardizem CD 300 mg daily. Coumadin level low today. Pharmacy dosing Coumadin.  Heparin drip restarted yesterday 3. Acute kidney injury. Improved 4. Left-sided hydronephrosis and hydroureter. Urology on call on admission recommended outpatient follow-up.  5. Hyperlipidemia unspecified. Restart simvastatin 6. Metallic heart valve. Coumadin dosing by pharmacy and heparin drip while INR low. 7. Constipation. Add Colace 8. Urinary frequency at night and the Ditropan last night.  Code Status:     Code Status Orders        Start     Ordered   05/28/17 1732  Full code  Continuous     05/28/17 1732    Code Status History    Date Active Date Inactive Code Status Order ID Comments User Context   This patient has a current code status but no historical code status.    Advance Directive Documentation     Most Recent Value  Type of Advance Directive  Healthcare Power of Attorney  Pre-existing out of facility DNR order (yellow form or pink MOST form)  -  "MOST" Form in Place?  -     Family Communication: Son at the bedside and on the phone in afternoon Disposition Plan: to be determined  Consultants:  Gastroenterology  Surgery  Cardiology  Time spent: 24 minutes  Loletha Grayer  [employer]

## 2017-06-04 NOTE — Clinical Social Work Note (Signed)
CSW spoke to patient and her son, and they have agreed to go to SNF for short term rehab.  CSW presented bed offers and they chose [facility]  CSW contacted [facility] and they can accept patient once insurance authorization has been received and patient is medically ready for discharge.  CSW faxed required clinical information to [facility], CSW to continue to follow patient's progress throughout discharge planning.  Jones Broom. Norval Morton, MSW, [place]  06/04/2017 5:39 PM

## 2017-06-04 NOTE — Progress Notes (Signed)
[place] for Warfarin and heparin Drip  Indication: atrial fibrillation  Allergies  Allergen Reactions  . Codeine Sulfate     Other reaction(s): Unknown  . Diphenhydramine Other (See Comments)    skin became red all over  . Ivp Dye [Iodinated Diagnostic Agents] Other (See Comments)    Skin becomes red all over  . Quinidine     Other reaction(s): Unknown Other reaction(s): Unknown    Patient Measurements: Height: 5' 2.5" (158.8 cm) Weight: 136 lb 11.2 oz (62 kg) IBW/kg (Calculated) : 51.25  Heparin DW: 60 kg  Vital Signs: Temp: 98.4 F (36.9 C) (09/06 0341) Temp Source: Oral (09/06 0341) BP: 114/34 (09/06 0341) Pulse Rate: 83 (09/06 0341)  Labs:  Recent Labs  06/01/17 2356 06/02/17 1116 06/03/17 0454 06/03/17 0800 06/03/17 1929 06/04/17 0357  HGB 10.8* 11.2*  --   --   --  11.5*  HCT 31.9* 33.9*  --   --   --  33.8*  PLT 172 176  --   --   --  155  LABPROT 29.9*  --  21.0* 19.4*  --  19.2*  INR 2.88  --  1.83 1.65  --  1.63  HEPARINUNFRC 0.82*  --   --   --  0.30 0.29*  CREATININE 0.79  --  0.74  --   --  0.76    Estimated Creatinine Clearance: 43.5 mL/min (by C-G formula based on SCr of 0.76 mg/dL).   Medical History: Past Medical History:  Diagnosis Date  . A-fib ([place])   . Anxiety   . Atrial fibrillation ([place])   . Cancer (HCC)    endometrial  . Depression   . Hypertension    Assessment: 83 y/o F with a known h/o atrial fibrillation and mechnical mitral valve on warfarin 4 mg daily PTA admitted with abdominal pain as well as intractable N/V. Pt warfarin was held and was given vit K to reduce INR for endoscopy.  Date INR Dose  8/30 2.62 HELD 8/31 3.1   HELD 9/1 3.96   HELD- Vit K 2.5mg  IV given bc pt needed procedure --> INR 1.51 9/2       1.47  6mg  9/3 1.44 HELD- patient may have received a 2nd dose on 9/2 9/4       2.88 HELD 9/5 1.83 6mg     Goal of Therapy:  INR 2.5-3.5  Plan 1 Warfarin:  INR  subtherapeutic this morning. Will order warfarin 6mg  x 1 dose none.   Will follow daily INR levels and order doses based on trend.   Plan 2 Heparin: INR subtherapeutic- Heparin drip reordered until INR therapeutic and stable Will start with Heparin 3000u Bolus Start infusion of Heparin 600u/hr Check Anti-Xa level in 8 hours.   9/5: HL@1929 = 0.30.  Will slightly increase Heparin drip to 650 units/hr. Will f/u next Heparin level in 8 hours.  09/06 0400 heparin level 0.29. 900 unit bolus and increase rate to 750 units/hr. Recheck in 8 hours.   Eloise Harman, PharmD, BCPS Clinical Pharmacist 06/04/2017 5:19 AM

## 2017-06-04 NOTE — Care Management Note (Signed)
Case Management Note  Patient Details  Name: Crystal Landry MRN: [id number] Date of Birth: 09-Aug-1927  Subjective/Objective:                 spoke with patient and son Crystal Landry regarding need for 2 discharge plans: SNF and home with home health.  Discussed that must have a plan A and B in the event that insurance company does not  approve skilled nursing.  Reviewed the contact list for home health agencies and private in home care.  Crystal Landry asked CM to call home health agencies that are in network with [facility] then give him the list of agencies to choose from.  Physical therapy attempted to initiate a treatment session and heart rate went yup to the 140's.  This is another reason why skilled nursing facility placement at discharge would be medically necessary as need close monitoring of cardiac status also  Action/Plan: CSW is searching for bed and will initiate auth process.  CM contacting home health agencies for in network agencies.  Expected Discharge Date:                  Expected Discharge Plan:     In-House Referral:     Discharge planning Services     Post Acute Care Choice:    Choice offered to:     DME Arranged:    DME Agency:     HH Arranged:    HH Agency:     Status of Service:     If discussed at [employer], dates discussed:    Additional Comments:  Katrina Stack, RN 06/04/2017, 11:46 AM

## 2017-06-04 NOTE — Progress Notes (Signed)
Occupational Therapy Treatment Patient Details Name: Crystal Landry MRN: [id number] DOB: 1927-03-20 Today's Date: 06/04/2017    History of present illness [name] here with a-fib/RVR and weakness. Patient has PMH significant for endometrial CA, depression, HTN, abdominal pain; CT scan showed possible blockage in esophagus. Patient underwent EGD on 9/3 for GI bleed.    OT comments  Pt seen for OT treatment session this date. Pt eager to participate. Educated in energy conservation strategies to support falls prevention and functional independence (see details below). Pt performed stand pivot transfer from EOB to [facility] with min guard and set up for hygiene after toileting task. Ambulated with RW and min guard to sink to wash hands. No SOB, VSS, HR in high 50's to low 70's throughout session. Pt progressing towards goals. Continues to benefit from skilled OT in order to maximize return to PLOF.    Follow Up Recommendations  SNF    Equipment Recommendations  Other (comment) (consider BSC beside bed overnight, elevated toilet seat, reacher/sock aid/LH shoe horn for dressing)    Recommendations for Other Services      Precautions / Restrictions Precautions Precautions: Fall Restrictions Weight Bearing Restrictions: No       Mobility Bed Mobility Overal bed mobility: Independent Bed Mobility: Supine to Sit;Sit to Supine     Supine to sit: Supervision;HOB elevated Sit to supine: Supervision   General bed mobility comments: Used rails today, good effort  Transfers Overall transfer level: Needs assistance Equipment used: Rolling walker (2 wheeled) Transfers: Sit to/from Stand Sit to Stand: Min guard         General transfer comment: transfer to commode x 2 with min assist for safety    Balance Overall balance assessment: Needs assistance Sitting-balance support: Feet supported Sitting balance-Leahy Scale: Good     Standing balance support: Bilateral upper extremity  supported Standing balance-Leahy Scale: Fair Standing balance comment: RW used                           ADL either performed or assessed with clinical judgement   ADL Overall ADL's : Needs assistance/impaired Eating/Feeding: Set up;Sitting   Grooming: Min guard;Wash/dry hands;Standing Grooming Details (indicate cue type and reason): pt stood at sink to wash hands with no SOB and no LOB Upper Body Bathing: Sitting;Set up;Supervision/ safety Upper Body Bathing Details (indicate cue type and reason): + additional time to complete Lower Body Bathing: Sitting/lateral leans;Set up;Min guard;Sit to/from stand Lower Body Bathing Details (indicate cue type and reason): + additional time to complete Upper Body Dressing : Sitting;Set up   Lower Body Dressing: Sitting/lateral leans;Sit to/from stand;Set up;Min guard   Toilet Transfer: Min Statistician Details (indicate cue type and reason): min guard from EOB<>BSC with no LOB Toileting- Clothing Manipulation and Hygiene: Sitting/lateral lean;Independent;Set up [place] Manipulation Details (indicate cue type and reason): indep after set up     Functional mobility during ADLs: Min guard;Rolling walker General ADL Comments: Pt continues to be limited by poor activity tolerance. HR in high 50's to low 70's throughout session, O2 sats >97% on room air throughout.     Vision Baseline Vision/History: Wears glasses Wears Glasses: Reading only Patient Visual Report: No change from baseline Vision Assessment?: No apparent visual deficits   Perception     Praxis      Cognition Arousal/Alertness: Awake/alert Behavior During Therapy: WFL for tasks assessed/performed Overall Cognitive Status: Within Functional Limits for tasks assessed  Exercises Other Exercises Other Exercises: Pt educated in energy conservation strategies including  pursed lip breathing, DME/AE, home/routines modifications, and work simplification strategies to support safety and functional independence. Pt verbalized understanding of all education provided via verbal instruction and visual demonstration.   Shoulder Instructions       General Comments      Pertinent Vitals/ Pain       Pain Assessment: No/denies pain  Home Living                                          Prior Functioning/Environment              Frequency  Min 1X/week        Progress Toward Goals  OT Goals(current goals can now be found in the care plan section)  Progress towards OT goals: Progressing toward goals  Acute Rehab OT Goals Patient Stated Goal: Get my energy back. OT Goal Formulation: With patient Time For Goal Achievement: 06/16/17 Potential to Achieve Goals: Good  Plan Discharge plan remains appropriate;Frequency remains appropriate    Co-evaluation                 AM-PAC PT "6 Clicks" Daily Activity     Outcome Measure   Help from another person eating meals?: None   Help from another person toileting, which includes using toliet, bedpan, or urinal?: A Little Help from another person bathing (including washing, rinsing, drying)?: A Little Help from another person to put on and taking off regular upper body clothing?: None Help from another person to put on and taking off regular lower body clothing?: A Little 6 Click Score: 17    End of Session Equipment Utilized During Treatment: Gait belt  OT Visit Diagnosis: Other abnormalities of gait and mobility (R26.89);Muscle weakness (generalized) (M62.81)   Activity Tolerance Patient tolerated treatment well   Patient Left in bed;with call bell/phone within reach;with bed alarm set;with family/visitor present   Nurse Communication          Time: 0160-1093 OT Time Calculation (min): 28 min  Charges: OT General Charges $OT Visit: 1 Visit OT Treatments $Self  Care/Home Management : 23-37 mins  Jeni Salles, MPH, MS, OTR/L ascom [phone] 06/04/17, 2:49 PM

## 2017-06-04 NOTE — Progress Notes (Addendum)
[place] for Warfarin and heparin Drip  Indication: atrial fibrillation  Allergies  Allergen Reactions  . Codeine Sulfate     Other reaction(s): Unknown  . Diphenhydramine Other (See Comments)    skin became red all over  . Ivp Dye [Iodinated Diagnostic Agents] Other (See Comments)    Skin becomes red all over  . Quinidine     Other reaction(s): Unknown Other reaction(s): Unknown    Patient Measurements: Height: 5' 2.5" (158.8 cm) Weight: 136 lb 3.2 oz (61.8 kg) IBW/kg (Calculated) : 51.25  Heparin DW: 60 kg  Vital Signs: Temp: 98.4 F (36.9 C) (09/06 1940) Temp Source: Oral (09/06 1940) BP: 104/46 (09/06 1940) Pulse Rate: 91 (09/06 1940)  Labs:  Recent Labs  06/01/17 2356 06/02/17 1116 06/03/17 0454 06/03/17 0800  06/04/17 0357 06/04/17 1348 06/04/17 2100  HGB 10.8* 11.2*  --   --   --  11.5*  --   --   HCT 31.9* 33.9*  --   --   --  33.8*  --   --   PLT 172 176  --   --   --  155  --   --   LABPROT 29.9*  --  21.0* 19.4*  --  19.2*  --   --   INR 2.88  --  1.83 1.65  --  1.63  --   --   HEPARINUNFRC 0.82*  --   --   --   < > 0.29* 0.37 0.34  CREATININE 0.79  --  0.74  --   --  0.76  --   --   < > = values in this interval not displayed.  Estimated Creatinine Clearance: 43.4 mL/min (by C-G formula based on SCr of 0.76 mg/dL).   Medical History: Past Medical History:  Diagnosis Date  . A-fib ([place])   . Anxiety   . Atrial fibrillation ([place])   . Cancer (HCC)    endometrial  . Depression   . Hypertension    Assessment: 83 y/o F with a known h/o atrial fibrillation and mechnical mitral valve on warfarin 4 mg daily PTA admitted with abdominal pain as well as intractable N/V. Pt warfarin was held and was given vit K to reduce INR for endoscopy.  Date INR Dose  8/30 2.62 HELD 8/31 3.1   HELD 9/1 3.96   HELD- Vit K 2.5mg  IV given bc pt needed procedure --> INR 1.51 9/2       1.47  6mg  9/3 1.44 HELD- patient may have  received a 2nd dose on 9/2 9/4       2.88 HELD 9/5 1.83 6mg   9/6  1.63 6mg    Goal of Therapy:  INR 2.5-3.5  Plan 1 Warfarin:  INR still subtherapeutic this morning. Will order warfarin 6mg  x 1 dose again tonight.   Will follow daily INR levels and order doses based on trend.   Plan 2 Heparin: INR subtherapeutic- Heparin drip reordered until INR therapeutic and stable Will start with Heparin 3000u Bolus Start infusion of Heparin 600u/hr Check Anti-Xa level in 8 hours.   9/5: HL@1929 = 0.30.  Will slightly increase Heparin drip to 650 units/hr. Will f/u next Heparin level in 8 hours.  09/06 0400 heparin level 0.29. 900 unit bolus and increase rate to 750 units/hr. Recheck in 8 hours.  9/6 1348 Heparin level 0.37. Continue current rate of 750units/hr. Recheck level in 6 hours. CBC with AM labs per protocol  9/6@2211  Heparin  level 0.34, will check with am labs and continue current rate of 750units/hr.   Donna Christen Coffee, PharmD, BCPS Clinical Pharmacist 06/04/2017 10:11 PM   09/07 AM heparin level 0.43. Continue current regimen. Recheck CBC/HL with tomorrow AM labs.  Sim Boast, PharmD, BCPS  06/05/17 5:34 AM

## 2017-06-04 NOTE — NC FL2 (Signed)
[place] LEVEL OF CARE SCREENING TOOL     IDENTIFICATION  Patient Name: Crystal Landry Birthdate: [birth year] Sex: female Admission Date (Current Location): 05/28/2017  [place] and [place] Number:  Engineering geologist and Address:  [facility], [address] [place], [place] [address]      Provider Number: [id number]  Attending Physician Name and Address:  Loletha Grayer, MD  Relative Name and Phone Number:  Mckenzee, Beem   [phone] or Joesphine, Schemm   [phone]     Current Level of Care: Hospital Recommended Level of Care: [place] Prior Approval Number:    Date Approved/Denied:   PASRR Number: [id number]  Discharge Plan: SNF    Current Diagnoses: Patient Active Problem List   Diagnosis Date Noted  . Encounter for imaging study to confirm nasogastric (NG) tube placement   . Atrial fibrillation with RVR ([place]) 05/28/2017  . Duodenal obstruction   . Non-intractable vomiting with nausea   . Anxiety, generalized 10/04/2015  . Fatigue 10/04/2015  . False positive serological test for syphilis 10/17/2014  . Other specified abnormal immunological findings in serum 10/17/2014  . Cancer of endometrium ([place]) 10/13/2014  . Malignant neoplasm of endometrium ([place]) 10/13/2014  . H/O prosthetic heart valve 04/10/2014  . HLD (hyperlipidemia) 04/10/2014  . BP (high blood pressure) 04/10/2014  . A-fib ([place]) 01/25/2014  . Atrial fibrillation ([place]) 01/25/2014    Orientation RESPIRATION BLADDER Height & Weight     Self, Time, Situation, Place  Normal Continent Weight: 136 lb 3.2 oz (61.8 kg) Height:  5' 2.5" (158.8 cm)  BEHAVIORAL SYMPTOMS/MOOD NEUROLOGICAL BOWEL NUTRITION STATUS      Continent Diet (Pureed diet)  AMBULATORY STATUS COMMUNICATION OF NEEDS Skin   Limited Assist Verbally Normal                       Personal Care Assistance Level of Assistance  Bathing, Feeding, Dressing Bathing  Assistance: Limited assistance Feeding assistance: Independent Dressing Assistance: Limited assistance     Functional Limitations Info  Sight, Hearing, Speech Sight Info: Adequate Hearing Info: Impaired Speech Info: Adequate    SPECIAL CARE FACTORS FREQUENCY  PT (By licensed PT), OT (By licensed OT)     PT Frequency: 5x a week OT Frequency: 5x a week            Contractures Contractures Info: Not present    Additional Factors Info  Insulin Sliding Scale Code Status Info: Full Allergies Info: CODEINE SULFATE, DIPHENHYDRAMINE, IVP DYE IODINATED DIAGNOSTIC AGENTS, QUINIDINE Psychotropic Info: Lorazepatm (Ativan) injection 0.5 mg,  Insulin Sliding Scale Info: insulin aspart (novoLOG) injection 0-9 Units       Current Medications (06/04/2017):  This is the current hospital active medication list Current Facility-Administered Medications  Medication Dose Route Frequency Provider Last Rate Last Dose  . acetaminophen (TYLENOL) tablet 650 mg  650 mg Oral Q6H PRN Gouru, Aruna, MD   325 mg at 05/31/17 2151   Or  . acetaminophen (TYLENOL) suppository 650 mg  650 mg Rectal Q6H PRN Nicholes Mango, MD   650 mg at 05/29/17 2144  . diltiazem (CARDIZEM CD) 24 hr capsule 240 mg  240 mg Oral Daily Loletha Grayer, MD   240 mg at 06/04/17 1022  . feeding supplement (ENSURE ENLIVE) (ENSURE ENLIVE) liquid 237 mL  237 mL Oral BID BM Wieting, Richard, MD   237 mL at 06/04/17 1023  . heparin ADULT infusion 100 units/mL (25000 units/257mL sodium chloride  0.45%)  750 Units/hr Intravenous Continuous Loletha Grayer, MD 7.5 mL/hr at 06/04/17 0621 750 Units/hr at 06/04/17 0621  . insulin aspart (novoLOG) injection 0-9 Units  0-9 Units Subcutaneous Q4H Ramond Dial, RPH   1 Units at 06/04/17 1159  . lidocaine (XYLOCAINE) 2 % viscous mouth solution 15 mL  15 mL Mouth/Throat Q4H PRN Loletha Grayer, MD   15 mL at 05/29/17 1557  . LORazepam (ATIVAN) injection 0.5 mg  0.5 mg Intravenous Q8H PRN Gouru,  Aruna, MD   0.5 mg at 06/03/17 1042  . LORazepam (ATIVAN) injection 0.5 mg  0.5 mg Intravenous Once Loletha Grayer, MD      . ondansetron [facility]) tablet 4 mg  4 mg Oral Q6H PRN Gouru, Aruna, MD       Or  . ondansetron (ZOFRAN) injection 4 mg  4 mg Intravenous Q6H PRN Gouru, Aruna, MD      . oxybutynin (DITROPAN-XL) 24 hr tablet 5 mg  5 mg Oral QHS Loletha Grayer, MD   5 mg at 06/03/17 2059  . pantoprazole (PROTONIX) injection 40 mg  40 mg Intravenous Q12H Gouru, Aruna, MD   40 mg at 06/04/17 1022  . simvastatin (ZOCOR) tablet 20 mg  20 mg Oral q1800 Loletha Grayer, MD   20 mg at 06/03/17 1808  . sodium chloride flush (NS) 0.9 % injection 10-40 mL  10-40 mL Intracatheter Q12H Wieting, Richard, MD   10 mL at 06/04/17 1030  . sodium chloride flush (NS) 0.9 % injection 10-40 mL  10-40 mL Intracatheter PRN Wieting, Richard, MD      . temazepam (RESTORIL) capsule 7.5 mg  7.5 mg Oral QHS PRN Loletha Grayer, MD   7.5 mg at 06/03/17 2059  . traZODone (DESYREL) tablet 25 mg  25 mg Oral Once Hugelmeyer, Alexis, DO      . warfarin (COUMADIN) tablet 6 mg  6 mg Oral ONCE-1800 Hallaji, Sheema M, RPH      . Warfarin - Pharmacist Dosing Inpatient   Does not apply q1800 Pernell Dupre, [facility]         Discharge Medications: Please see discharge summary for a list of discharge medications.  Relevant Imaging Results:  Relevant Lab Results:   Additional Information SSN [id number]  Ross Ludwig, [place]

## 2017-06-05 ENCOUNTER — Encounter
Admission: RE | Admit: 2017-06-05 | Discharge: 2017-06-05 | Disposition: A | Discharge status: Home / Self Care | Payer: Medicare Other | Source: Ambulatory Visit | Attending: Internal Medicine | Admitting: Internal Medicine

## 2017-06-05 DIAGNOSIS — R112 Nausea with vomiting, unspecified: Secondary | ICD-10-CM

## 2017-06-05 LAB — GLUCOSE, CAPILLARY
GLUCOSE-CAPILLARY: 106 mg/dL — AB (ref 65–99)
GLUCOSE-CAPILLARY: 148 mg/dL — AB (ref 65–99)
Glucose-Capillary: 107 mg/dL — ABNORMAL HIGH (ref 65–99)

## 2017-06-05 LAB — CBC
HCT: 33 % — ABNORMAL LOW (ref 35.0–47.0)
Hemoglobin: 11.5 g/dL — ABNORMAL LOW (ref 12.0–16.0)
MCH: 33.9 pg (ref 26.0–34.0)
MCHC: 34.7 g/dL (ref 32.0–36.0)
MCV: 97.5 fL (ref 80.0–100.0)
PLATELETS: 133 10*3/uL — AB (ref 150–440)
RBC: 3.38 MIL/uL — AB (ref 3.80–5.20)
RDW: 15 % — ABNORMAL HIGH (ref 11.5–14.5)
WBC: 7.8 10*3/uL (ref 3.6–11.0)

## 2017-06-05 LAB — PROTIME-INR
INR: 1.56
PROTHROMBIN TIME: 18.5 s — AB (ref 11.4–15.2)

## 2017-06-05 LAB — HEPARIN LEVEL (UNFRACTIONATED): HEPARIN UNFRACTIONATED: 0.43 [IU]/mL (ref 0.30–0.70)

## 2017-06-05 MED ORDER — ALPRAZOLAM 0.5 MG PO TABS: 0.5000 mg | ORAL_TABLET | Freq: Two times a day (BID) | ORAL | 0 refills | Status: AC | PRN

## 2017-06-05 MED ORDER — OXYBUTYNIN CHLORIDE ER 5 MG PO TB24
5.0000 mg | ORAL_TABLET | Freq: Every day | ORAL | 0 refills | Status: AC
Start: 2017-06-05 — End: ?

## 2017-06-05 MED ORDER — TEMAZEPAM 15 MG PO CAPS: 15.0000 mg | ORAL_CAPSULE | Freq: Every evening | ORAL | 0 refills | Status: AC | PRN

## 2017-06-05 MED ORDER — DOCUSATE SODIUM 100 MG PO CAPS: 100.0000 mg | ORAL_CAPSULE | Freq: Two times a day (BID) | ORAL | 0 refills | Status: AC

## 2017-06-05 MED ORDER — WARFARIN SODIUM 4 MG PO TABS
8.0000 mg | ORAL_TABLET | Freq: Once | ORAL | Status: AC
  Administered 2017-06-05: 8 mg via ORAL
  Filled 2017-06-05: qty 2

## 2017-06-05 MED ORDER — PANTOPRAZOLE SODIUM 40 MG PO TBEC: 40.0000 mg | DELAYED_RELEASE_TABLET | Freq: Two times a day (BID) | ORAL | 0 refills | Status: AC

## 2017-06-05 MED ORDER — ENSURE ENLIVE PO LIQD: 237.0000 mL | Freq: Two times a day (BID) | ORAL | 0 refills | Status: AC

## 2017-06-05 NOTE — Progress Notes (Signed)
[place] for Warfarin and heparin Drip  Indication: atrial fibrillation  Allergies  Allergen Reactions  . Codeine Sulfate     Other reaction(s): Unknown  . Diphenhydramine Other (See Comments)    skin became red all over  . Ivp Dye [Iodinated Diagnostic Agents] Other (See Comments)    Skin becomes red all over  . Quinidine     Other reaction(s): Unknown Other reaction(s): Unknown    Patient Measurements: Height: 5' 2.5" (158.8 cm) Weight: 133 lb 9.6 oz (60.6 kg) IBW/kg (Calculated) : 51.25  Heparin DW: 60 kg  Vital Signs: Temp: 98 F (36.7 C) (09/07 0429) Temp Source: Oral (09/07 0429) BP: 119/59 (09/07 0429) Pulse Rate: 70 (09/07 0429)  Labs:  Recent Labs  06/02/17 1116  06/03/17 0454 06/03/17 0800  06/04/17 0357 06/04/17 1348 06/04/17 2100 06/05/17 0359  HGB 11.2*  --   --   --   --  11.5*  --   --  11.5*  HCT 33.9*  --   --   --   --  33.8*  --   --  33.0*  PLT 176  --   --   --   --  155  --   --  133*  LABPROT  --   < > 21.0* 19.4*  --  19.2*  --   --  18.5*  INR  --   < > 1.83 1.65  --  1.63  --   --  1.56  HEPARINUNFRC  --   --   --   --   < > 0.29* 0.37 0.34 0.43  CREATININE  --   --  0.74  --   --  0.76  --   --   --   < > = values in this interval not displayed.  Estimated Creatinine Clearance: 40.1 mL/min (by C-G formula based on SCr of 0.76 mg/dL).   Medical History: Past Medical History:  Diagnosis Date  . A-fib ([place])   . Anxiety   . Atrial fibrillation ([place])   . Cancer ([facility])    endometrial  . Depression   . Hypertension    Assessment: 83 y/o F with a known h/o atrial fibrillation and mechnical mitral valve on warfarin 4 mg daily PTA admitted with abdominal pain as well as intractable N/V. Pt warfarin was held and was given vit K to reduce INR for endoscopy.  Date INR Dose  8/30 2.62 HELD 8/31 3.1   HELD 9/1 3.96   HELD- Vit K 2.5mg  IV given bc pt needed procedure --> INR 1.51 9/2       1.47   6mg  9/3 1.44 HELD- patient may have received a 2nd dose on 9/2 9/4       2.88 HELD 9/5 1.83 6mg   9/6  1.63 6mg  9/7 1.56 8mg     Goal of Therapy:  INR 2.5-3.5  Plan 1 Warfarin:  INR still subtherapeutic this morning. Unsure why INR is trending down. Patients home regimen is 4mg  daily- Will order warfarin 8mg  x 1 dose tonight.   Will follow daily INR levels and order doses based on trend.   Plan 2 Heparin: INR subtherapeutic- Heparin drip reordered until INR therapeutic and stable Will start with Heparin 3000u Bolus Start infusion of Heparin 600u/hr Check Anti-Xa level in 8 hours.   9/5: HL@1929 = 0.30.  Will slightly increase Heparin drip to 650 units/hr. Will f/u next Heparin level in 8 hours.  09/06 0400 heparin level  0.29. 900 unit bolus and increase rate to 750 units/hr. Recheck in 8 hours.  9/6 1348 Heparin level 0.37. Continue current rate of 750units/hr. Recheck level in 6 hours. CBC with AM labs per protocol  9/6@2211  Heparin level 0.34, will check with am labs and continue current rate of 750units/hr.   09/07 AM heparin level 0.43. Continue current regimen. Recheck CBC/HL with tomorrow AM labs.  Pernell Dupre, PharmD, BCPS Clinical Pharmacist 06/05/2017 7:50 AM

## 2017-06-05 NOTE — Progress Notes (Signed)
Patient ID: Crystal Landry, female   DOB: [birth year], 83 y.o.   MRN: [id number]    [place] PROGRESS NOTE  Crystal Landry EQA:[id number] DOB: 23-Nov-1926 DOA: 05/28/2017 PCP: [name], MD  HPI/Subjective: Patient feels well.  Feels very weak. Still urinating very frequently. Has not had a bowel movement yet.  Objective: Vitals:   06/05/17 0429 06/05/17 1258  BP: (!) 119/59 106/64  Pulse: 70 (!) 108  Resp:  17  Temp: 98 F (36.7 C) (!) 97.5 F (36.4 C)  SpO2: 96% 95%    Filed Weights   06/03/17 0342 06/04/17 0341 06/05/17 0500  Weight: 62 kg (136 lb 11.2 oz) 61.8 kg (136 lb 3.2 oz) 60.6 kg (133 lb 9.6 oz)    ROS: Review of Systems  Constitutional: Negative for chills and fever.  Eyes: Negative for blurred vision.  Respiratory: Negative for cough and shortness of breath.   Cardiovascular: Negative for chest pain.  Gastrointestinal: Positive for constipation. Negative for abdominal pain, diarrhea, nausea and vomiting.  Genitourinary: Negative for dysuria.  Musculoskeletal: Negative for joint pain.  Neurological: Negative for dizziness and headaches.   Exam: Physical Exam  Constitutional: She is oriented to person, place, and time.  HENT:  Nose: No mucosal edema.  Mouth/Throat: No oropharyngeal exudate or posterior oropharyngeal edema.  Eyes: Pupils are equal, round, and reactive to light. Conjunctivae, EOM and lids are normal.  Neck: No JVD present. Carotid bruit is not present. No edema present. No thyroid mass and no thyromegaly present.  Cardiovascular: S1 normal and S2 normal.  Exam reveals no gallop.   Murmur heard.  Systolic murmur is present with a grade of 4/6  Pulses:      Dorsalis pedis pulses are 2+ on the right side, and 2+ on the left side.  Metallic heart valve  Respiratory: No respiratory distress. She has no wheezes. She has no rhonchi. She has no rales.  GI: Soft. Bowel sounds are normal. There is no tenderness.   Musculoskeletal:       Right shoulder: She exhibits no swelling.  Lymphadenopathy:    She has no cervical adenopathy.  Neurological: She is alert and oriented to person, place, and time. No cranial nerve deficit.  Skin: Skin is warm. No rash noted. Nails show no clubbing.  Psychiatric: She has a normal mood and affect.      Data Reviewed: Basic Metabolic Panel:  Recent Labs Lab 05/30/17 0518 05/31/17 0457 06/01/17 0512 06/01/17 2356 06/03/17 0454 06/04/17 0357  NA 144 143 140 138 142 141  K 4.4 4.5 5.1 4.1 3.9 4.3  CL 111 111 110 112* 111 109  CO2 26 22 24 22 25 27   GLUCOSE 67 64* 195* 141* 100* 116*  BUN 22* 21* 26* 23* 17 22*  CREATININE 0.93 0.87 0.86 0.79 0.74 0.76  CALCIUM 8.2* 8.2* 8.3* 8.0* 8.3* 8.4*  MG 2.1 2.0 2.1 1.9  --  1.6*  PHOS  --  2.7 2.4* 1.7* 3.1 2.9   Liver Function Tests:  Recent Labs Lab 06/01/17 0512  AST 22  ALT 13*  ALKPHOS 55  BILITOT 0.6  PROT 5.8*  ALBUMIN 3.1*   CBC:  Recent Labs Lab 06/01/17 0512 06/01/17 2356 06/02/17 1116 06/04/17 0357 06/05/17 0359  WBC 7.8 12.4* 9.5 6.9 7.8  NEUTROABS 7.2*  --   --   --   --   HGB 11.3* 10.8* 11.2* 11.5* 11.5*  HCT 33.2* 31.9* 33.9* 33.8* 33.0*  MCV 96.5 96.7 98.9  98.3 97.5  PLT 162 172 176 155 133*      Scheduled Meds: . diltiazem  300 mg Oral Daily  . docusate sodium  100 mg Oral BID  . feeding supplement (ENSURE ENLIVE)  237 mL Oral BID BM  . oxybutynin  5 mg Oral QHS  . pantoprazole (PROTONIX) IV  40 mg Intravenous Q12H  . simvastatin  20 mg Oral q1800  . sodium chloride flush  10-40 mL Intracatheter Q12H  . traZODone  25 mg Oral Once  . warfarin  8 mg Oral ONCE-1800  . Warfarin - Pharmacist Dosing Inpatient   Does not apply q1800   Continuous Infusions: . heparin 750 Units/hr (06/05/17 1000)    Assessment/Plan:  1. Functional Bowel obstruction. Severe inflammation and esophagus stomach and duodenum.  Continue soft diet.  Biopsy results are negative. MRI did not  show any mass. It did show a 6 mm cystic lesion on the pancreas which was limited with motion. 2. Atrial fibrillation.  Continue Cardizem CD 300 mg daily. Coumadin level low today. Pharmacy dosing Coumadin.  Heparin drip restarted yesterday 3. Acute kidney injury. Improved 4. Left-sided hydronephrosis and hydroureter. Urology on call on admission recommended outpatient follow-up.  5. Hyperlipidemia unspecified. Restart simvastatin 6. Metallic heart valve. Coumadin dosing by pharmacy and heparin drip while INR low. 7. Constipation. Added Colace 8. Urinary frequency at night and the Ditropan last night.  Code Status:     Code Status Orders        Start     Ordered   05/28/17 1732  Full code  Continuous     05/28/17 1732    Code Status History    Date Active Date Inactive Code Status Order ID Comments User Context   This patient has a current code status but no historical code status.    Advance Directive Documentation     Most Recent Value  Type of Advance Directive  Healthcare Power of Attorney  Pre-existing out of facility DNR order (yellow form or pink MOST form)  -  "MOST" Form in Place?  -     Family Communication: Son at the bedside Disposition Plan: likely will need rehabilitation for weakness.  Consultants:  Gastroenterology  Surgery  Cardiology  Time spent: 25 minutes  [place], [place]

## 2017-06-05 NOTE — Progress Notes (Signed)
PT Cancellation Note  Patient Details Name: DAWNITA MOLNER MRN: [id number] DOB: [birth year]   Cancelled Treatment:    Reason Eval/Treat Not Completed: Patient declined, no reason specified Pt reports that she had just been doing some walking/activity.  Did not feel like she could do anything more at this time.   Kreg Shropshire, DPT 06/05/2017, 4:57 PM

## 2017-06-05 NOTE — Discharge Summary (Signed)
[facility] NAME: Elisea Khader    MR#:  [id number]  DATE OF BIRTH:  21-Aug-1927  DATE OF ADMISSION:  05/28/2017 ADMITTING PHYSICIAN: Nicholes Mango, MD  DATE OF DISCHARGE: 06/07/2017  PRIMARY CARE PHYSICIAN: [name], MD    ADMISSION DIAGNOSIS:  Abdominal pain, unspecified abdominal location [R10.9] Non-intractable vomiting with nausea, unspecified vomiting type [R11.2]  DISCHARGE DIAGNOSIS:  Active Problems:   Atrial fibrillation with RVR ([place])   Encounter for imaging study to confirm nasogastric (NG) tube placement   SECONDARY DIAGNOSIS:   Past Medical History:  Diagnosis Date  . A-fib ([place])   . Anxiety   . Atrial fibrillation ([place])   . Cancer (HCC)    endometrial  . Depression   . Hypertension     HOSPITAL COURSE:   1.  Functional bowel obstruction.  We had to wait for the INR to come down. Since the INR was rising we had to give vitamin K 2.5 mg IV 1.INR came down enough for endoscopy on 05/31/2017.  Endoscopy shows severe inflammation esophagus stomach and duodenum. She was started on TPN and gradually advanced from liquid diet to soft diet.  Biopsy results negative for cancers process. MRI of the abdomen did not show any specific mass but did show a 6 mm pancreatic cystwhich will need to be followed as outpatient. 2. Atrial fibrillation with rapid ventricular response. The patient needed Cardizem drip initially. Once able to tolerate diet she was switched over to oral Cardizem.  INR has been resistant to coming up secondary to vitamin K. Pharmacy dosing Coumadin. Patient still on heparin drip.  Coumadin dose may change upon disposition 3. Metallic heart valve.  Coumadin dosing by pharmacy. Heparin drip while INR low. INR will need to be therapeutic prior to disposition.  Coumadin dose may change upon disposition 4. Acute kidney injury improved with IV fluid hydration 5. Left-sided hydronephrosis and hydroureter.  Urology was contacted on admission and they recommended outpatient follow-up. 6. Hyperlipidemia unspecified on simvastatin 7. Constipation Colace added 8. Urinary frequency at night Ditropan at night  DISCHARGE CONDITIONS:  satisfactory  CONSULTS OBTAINED:  Treatment Team:  Yolonda Kida, MD  DRUG ALLERGIES:   Allergies  Allergen Reactions  . Codeine Sulfate     Other reaction(s): Unknown  . Diphenhydramine Other (See Comments)    skin became red all over  . Ivp Dye [Iodinated Diagnostic Agents] Other (See Comments)    Skin becomes red all over  . Quinidine     Other reaction(s): Unknown Other reaction(s): Unknown    DISCHARGE MEDICATIONS:   Current Discharge Medication List    START taking these medications   Details  docusate sodium (COLACE) 100 MG capsule Take 1 capsule (100 mg total) by mouth 2 (two) times daily. Qty: 60 capsule, Refills: 0    feeding supplement, ENSURE ENLIVE, (ENSURE ENLIVE) LIQD Take 237 mLs by mouth 2 (two) times daily between meals. Qty: 60 Bottle, Refills: 0    oxybutynin (DITROPAN-XL) 5 MG 24 hr tablet Take 1 tablet (5 mg total) by mouth at bedtime. Qty: 30 tablet, Refills: 0    pantoprazole (PROTONIX) 40 MG tablet Take 1 tablet (40 mg total) by mouth 2 (two) times daily. Qty: 60 tablet, Refills: 0      CONTINUE these medications which have CHANGED   Details  ALPRAZolam (XANAX) 0.5 MG tablet Take 1 tablet (0.5 mg total) by mouth 2 (two) times daily as needed for anxiety. Qty:  60 tablet, Refills: 0    temazepam (RESTORIL) 15 MG capsule Take 1 capsule (15 mg total) by mouth at bedtime as needed for sleep. Qty: 30 capsule, Refills: 0      CONTINUE these medications which have NOT CHANGED   Details  acetaminophen (TYLENOL) 325 MG tablet Take 650 mg by mouth every 4 (four) hours as needed for mild pain (or temp greater than 100.4).    Ascorbic Acid (VITAMIN C) 1000 MG tablet Take 500 mg by mouth daily.     diltiazem (CARDIZEM)  60 MG tablet Take 120 mg by mouth daily as needed (for increased heart rate).    diltiazem (CARTIA XT) 300 MG 24 hr capsule take 1 capsule by mouth once daily    furosemide (LASIX) 20 MG tablet Take 20 mg by mouth daily.    glucosamine-chondroitin 500-400 MG tablet Take 1 tablet by mouth 3 (three) times daily.   Associated Diagnoses: Cancer of endometrium ([place])    Multiple Vitamin (MULTI-VITAMINS) TABS Take 1 tablet by mouth daily.     simvastatin (ZOCOR) 20 MG tablet Take 20 mg by mouth at bedtime.    warfarin (COUMADIN) 4 MG tablet take 1 tablet by mouth once daily      STOP taking these medications     benazepril (LOTENSIN) 20 MG tablet          DISCHARGE INSTRUCTIONS:    follow-up Dr. Rehabilitation one day  If you experience worsening of your admission symptoms, develop shortness of breath, life threatening emergency, suicidal or homicidal thoughts you must seek medical attention immediately by calling 911 or calling your MD immediately  if symptoms less severe.  You Must read complete instructions/literature along with all the possible adverse reactions/side effects for all the Medicines you take and that have been prescribed to you. Take any new Medicines after you have completely understood and accept all the possible adverse reactions/side effects.   Please note  You were cared for by a hospitalist during your hospital stay. If you have any questions about your discharge medications or the care you received while you were in the hospital after you are discharged, you can call the unit and asked to speak with the hospitalist on call if the hospitalist that took care of you is not available. Once you are discharged, your primary care physician will handle any further medical issues. Please note that NO REFILLS for any discharge medications will be authorized once you are discharged, as it is imperative that you return to your primary care physician (or establish a  relationship with a primary care physician if you do not have one) for your aftercare needs so that they can reassess your need for medications and monitor your lab values.    Today   CHIEF COMPLAINT:   Chief Complaint  Patient presents with  . Abdominal Pain    HISTORY OF PRESENT ILLNESS:  Maeven Mcdougall  is a 83 y.o. female with a known history of presented with abdominal pain and nausea vomiting   VITAL SIGNS:  Blood pressure 106/64, pulse (!) 108, temperature (!) 97.5 F (36.4 C), resp. rate 17, height 5' 2.5" (1.588 m), weight 60.6 kg (133 lb 9.6 oz), SpO2 95 %.    PHYSICAL EXAMINATION:  GENERAL:  83 y.o.-year-old patient lying in the bed with no acute distress.  EYES: Pupils equal, round, reactive to light and accommodation. No scleral icterus. Extraocular muscles intact.  HEENT: Head atraumatic, normocephalic. Oropharynx and nasopharynx clear.  NECK:  Supple, no jugular venous distention. No thyroid enlargement, no tenderness.  LUNGS: Normal breath sounds bilaterally, no wheezing, rales,rhonchi or crepitation. No use of accessory muscles of respiration.  CARDIOVASCULAR: S1, S2 regular regular. systolic murmurs. o rubs, or gallops.  ABDOMEN: Soft, non-tender, non-distended. Bowel sounds present. No organomegaly or mass.  EXTREMITIES: No edema, cyanosis, or clubbing.  NEUROLOGIC: Cranial nerves II through XII are intact. Muscle strength 5/5 in all extremities. Sensation intact. Gait not checked.  PSYCHIATRIC: The patient is alert and oriented x 3.  SKIN: No obvious rash, lesion, or ulcer.   DATA REVIEW:   CBC  Recent Labs Lab 06/05/17 0359  WBC 7.8  HGB 11.5*  HCT 33.0*  PLT 133*    Chemistries   Recent Labs Lab 06/01/17 0512  06/04/17 0357  NA 140  < > 141  K 5.1  < > 4.3  CL 110  < > 109  CO2 24  < > 27  GLUCOSE 195*  < > 116*  BUN 26*  < > 22*  CREATININE 0.86  < > 0.76  CALCIUM 8.3*  < > 8.4*  MG 2.1  < > 1.6*  AST 22  --   --   ALT 13*  --    --   ALKPHOS 55  --   --   BILITOT 0.6  --   --   < > = values in this interval not displayed.   RADIOLOGY:  Mr 3d Recon At Scanner  Result Date: 06/04/2017 CLINICAL DATA:  Duodenal obstruction. EXAM: MRI ABDOMEN WITHOUT AND WITH CONTRAST (INCLUDING MRCP) TECHNIQUE: Multiplanar multisequence MR imaging of the abdomen was performed both before and after the administration of intravenous contrast. Heavily T2-weighted images of the biliary and pancreatic ducts were obtained, and three-dimensional MRCP images were rendered by post processing. CONTRAST:  20mL MULTIHANCE GADOBENATE DIMEGLUMINE 529 MG/ML IV SOLN COMPARISON:  CT scan 05/28/2017 FINDINGS: Lower chest:  Heart is enlarged. Hepatobiliary: Scattered tiny T2 hyperintense foci are noted in the liver. These are not well seen after IV contrast administration on T1 weighted imaging due to patient motion. Gallbladder is distended with tiny layering gallstones evident. No intra or extrahepatic biliary duct dilatation. Extrahepatic common duct is upper normal to borderline increased at 6-7 mm diameter. Common bile duct in the head of the pancreas is 5 mm diameter. No evidence for choledocholithiasis. Pancreas: 6 mm cystic lesion in the body the pancreas has simple features on T2 weighted imaging but is not fully assess given the motion degradation on postcontrast T1 weighted imaging. Spleen: No gross abnormality. Adrenals/Urinary Tract: No adrenal nodule or mass. Similar appearance left hydronephrosis. No right hydronephrosis. Tiny bilateral T2 hyperintense renal lesions are likely cysts. Postcontrast imaging of the kidneys is markedly degraded by motion artifact. Stomach/Bowel: Stomach is distended although less so than on the prior CT scan. Duodenum distension persists. There is wall thickening in the transverse portion of the duodenum, not well seen on postcontrast imaging due to motion artifact. There may be some hyperenhancement in the anterior wall of the  duodenum just proximal to the area of wall thickening, but given the motion artifact, this is not a definite finding. Vascular/Lymphatic: No abdominal aortic aneurysm. No evidence for abdominal lymphadenopathy on precontrast imaging. Other: No substantial intraperitoneal free fluid. Musculoskeletal: Thoracolumbar scoliosis evident. IMPRESSION: 1. Limited study secondary to motion artifact from patient inability to reproducibly breath hold. 2. Gastric distention is less prominent than on the recent CT scan with persistent wall thickening noted  in the transverse duodenum. 3. Probable tiny hepatic and renal cysts, incompletely characterized. 4. Similar appearance of left-sided hydroureteronephrosis. 5. Cholelithiasis without evidence for choledocholithiasis. Electronically Signed   By: Misty Stanley M.D.   On: 06/04/2017 20:17   Mr Abdomen Mrcp Moise Boring Contast  Result Date: 06/04/2017 CLINICAL DATA:  Duodenal obstruction. EXAM: MRI ABDOMEN WITHOUT AND WITH CONTRAST (INCLUDING MRCP) TECHNIQUE: Multiplanar multisequence MR imaging of the abdomen was performed both before and after the administration of intravenous contrast. Heavily T2-weighted images of the biliary and pancreatic ducts were obtained, and three-dimensional MRCP images were rendered by post processing. CONTRAST:  34mL MULTIHANCE GADOBENATE DIMEGLUMINE 529 MG/ML IV SOLN COMPARISON:  CT scan 05/28/2017 FINDINGS: Lower chest:  Heart is enlarged. Hepatobiliary: Scattered tiny T2 hyperintense foci are noted in the liver. These are not well seen after IV contrast administration on T1 weighted imaging due to patient motion. Gallbladder is distended with tiny layering gallstones evident. No intra or extrahepatic biliary duct dilatation. Extrahepatic common duct is upper normal to borderline increased at 6-7 mm diameter. Common bile duct in the head of the pancreas is 5 mm diameter. No evidence for choledocholithiasis. Pancreas: 6 mm cystic lesion in the body the  pancreas has simple features on T2 weighted imaging but is not fully assess given the motion degradation on postcontrast T1 weighted imaging. Spleen: No gross abnormality. Adrenals/Urinary Tract: No adrenal nodule or mass. Similar appearance left hydronephrosis. No right hydronephrosis. Tiny bilateral T2 hyperintense renal lesions are likely cysts. Postcontrast imaging of the kidneys is markedly degraded by motion artifact. Stomach/Bowel: Stomach is distended although less so than on the prior CT scan. Duodenum distension persists. There is wall thickening in the transverse portion of the duodenum, not well seen on postcontrast imaging due to motion artifact. There may be some hyperenhancement in the anterior wall of the duodenum just proximal to the area of wall thickening, but given the motion artifact, this is not a definite finding. Vascular/Lymphatic: No abdominal aortic aneurysm. No evidence for abdominal lymphadenopathy on precontrast imaging. Other: No substantial intraperitoneal free fluid. Musculoskeletal: Thoracolumbar scoliosis evident. IMPRESSION: 1. Limited study secondary to motion artifact from patient inability to reproducibly breath hold. 2. Gastric distention is less prominent than on the recent CT scan with persistent wall thickening noted in the transverse duodenum. 3. Probable tiny hepatic and renal cysts, incompletely characterized. 4. Similar appearance of left-sided hydroureteronephrosis. 5. Cholelithiasis without evidence for choledocholithiasis. Electronically Signed   By: Misty Stanley M.D.   On: 06/04/2017 20:17     Management plans discussed with the patient, family and they are in agreement.  CODE STATUS:     Code Status Orders        Start     Ordered   05/28/17 1732  Full code  Continuous     05/28/17 1732    Code Status History    Date Active Date Inactive Code Status Order ID Comments User Context   This patient has a current code status but no historical code  status.    Advance Directive Documentation     Most Recent Value  Type of Advance Directive  Healthcare Power of Attorney  Pre-existing out of facility DNR order (yellow form or pink MOST form)  -  "MOST" Form in Place?  -      TOTAL TIME TAKING CARE OF THIS PATIENT: 35 minutes.    Loletha Grayer M.D on 06/05/2017 at 2:15 PM  Between 7am to 6pm - Pager - [phone]  After 6pm go to [email or web address] - password [email or web address]  [facility]  [phone]  CC: Primary care physician; [name], MD

## 2017-06-05 NOTE — Clinical Social Work Note (Signed)
Insurance authorization has been approved for patient to go to [facility]  CSW updated patient and her family.  [facility] said they can accept patient if she is medically ready for discharge over the weekend.  CSW to continue to follow patient's progress throughout discharge planning.  Jones Broom. [place], MSW, [place]  06/05/2017 5:54 PM

## 2017-06-05 NOTE — Care Management Important Message (Signed)
Important Message  Patient Details  Name: Crystal Landry MRN: [id number] Date of Birth: [birth year]   Medicare Important Message Given:  Yes Signed IM notice given to patient and her son Richardson Landry.  It is possible she could discharge over the weekend    Katrina Stack, [place] 06/05/2017, 9:59 AM

## 2017-06-05 NOTE — Care Management (Signed)
Barrier to discharge- INR is not therapeutic.  Spoke with son Remo Lipps and agreeable to heads up referral to [facility] (SN PT Aide ) IF SNF is not approved by insurance.  Also agreeable to have this agency "follow along" if discharges to SNF for possible need of services when discharged from skilled nursing to home.  Updated attending that there may be a need for peer review for SNF approval

## 2017-06-05 NOTE — Progress Notes (Signed)
[name] MD, MRCP(U.K) [address]  [place]  [place], [place] [address]  Main: [id number]    CALINDA STOCKINGER is being followed for duodenal obstruction   Subjective: Doing well ,eating well no complaints   Objective: Vital signs in last 24 hours: Vitals:   06/04/17 1254 06/04/17 1940 06/05/17 0429 06/05/17 0500  BP: 112/66 (!) 104/46 (!) 119/59   Pulse: 89 91 70   Resp: 18     Temp: 97.6 F (36.4 C) 98.4 F (36.9 C) 98 F (36.7 C)   TempSrc:  Oral Oral   SpO2: 98% 93% 96%   Weight:    133 lb 9.6 oz (60.6 kg)  Height:       Weight change: -2 lb 9.6 oz (-1.179 kg)  Intake/Output Summary (Last 24 hours) at 06/05/17 1149 Last data filed at 06/05/17 1000  Gross per 24 hour  Intake           331.17 ml  Output             1400 ml  Net         -1068.83 ml     Exam: Heart:: Regular rate and rhythm, S1S2 present or without murmur or extra heart sounds Lungs: normal, clear to auscultation and clear to auscultation and percussion Abdomen: soft, nontender, normal bowel sounds   Lab Results: @LABTEST2 @ Micro Results: Recent Results (from the past 240 hour(s))  Urine culture     Status: None   Collection Time: 05/28/17  3:48 PM  Result Value Ref Range Status   Specimen Description URINE, CLEAN CATCH  Final   Special Requests Immunocompromised  Final   Culture   Final    NO GROWTH Performed at [facility], [address] [place], [place] [address]    Report Status 05/30/2017 FINAL  Final   Studies/Results: Mr 3d Recon At Scanner  Result Date: 06/04/2017 CLINICAL DATA:  Duodenal obstruction. EXAM: MRI ABDOMEN WITHOUT AND WITH CONTRAST (INCLUDING MRCP) TECHNIQUE: Multiplanar multisequence MR imaging of the abdomen was performed both before and after the administration of intravenous contrast. Heavily T2-weighted images of the biliary and pancreatic ducts were obtained, and three-dimensional MRCP images were rendered by post processing. CONTRAST:   11mL MULTIHANCE GADOBENATE DIMEGLUMINE 529 MG/ML IV SOLN COMPARISON:  CT scan 05/28/2017 FINDINGS: Lower chest:  Heart is enlarged. Hepatobiliary: Scattered tiny T2 hyperintense foci are noted in the liver. These are not well seen after IV contrast administration on T1 weighted imaging due to patient motion. Gallbladder is distended with tiny layering gallstones evident. No intra or extrahepatic biliary duct dilatation. Extrahepatic common duct is upper normal to borderline increased at 6-7 mm diameter. Common bile duct in the head of the pancreas is 5 mm diameter. No evidence for choledocholithiasis. Pancreas: 6 mm cystic lesion in the body the pancreas has simple features on T2 weighted imaging but is not fully assess given the motion degradation on postcontrast T1 weighted imaging. Spleen: No gross abnormality. Adrenals/Urinary Tract: No adrenal nodule or mass. Similar appearance left hydronephrosis. No right hydronephrosis. Tiny bilateral T2 hyperintense renal lesions are likely cysts. Postcontrast imaging of the kidneys is markedly degraded by motion artifact. Stomach/Bowel: Stomach is distended although less so than on the prior CT scan. Duodenum distension persists. There is wall thickening in the transverse portion of the duodenum, not well seen on postcontrast imaging due to motion artifact. There may be some hyperenhancement in the anterior wall of the duodenum just proximal to  the area of wall thickening, but given the motion artifact, this is not a definite finding. Vascular/Lymphatic: No abdominal aortic aneurysm. No evidence for abdominal lymphadenopathy on precontrast imaging. Other: No substantial intraperitoneal free fluid. Musculoskeletal: Thoracolumbar scoliosis evident. IMPRESSION: 1. Limited study secondary to motion artifact from patient inability to reproducibly breath hold. 2. Gastric distention is less prominent than on the recent CT scan with persistent wall thickening noted in the  transverse duodenum. 3. Probable tiny hepatic and renal cysts, incompletely characterized. 4. Similar appearance of left-sided hydroureteronephrosis. 5. Cholelithiasis without evidence for choledocholithiasis. Electronically Signed   By: Misty Stanley M.D.   On: 06/04/2017 20:17   Mr Abdomen Mrcp Moise Boring Contast  Result Date: 06/04/2017 CLINICAL DATA:  Duodenal obstruction. EXAM: MRI ABDOMEN WITHOUT AND WITH CONTRAST (INCLUDING MRCP) TECHNIQUE: Multiplanar multisequence MR imaging of the abdomen was performed both before and after the administration of intravenous contrast. Heavily T2-weighted images of the biliary and pancreatic ducts were obtained, and three-dimensional MRCP images were rendered by post processing. CONTRAST:  16mL MULTIHANCE GADOBENATE DIMEGLUMINE 529 MG/ML IV SOLN COMPARISON:  CT scan 05/28/2017 FINDINGS: Lower chest:  Heart is enlarged. Hepatobiliary: Scattered tiny T2 hyperintense foci are noted in the liver. These are not well seen after IV contrast administration on T1 weighted imaging due to patient motion. Gallbladder is distended with tiny layering gallstones evident. No intra or extrahepatic biliary duct dilatation. Extrahepatic common duct is upper normal to borderline increased at 6-7 mm diameter. Common bile duct in the head of the pancreas is 5 mm diameter. No evidence for choledocholithiasis. Pancreas: 6 mm cystic lesion in the body the pancreas has simple features on T2 weighted imaging but is not fully assess given the motion degradation on postcontrast T1 weighted imaging. Spleen: No gross abnormality. Adrenals/Urinary Tract: No adrenal nodule or mass. Similar appearance left hydronephrosis. No right hydronephrosis. Tiny bilateral T2 hyperintense renal lesions are likely cysts. Postcontrast imaging of the kidneys is markedly degraded by motion artifact. Stomach/Bowel: Stomach is distended although less so than on the prior CT scan. Duodenum distension persists. There is wall  thickening in the transverse portion of the duodenum, not well seen on postcontrast imaging due to motion artifact. There may be some hyperenhancement in the anterior wall of the duodenum just proximal to the area of wall thickening, but given the motion artifact, this is not a definite finding. Vascular/Lymphatic: No abdominal aortic aneurysm. No evidence for abdominal lymphadenopathy on precontrast imaging. Other: No substantial intraperitoneal free fluid. Musculoskeletal: Thoracolumbar scoliosis evident. IMPRESSION: 1. Limited study secondary to motion artifact from patient inability to reproducibly breath hold. 2. Gastric distention is less prominent than on the recent CT scan with persistent wall thickening noted in the transverse duodenum. 3. Probable tiny hepatic and renal cysts, incompletely characterized. 4. Similar appearance of left-sided hydroureteronephrosis. 5. Cholelithiasis without evidence for choledocholithiasis. Electronically Signed   By: Misty Stanley M.D.   On: 06/04/2017 20:17   Medications: I have reviewed the patient's current medications. Scheduled Meds: . diltiazem  300 mg Oral Daily  . docusate sodium  100 mg Oral BID  . feeding supplement (ENSURE ENLIVE)  237 mL Oral BID BM  . oxybutynin  5 mg Oral QHS  . pantoprazole (PROTONIX) IV  40 mg Intravenous Q12H  . simvastatin  20 mg Oral q1800  . sodium chloride flush  10-40 mL Intracatheter Q12H  . traZODone  25 mg Oral Once  . warfarin  8 mg Oral ONCE-1800  . Warfarin - Pharmacist  Dosing Inpatient   Does not apply q1800   Continuous Infusions: . heparin 750 Units/hr (06/05/17 1000)   PRN Meds:.acetaminophen **OR** acetaminophen, lidocaine, LORazepam, ondansetron **OR** ondansetron (ZOFRAN) IV, sodium chloride flush, temazepam   Assessment: Active Problems:   Atrial fibrillation with RVR ([place])   Encounter for imaging study to confirm nasogastric (NG) tube placement  Eureka Valdes Stewartis a 83 y.o.y/o femaleadmitted  with nausea, vomiting and diarrhea. CT scan concerning for duodenal obstruction.EGD with severely edematous distal stomach of unclear etiology with a functional obstruction. Duodenal inflammation in 3rd portion of the duodenum with mild narrowing but no obstruction. No definite mass seen. Biopsies negative for malignancy .On TPN.  Plan  1. oral diet as tolerated can be advanced 2. PPI 3. Since MRCP shows no clear obstruction at this time, advance diet , there is some non specific persistent duodenal inflammation . I would suggest repeat CT scan of the abdomen in 4-6 weeks to check for resolution . Given her my business card to see me in the oupatient in a few weeks 4. Check stool for H pylori   I will sign off.  Please call me if any further GI concerns or questions.  We would like to thank you for the opportunity to participate in the care of ALEGRA ROST.      LOS: 8 days   [name] 06/05/2017, 11:49 AM

## 2017-06-06 LAB — CBC
HCT: 33.8 % — ABNORMAL LOW (ref 35.0–47.0)
HEMOGLOBIN: 11.7 g/dL — AB (ref 12.0–16.0)
MCH: 33.8 pg (ref 26.0–34.0)
MCHC: 34.7 g/dL (ref 32.0–36.0)
MCV: 97.4 fL (ref 80.0–100.0)
Platelets: 166 10*3/uL (ref 150–440)
RBC: 3.47 MIL/uL — AB (ref 3.80–5.20)
RDW: 14.6 % — ABNORMAL HIGH (ref 11.5–14.5)
WBC: 8.6 10*3/uL (ref 3.6–11.0)

## 2017-06-06 LAB — PROTIME-INR
INR: 1.96
Prothrombin Time: 22.2 seconds — ABNORMAL HIGH (ref 11.4–15.2)

## 2017-06-06 LAB — HEPARIN LEVEL (UNFRACTIONATED): Heparin Unfractionated: 0.44 IU/mL (ref 0.30–0.70)

## 2017-06-06 MED ORDER — METOPROLOL TARTRATE 5 MG/5ML IV SOLN: 5.0000 mg | Freq: Four times a day (QID) | INTRAVENOUS | Status: DC | PRN

## 2017-06-06 MED ORDER — WARFARIN SODIUM 5 MG PO TABS
5.0000 mg | ORAL_TABLET | Freq: Once | ORAL | Status: AC
  Administered 2017-06-06: 5 mg via ORAL
  Filled 2017-06-06: qty 1

## 2017-06-06 MED ORDER — ALUM & MAG HYDROXIDE-SIMETH 200-200-20 MG/5ML PO SUSP
30.0000 mL | Freq: Four times a day (QID) | ORAL | Status: DC | PRN
  Administered 2017-06-06 – 2017-06-08 (×2): 30 mL via ORAL
  Filled 2017-06-06 (×2): qty 30

## 2017-06-06 MED ORDER — SUCRALFATE 1 GM/10ML PO SUSP
1.0000 g | Freq: Three times a day (TID) | ORAL | Status: DC
  Administered 2017-06-06 – 2017-06-11 (×20): 1 g via ORAL
  Filled 2017-06-06 (×22): qty 10

## 2017-06-06 NOTE — Progress Notes (Signed)
Patient ID: Crystal Landry, female   DOB: [birth year], 83 y.o.   MRN: [id number]    [place] PROGRESS NOTE  Crystal Landry ASN:[id number] DOB: 09/11/1927 DOA: 05/28/2017 PCP: [name], MD  HPI/Subjective: Feeling little better had a bowel movement. INR is subtherapeutic  Objective: Vitals:   06/06/17 0813 06/06/17 1259  BP: 114/61 126/78  Pulse: 87 78  Resp: 18 18  Temp: 98.1 F (36.7 C) 97.7 F (36.5 C)  SpO2: 95% 98%    Filed Weights   06/04/17 0341 06/05/17 0500 06/06/17 0458  Weight: 136 lb 3.2 oz (61.8 kg) 133 lb 9.6 oz (60.6 kg) 129 lb 4.8 oz (58.7 kg)    ROS: Review of Systems  Constitutional: Negative for chills and fever.  Eyes: Negative for blurred vision.  Respiratory: Negative for cough and shortness of breath.   Cardiovascular: Negative for chest pain.  Gastrointestinal: Negative for abdominal pain, constipation, diarrhea, nausea and vomiting.  Genitourinary: Negative for dysuria.  Musculoskeletal: Negative for joint pain.  Neurological: Negative for dizziness and headaches.   Exam: Physical Exam  Constitutional: She is oriented to person, place, and time.  HENT:  Nose: No mucosal edema.  Mouth/Throat: No oropharyngeal exudate or posterior oropharyngeal edema.  Eyes: Pupils are equal, round, and reactive to light. Conjunctivae, EOM and lids are normal.  Neck: No JVD present. Carotid bruit is not present. No edema present. No thyroid mass and no thyromegaly present.  Cardiovascular: S1 normal and S2 normal.  Exam reveals no gallop.   Murmur heard.  Systolic murmur is present with a grade of 4/6  Pulses:      Dorsalis pedis pulses are 2+ on the right side, and 2+ on the left side.  Metallic heart valve  Respiratory: No respiratory distress. She has no wheezes. She has no rhonchi. She has no rales.  GI: Soft. Bowel sounds are normal. There is no tenderness.  Musculoskeletal:       Right shoulder: She exhibits no swelling.   Lymphadenopathy:    She has no cervical adenopathy.  Neurological: She is alert and oriented to person, place, and time. No cranial nerve deficit.  Skin: Skin is warm. No rash noted. Nails show no clubbing.  Psychiatric: She has a normal mood and affect.      Data Reviewed: Basic Metabolic Panel:  Recent Labs Lab 05/31/17 0457 06/01/17 0512 06/01/17 2356 06/03/17 0454 06/04/17 0357  NA 143 140 138 142 141  K 4.5 5.1 4.1 3.9 4.3  CL 111 110 112* 111 109  CO2 22 24 22 25 27   GLUCOSE 64* 195* 141* 100* 116*  BUN 21* 26* 23* 17 22*  CREATININE 0.87 0.86 0.79 0.74 0.76  CALCIUM 8.2* 8.3* 8.0* 8.3* 8.4*  MG 2.0 2.1 1.9  --  1.6*  PHOS 2.7 2.4* 1.7* 3.1 2.9   Liver Function Tests:  Recent Labs Lab 06/01/17 0512  AST 22  ALT 13*  ALKPHOS 55  BILITOT 0.6  PROT 5.8*  ALBUMIN 3.1*   CBC:  Recent Labs Lab 06/01/17 0512 06/01/17 2356 06/02/17 1116 06/04/17 0357 06/05/17 0359 06/06/17 0626  WBC 7.8 12.4* 9.5 6.9 7.8 8.6  NEUTROABS 7.2*  --   --   --   --   --   HGB 11.3* 10.8* 11.2* 11.5* 11.5* 11.7*  HCT 33.2* 31.9* 33.9* 33.8* 33.0* 33.8*  MCV 96.5 96.7 98.9 98.3 97.5 97.4  PLT 162 172 176 155 133* 166      Scheduled Meds: .  diltiazem  300 mg Oral Daily  . docusate sodium  100 mg Oral BID  . feeding supplement (ENSURE ENLIVE)  237 mL Oral BID BM  . oxybutynin  5 mg Oral QHS  . pantoprazole (PROTONIX) IV  40 mg Intravenous Q12H  . simvastatin  20 mg Oral q1800  . sodium chloride flush  10-40 mL Intracatheter Q12H  . traZODone  25 mg Oral Once  . warfarin  5 mg Oral ONCE-1800  . Warfarin - Pharmacist Dosing Inpatient   Does not apply q1800   Continuous Infusions: . heparin 750 Units/hr (06/05/17 1000)    Assessment/Plan:  1. Functional Bowel obstruction. Severe inflammation and esophagus stomach and duodenum.  Continue soft diet.  Biopsy results are negative. MRI did not show any mass. It did show a 6 mm cystic lesion on the pancreas which was  limited with motion.not improve 2. Atrial fibrillation.  Continue Cardizem CD 300 mg daily. Coumadin level low today. Pharmacy dosing Coumadin.  Heparin drip restarted yesterday INR still subtherapeutic 3. Acute kidney injury. Improved 4. Left-sided hydronephrosis and hydroureter. Urology on call on admission recommended outpatient follow-up.  5. Hyperlipidemia unspecified. Restart simvastatin 6. Metallic heart valve. Coumadin dosing by pharmacy and heparin drip while INR low. 7. Constipation. continued Colace 8. Urinary frequency at night and the Ditropan last night.  Code Status:     Code Status Orders        Start     Ordered   05/28/17 1732  Full code  Continuous     05/28/17 1732    Code Status History    Date Active Date Inactive Code Status Order ID Comments User Context   This patient has a current code status but no historical code status.    Advance Directive Documentation     Most Recent Value  Type of Advance Directive  Healthcare Power of Attorney  Pre-existing out of facility DNR order (yellow form or pink MOST form)  -  "MOST" Form in Place?  -     Family Communication: Son at the bedside Disposition Plan: likely will need rehabilitation for weakness.  Consultants:  Gastroenterology  Surgery  Cardiology  Time spent: 25 minutes  [place], [facility]

## 2017-06-06 NOTE — Progress Notes (Signed)
Notified Dr. Posey Pronto about patient's HR in 140's-160's with activities, patient denies chest pain, dizziness or lightheadedness. Order given for PRN Metoprolol 5mg  IV if HR is greater than 106. Patient was nauseous, gave PRN Zofran. PT was going to work with her but will postpone for now until HR is stable. No other concern at the moment. RN will continue to monitor.

## 2017-06-06 NOTE — Progress Notes (Addendum)
[place] for Warfarin and heparin Drip  Indication: atrial fibrillation  Allergies  Allergen Reactions  . Codeine Sulfate     Other reaction(s): Unknown  . Diphenhydramine Other (See Comments)    skin became red all over  . Ivp Dye [Iodinated Diagnostic Agents] Other (See Comments)    Skin becomes red all over  . Quinidine     Other reaction(s): Unknown Other reaction(s): Unknown    Patient Measurements: Height: 5' 2.5" (158.8 cm) Weight: 129 lb 4.8 oz (58.7 kg) IBW/kg (Calculated) : 51.25  Heparin DW: 60 kg  Vital Signs: Temp: 97.6 F (36.4 C) (09/08 0458) Temp Source: Oral (09/08 0458) BP: 103/58 (09/08 0458) Pulse Rate: 84 (09/08 0458)  Labs:  Recent Labs  06/04/17 0357  06/04/17 2100 06/05/17 0359 06/06/17 0626  HGB 11.5*  --   --  11.5* 11.7*  HCT 33.8*  --   --  33.0* 33.8*  PLT 155  --   --  133* 166  LABPROT 19.2*  --   --  18.5* 22.2*  INR 1.63  --   --  1.56 1.96  HEPARINUNFRC 0.29*  < > 0.34 0.43 0.44  CREATININE 0.76  --   --   --   --   < > = values in this interval not displayed.  Estimated Creatinine Clearance: 40.1 mL/min (by C-G formula based on SCr of 0.76 mg/dL).   Medical History: Past Medical History:  Diagnosis Date  . A-fib ([place])   . Anxiety   . Atrial fibrillation ([place])   . Cancer (HCC)    endometrial  . Depression   . Hypertension    Assessment: 83 y/o F with a known h/o atrial fibrillation and mechnical mitral valve on warfarin 4 mg daily PTA admitted with abdominal pain as well as intractable N/V. Pt warfarin was held and was given vit K to reduce INR for endoscopy.  Date INR Dose  8/30 2.62 HELD 8/31 3.1   HELD 9/1 3.96   HELD- Vit K 2.5mg  IV given bc pt needed procedure --> INR 1.51 9/2       1.47  6mg  9/3 1.44 HELD- patient may have received a 2nd dose on 9/2 9/4       2.88 HELD 9/5 1.83 6mg   9/6  1.63 6mg  9/7 1.56 8mg   9/8 1.96 5mg    Goal of Therapy:  INR 2.5-3.5  Plan  1 Warfarin:  INR still subtherapeutic this morning. Unsure why INR is trending down. Patients home regimen is 4mg  daily- Will order warfarin 8mg  x 1 dose tonight.   Will follow daily INR levels and order doses based on trend.   Plan 2 Heparin: INR subtherapeutic- Heparin drip reordered until INR therapeutic and stable Will start with Heparin 3000u Bolus Start infusion of Heparin 600u/hr Check Anti-Xa level in 8 hours.   9/5: HL@1929 = 0.30.  Will slightly increase Heparin drip to 650 units/hr. Will f/u next Heparin level in 8 hours.  09/06 0400 heparin level 0.29. 900 unit bolus and increase rate to 750 units/hr. Recheck in 8 hours.  9/6 1348 Heparin level 0.37. Continue current rate of 750units/hr. Recheck level in 6 hours. CBC with AM labs per protocol  9/6@2211  Heparin level 0.34, will check with am labs and continue current rate of 750units/hr.   09/07 AM heparin level 0.43. Continue current regimen. Recheck CBC/HL with tomorrow AM labs.  09/08 AM heparin level 0.44. Continue current regimen. Recheck CBC/HL with tomorrow AM  labs.  Eloise Harman, PharmD, BCPS Clinical Pharmacist 06/06/2017 7:13 AM

## 2017-06-06 NOTE — Progress Notes (Signed)
OT Cancellation Note  Patient Details Name: Crystal Landry MRN: [id number] DOB: January 22, 1927   Cancelled Treatment:    Reason Eval/Treat Not Completed: Patient at procedure or test/ unavailable. NSG in with pt removing picc line. Sons outside pt's room. Spoke with family and pt. Will re-attempt at later date/time as schedule allows and as pt is available for OT treatment.  Jeni Salles, MPH, MS, OTR/L ascom [phone] 06/06/17, 11:44 AM

## 2017-06-06 NOTE — Progress Notes (Signed)
PT Cancellation Note  Patient Details Name: Crystal Landry MRN: [id number] DOB: [birth year]   Cancelled Treatment:    Reason Eval/Treat Not Completed: Medical issues which prohibited therapy; Pt tachy with HR bouncing between 130's-160's with supine to sit transfer.  Pt also just vomited prior to PT entering room.  Hold PT session this date per RN.  Pax Reasoner A Micalah Cabezas, PT 06/06/2017, 3:26 PM

## 2017-06-06 NOTE — Progress Notes (Signed)
Talked to Dr. Posey Pronto about patient's PICC line not being use, order to discontinue now. No other concern at the moment. RN will continue to monitor.

## 2017-06-07 LAB — CBC
HEMATOCRIT: 34.2 % — AB (ref 35.0–47.0)
HEMOGLOBIN: 11.6 g/dL — AB (ref 12.0–16.0)
MCH: 32.6 pg (ref 26.0–34.0)
MCHC: 33.9 g/dL (ref 32.0–36.0)
MCV: 96 fL (ref 80.0–100.0)
Platelets: 174 10*3/uL (ref 150–440)
RBC: 3.56 MIL/uL — AB (ref 3.80–5.20)
RDW: 14.7 % — ABNORMAL HIGH (ref 11.5–14.5)
WBC: 9.7 10*3/uL (ref 3.6–11.0)

## 2017-06-07 LAB — PROTIME-INR
INR: 2.45
Prothrombin Time: 26.4 seconds — ABNORMAL HIGH (ref 11.4–15.2)

## 2017-06-07 LAB — HEPARIN LEVEL (UNFRACTIONATED): Heparin Unfractionated: 0.38 IU/mL (ref 0.30–0.70)

## 2017-06-07 MED ORDER — DILTIAZEM HCL 30 MG PO TABS
60.0000 mg | ORAL_TABLET | Freq: Three times a day (TID) | ORAL | Status: DC
  Administered 2017-06-07 – 2017-06-11 (×14): 60 mg via ORAL
  Filled 2017-06-07 (×15): qty 2

## 2017-06-07 MED ORDER — SIMETHICONE 80 MG PO CHEW
80.0000 mg | CHEWABLE_TABLET | Freq: Four times a day (QID) | ORAL | Status: DC
  Administered 2017-06-07 – 2017-06-11 (×19): 80 mg via ORAL
  Filled 2017-06-07 (×22): qty 1

## 2017-06-07 MED ORDER — WARFARIN SODIUM 2 MG PO TABS
2.0000 mg | ORAL_TABLET | Freq: Once | ORAL | Status: AC
  Administered 2017-06-07: 2 mg via ORAL
  Filled 2017-06-07: qty 1

## 2017-06-07 MED ORDER — METOPROLOL TARTRATE 25 MG PO TABS: 25.0000 mg | ORAL_TABLET | Freq: Two times a day (BID) | ORAL | Status: DC

## 2017-06-07 NOTE — Progress Notes (Signed)
Patient ID: Crystal Landry, female   DOB: November 22, 1926, 83 y.o.   MRN: [id number]    [facility] PROGRESS NOTE  Crystal Landry RXV:[id number] DOB: April 29, 1927 DOA: 05/28/2017 PCP: Crystal Harrier, MD  HPI/Subjective: Patient's continues to have intermittent abdominal discomfort Complains of gas INR is therapeutic Every time she ambulates heart rate goes up  Objective: Vitals:   06/07/17 0815 06/07/17 1136  BP: 122/69 (!) 126/58  Pulse: (!) 104 (!) 53  Resp: 18 19  Temp: 97.6 F (36.4 C) 98.2 F (36.8 C)  SpO2: 97% 97%    Filed Weights   06/05/17 0500 06/06/17 0458 06/07/17 0503  Weight: 133 lb 9.6 oz (60.6 kg) 129 lb 4.8 oz (58.7 kg) 126 lb 4.8 oz (57.3 kg)    ROS: Review of Systems  Constitutional: Negative for chills and fever.  Eyes: Negative for blurred vision.  Respiratory: Negative for cough and shortness of breath.   Cardiovascular: Negative for chest pain.  Gastrointestinal: Negative for abdominal pain, constipation, diarrhea, nausea and vomiting.  Genitourinary: Negative for dysuria.  Musculoskeletal: Negative for joint pain.  Neurological: Negative for dizziness and headaches.   Exam: Physical Exam  Constitutional: She is oriented to person, place, and time.  HENT:  Nose: No mucosal edema.  Mouth/Throat: No oropharyngeal exudate or posterior oropharyngeal edema.  Eyes: Pupils are equal, round, and reactive to light. Conjunctivae, EOM and lids are normal.  Neck: No JVD present. Carotid bruit is not present. No edema present. No thyroid mass and no thyromegaly present.  Cardiovascular: S1 normal and S2 normal.  Exam reveals no gallop.   Murmur heard.  Systolic murmur is present with a grade of 4/6  Pulses:      Dorsalis pedis pulses are 2+ on the right side, and 2+ on the left side.  Metallic heart valve  Respiratory: No respiratory distress. She has no wheezes. She has no rhonchi. She has no rales.  GI: Soft. Bowel sounds are normal. There is  no tenderness.  Musculoskeletal:       Right shoulder: She exhibits no swelling.  Lymphadenopathy:    She has no cervical adenopathy.  Neurological: She is alert and oriented to person, place, and time. No cranial nerve deficit.  Skin: Skin is warm. No rash noted. Nails show no clubbing.  Psychiatric: She has a normal mood and affect.      Data Reviewed: Basic Metabolic Panel:  Recent Labs Lab 06/01/17 0512 06/01/17 2356 06/03/17 0454 06/04/17 0357  NA 140 138 142 141  K 5.1 4.1 3.9 4.3  CL 110 112* 111 109  CO2 24 22 25 27   GLUCOSE 195* 141* 100* 116*  BUN 26* 23* 17 22*  CREATININE 0.86 0.79 0.74 0.76  CALCIUM 8.3* 8.0* 8.3* 8.4*  MG 2.1 1.9  --  1.6*  PHOS 2.4* 1.7* 3.1 2.9   Liver Function Tests:  Recent Labs Lab 06/01/17 0512  AST 22  ALT 13*  ALKPHOS 55  BILITOT 0.6  PROT 5.8*  ALBUMIN 3.1*   CBC:  Recent Labs Lab 06/01/17 0512  06/02/17 1116 06/04/17 0357 06/05/17 0359 06/06/17 0626 06/07/17 0440  WBC 7.8  < > 9.5 6.9 7.8 8.6 9.7  NEUTROABS 7.2*  --   --   --   --   --   --   HGB 11.3*  < > 11.2* 11.5* 11.5* 11.7* 11.6*  HCT 33.2*  < > 33.9* 33.8* 33.0* 33.8* 34.2*  MCV 96.5  < > 98.9 98.3 97.5  97.4 96.0  PLT 162  < > 176 155 133* 166 174  < > = values in this interval not displayed.    Scheduled Meds: . diltiazem  300 mg Oral Daily  . diltiazem  60 mg Oral Q8H  . docusate sodium  100 mg Oral BID  . feeding supplement (ENSURE ENLIVE)  237 mL Oral BID BM  . oxybutynin  5 mg Oral QHS  . pantoprazole (PROTONIX) IV  40 mg Intravenous Q12H  . simethicone  80 mg Oral QID  . simvastatin  20 mg Oral q1800  . sodium chloride flush  10-40 mL Intracatheter Q12H  . sucralfate  1 g Oral TID WC & HS  . traZODone  25 mg Oral Once  . Warfarin - Pharmacist Dosing Inpatient   Does not apply q1800   Continuous Infusions:   Assessment/Plan:  1. Functional Bowel obstruction. Severe inflammation and esophagus stomach and duodenum.  Continue soft  diet.  Biopsy results are negative. MRI did not show any mass. It did show a 6 mm cystic lesion on the pancreas which was limited with motion.patient continues to have symptoms continue PPIs twice a day, continue Carafate, I have added simethicone to current regimen 2. Atrial fibrillation with elevated heart rate with activity.  Continue Cardizem CD 300 mg daily. INR therapeutic stop IV heparin, due to poor control heart rate I will have to add short-acting Cardizem to current regimen 3. Acute kidney injury. Improved 4. Left-sided hydronephrosis and hydroureter. Urology on call on admission recommended outpatient follow-up.  5. Hyperlipidemia unspecified. Restart simvastatin 6. Metallic heart valve. Coumadin dosing by pharmacy  7. Constipation. continued Colace 8. Urinary frequency at night and the Ditropan last night.  Code Status:     Code Status Orders        Start     Ordered   05/28/17 1732  Full code  Continuous     05/28/17 1732    Code Status History    Date Active Date Inactive Code Status Order ID Comments User Context   This patient has a current code status but no historical code status.    Advance Directive Documentation     Most Recent Value  Type of Advance Directive  Healthcare Power of Attorney  Pre-existing out of facility DNR order (yellow form or pink MOST form)  -  "MOST" Form in Place?  -     Family Communication: Son at the bedside Disposition Plan: likely will need rehabilitation for weakness.  Consultants:  Gastroenterology  Surgery  Cardiology  Time spent: 25 minutes  [place], [facility]

## 2017-06-07 NOTE — Progress Notes (Signed)
Pt up to chair for 1.5 hrs. Up to br to void then ambulated with walker entire loop. tol well, though heart rate went up to 150's. Recovered readily with rest. Gait very steady. appetite has been poor, but this afternoon she ate portion of mashed potatoes her son brought in. Napped well this afternoon. Mentally alert and oriented

## 2017-06-07 NOTE — Progress Notes (Signed)
[facility]  SUBJECTIVE: Pt with admission for bowel obstruction with history of afib, mechanical aortic valve replacement treated with warfarin as outpatient. She was bridged with heparn and underwent endoscopy showing severe inflammation of esophagus and stomach.  She was placed back on coumadin and has been improving somewhat but still has a baseline afib rate of 115-120 and rates that increase to 140-150 with activity. She is hemodynamically stable both at rest and with activity despite the rapid heart rate. She has been at bed rest essentially for approximately one week. She is on cardizem cd 300 daily at home and takes short actng cardizem for breakthrough arrhythmia. She notes that she is taking an increasing frequency of the breakthrouh doses at home lately.    Vitals:   06/07/17 0503 06/07/17 0508 06/07/17 0815 06/07/17 1136  BP: (!) 126/55  122/69 (!) 126/58  Pulse: (!) 149 86 (!) 104 (!) 53  Resp: 18  18 19   Temp: 98.6 F (37 C)  97.6 F (36.4 C) 98.2 F (36.8 C)  TempSrc: Oral  Oral Oral  SpO2: 95%  97% 97%  Weight: 57.3 kg (126 lb 4.8 oz)     Height:        Intake/Output Summary (Last 24 hours) at 06/07/17 1426 Last data filed at 06/07/17 1245  Gross per 24 hour  Intake                0 ml  Output              500 ml  Net             -500 ml    LABS: Basic Metabolic Panel: No results for input(s): NA, K, CL, CO2, GLUCOSE, BUN, CREATININE, CALCIUM, MG, PHOS in the last 72 hours. Liver Function Tests: No results for input(s): AST, ALT, ALKPHOS, BILITOT, PROT, ALBUMIN in the last 72 hours. No results for input(s): LIPASE, AMYLASE in the last 72 hours. CBC:  Recent Labs  06/06/17 0626 06/07/17 0440  WBC 8.6 9.7  HGB 11.7* 11.6*  HCT 33.8* 34.2*  MCV 97.4 96.0  PLT 166 174   Cardiac Enzymes: No results for input(s): CKTOTAL, CKMB, CKMBINDEX, TROPONINI in the last 72 hours. BNP: Invalid input(s):  POCBNP D-Dimer: No results for input(s): DDIMER in the last 72 hours. Hemoglobin A1C: No results for input(s): HGBA1C in the last 72 hours. Fasting Lipid Panel: No results for input(s): CHOL, HDL, LDLCALC, TRIG, CHOLHDL, LDLDIRECT in the last 72 hours. Thyroid Function Tests: No results for input(s): TSH, T4TOTAL, T3FREE, THYROIDAB in the last 72 hours.  Invalid input(s): FREET3 Anemia Panel: No results for input(s): VITAMINB12, FOLATE, FERRITIN, TIBC, IRON, RETICCTPCT in the last 72 hours.   Physical Exam: Blood pressure (!) 126/58, pulse (!) 53, temperature 98.2 F (36.8 C), temperature source Oral, resp. rate 19, height 5' 2.5" (1.588 m), weight 57.3 kg (126 lb 4.8 oz), SpO2 97 %.   Wt Readings from Last 1 Encounters:  06/07/17 57.3 kg (126 lb 4.8 oz)     General appearance: alert and cooperative Resp: rhonchi bibasilar Cardio: irregularly irregular rhythm GI: abnormal findings:  mild tenderness in the lower abdomen Extremities: extremities normal, atraumatic, no cyanosis or edema Neurologic: Grossly normal  TELEMETRY: Reviewed telemetry pt in afib with variable ventricular response.   ASSESSMENT AND PLAN:  Active Problems:   Atrial fibrillation with RVR (HCC)-will continue with cardizem cd 300 daily and agree with giving  scheduled doses of cardizem 60 q 8 hours and following response for the next 24 hours. I would expect the patients heart rate to go up when she gets up to ambulate due to her deconditioning.I would proceed with physical therapy despite her incerased heart rate as she is asymptomatic with this. Will follow the ventricular response. If it improves without symptomatic bradycardia, will increase her baseline cardizem to 360 daily. She had fatigue when she took metoprolol previously.    Encounter for imaging study to confirm nasogastric (NG) tube placement    Teodoro Spray, MD, [facility] 06/07/2017 2:26 PM

## 2017-06-07 NOTE — Progress Notes (Signed)
[facility] for Warfarin and heparin Drip  Indication: atrial fibrillation  Allergies  Allergen Reactions  . Codeine Sulfate     Other reaction(s): Unknown  . Diphenhydramine Other (See Comments)    skin became red all over  . Ivp Dye [Iodinated Diagnostic Agents] Other (See Comments)    Skin becomes red all over  . Quinidine     Other reaction(s): Unknown Other reaction(s): Unknown    Patient Measurements: Height: 5' 2.5" (158.8 cm) Weight: 126 lb 4.8 oz (57.3 kg) IBW/kg (Calculated) : 51.25  Heparin DW: 60 kg  Vital Signs: Temp: 98.6 F (37 C) (09/09 0503) Temp Source: Oral (09/09 0503) BP: 126/55 (09/09 0503) Pulse Rate: 86 (09/09 0508)  Labs:  Recent Labs  06/05/17 0359 06/06/17 0626 06/07/17 0440  HGB 11.5* 11.7* 11.6*  HCT 33.0* 33.8* 34.2*  PLT 133* 166 174  LABPROT 18.5* 22.2* 26.4*  INR 1.56 1.96 2.45  HEPARINUNFRC 0.43 0.44 0.38    Estimated Creatinine Clearance: 40.1 mL/min (by C-G formula based on SCr of 0.76 mg/dL).   Medical History: Past Medical History:  Diagnosis Date  . A-fib ([place])   . Anxiety   . Atrial fibrillation ([place])   . Cancer (HCC)    endometrial  . Depression   . Hypertension    Assessment: 83 y/o F with a known h/o atrial fibrillation and mechnical mitral valve on warfarin 4 mg daily PTA admitted with abdominal pain as well as intractable N/V. Pt warfarin was held and was given vit K to reduce INR for endoscopy.  Date INR Dose  8/30 2.62 HELD 8/31 3.1   HELD 9/1 3.96   HELD- Vit K 2.5mg  IV given bc pt needed procedure --> INR 1.51 9/2       1.47  6mg  9/3 1.44 HELD- patient may have received a 2nd dose on 9/2 9/4       2.88 HELD 9/5 1.83 6mg   9/6  1.63 6mg  9/7 1.56 8mg   9/8 1.96 5mg  9/9 2.45 2mg     Goal of Therapy:  INR 2.5-3.5  Plan 1 Warfarin:  INR still subtherapeutic this morning. Unsure why INR is trending down. Patients home regimen is 4mg  daily- Will order warfarin  8mg  x 1 dose tonight.   9/9 Still subtherapeutic but INR is beginning to rise quickly -- will give a warfarin 2mg  dose tonight and continue to adjust based on INR trend.  Will follow daily INR levels and order doses based on trend.   Plan 2 Heparin: INR subtherapeutic- Heparin drip reordered until INR therapeutic and stable Will start with Heparin 3000u Bolus Start infusion of Heparin 600u/hr Check Anti-Xa level in 8 hours.   9/5: HL@1929 = 0.30.  Will slightly increase Heparin drip to 650 units/hr. Will f/u next Heparin level in 8 hours.  09/06 0400 heparin level 0.29. 900 unit bolus and increase rate to 750 units/hr. Recheck in 8 hours.  9/6 1348 Heparin level 0.37. Continue current rate of 750units/hr. Recheck level in 6 hours. CBC with AM labs per protocol  9/6@2211  Heparin level 0.34, will check with am labs and continue current rate of 750units/hr.   09/07 AM heparin level 0.43. Continue current regimen. Recheck CBC/HL with tomorrow AM labs.  09/08 AM heparin level 0.44. Continue current regimen. Recheck CBC/HL with tomorrow AM labs.  9/9 @ 0440 HL 0.38 therapeutic. Will continue current rate and will recheck w/ am labs. Will continue to monitor CBC.  Tobie Lords, PharmD, BCPS  Clinical Pharmacist 06/07/2017 5:55 AM

## 2017-06-07 NOTE — Progress Notes (Signed)
This RN upadted patients son, Richardson Landry. Son is demanding that GI be re consulted because "she is not getting any better, she has not taken anything by mouth in days, I will not watch my mother wither away and die". This RN explained the process of re consulting specialist. Patient adamant that GI MD see patient tomorrow. Per son, " I do not care what I have to do but a GI doctor will see my mother tomorrow, if I have to go the GI doctors office and demand that she come see her I will do that. Emotional support given to son. Will continue to monitor.   [place] Sizer M

## 2017-06-08 ENCOUNTER — Inpatient Hospital Stay: Payer: Medicare Other

## 2017-06-08 DIAGNOSIS — R109 Unspecified abdominal pain: Secondary | ICD-10-CM

## 2017-06-08 LAB — CBC
HEMATOCRIT: 34.3 % — AB (ref 35.0–47.0)
HEMOGLOBIN: 11.5 g/dL — AB (ref 12.0–16.0)
MCH: 32.1 pg (ref 26.0–34.0)
MCHC: 33.5 g/dL (ref 32.0–36.0)
MCV: 96.1 fL (ref 80.0–100.0)
Platelets: 180 10*3/uL (ref 150–440)
RBC: 3.57 MIL/uL — ABNORMAL LOW (ref 3.80–5.20)
RDW: 14.6 % — AB (ref 11.5–14.5)
WBC: 8.4 10*3/uL (ref 3.6–11.0)

## 2017-06-08 LAB — GLUCOSE, CAPILLARY: GLUCOSE-CAPILLARY: 101 mg/dL — AB (ref 65–99)

## 2017-06-08 LAB — PROTIME-INR
INR: 2.63
Prothrombin Time: 27.9 seconds — ABNORMAL HIGH (ref 11.4–15.2)

## 2017-06-08 LAB — HEPARIN LEVEL (UNFRACTIONATED): Heparin Unfractionated: <0.1 IU/mL — ABNORMAL LOW (ref 0.30–0.70)

## 2017-06-08 MED ORDER — WARFARIN SODIUM 4 MG PO TABS
4.0000 mg | ORAL_TABLET | Freq: Once | ORAL | Status: AC
  Administered 2017-06-08: 4 mg via ORAL
  Filled 2017-06-08: qty 1

## 2017-06-08 MED ORDER — POLYETHYLENE GLYCOL 3350 17 G PO PACK
17.0000 g | PACK | Freq: Every day | ORAL | Status: DC
  Administered 2017-06-09: 17 g via ORAL
  Filled 2017-06-08 (×4): qty 1

## 2017-06-08 MED ORDER — BARIUM SULFATE 2.1 % PO SUSP: 900.0000 mL | Freq: Once | ORAL | Status: DC

## 2017-06-08 NOTE — Progress Notes (Signed)
Patient ID: Crystal Landry, female   DOB: Sep 28, 1927, 83 y.o.   MRN: [id number]    [facility] PROGRESS NOTE  Crystal Landry JAS:[id number] DOB: May 08, 1927 DOA: 05/28/2017 PCP: Crystal Harrier, MD  HPI/Subjective: Patient continues to Complain of abdominal pain and not able to eat Son is at bedside he states that he wants to discuss this with GI see what their thoughts are Objective: Vitals:   06/08/17 1233 06/08/17 1438  BP: (!) 127/48 (!) 119/59  Pulse: 91   Resp: 18   Temp: 97.9 F (36.6 C)   SpO2: 94%     Filed Weights   06/06/17 0458 06/07/17 0503 06/08/17 0345  Weight: 129 lb 4.8 oz (58.7 kg) 126 lb 4.8 oz (57.3 kg) 124 lb 1.9 oz (56.3 kg)    ROS: Review of Systems  Constitutional: Negative for chills and fever.  Eyes: Negative for blurred vision.  Respiratory: Negative for cough and shortness of breath.   Cardiovascular: Negative for chest pain.  Gastrointestinal: Negative for abdominal pain, constipation, diarrhea, nausea and vomiting.  Genitourinary: Negative for dysuria.  Musculoskeletal: Negative for joint pain.  Neurological: Negative for dizziness and headaches.   Exam: Physical Exam  Constitutional: She is oriented to person, place, and time.  HENT:  Nose: No mucosal edema.  Mouth/Throat: No oropharyngeal exudate or posterior oropharyngeal edema.  Eyes: Pupils are equal, round, and reactive to light. Conjunctivae, EOM and lids are normal.  Neck: No JVD present. Carotid bruit is not present. No edema present. No thyroid mass and no thyromegaly present.  Cardiovascular: S1 normal and S2 normal.  Exam reveals no gallop.   Murmur heard.  Systolic murmur is present with a grade of 4/6  Pulses:      Dorsalis pedis pulses are 2+ on the right side, and 2+ on the left side.  Metallic heart valve  Respiratory: No respiratory distress. She has no wheezes. She has no rhonchi. She has no rales.  GI: Soft. Bowel sounds are normal. There is no  tenderness.  Musculoskeletal:       Right shoulder: She exhibits no swelling.  Lymphadenopathy:    She has no cervical adenopathy.  Neurological: She is alert and oriented to person, place, and time. No cranial nerve deficit.  Skin: Skin is warm. No rash noted. Nails show no clubbing.  Psychiatric: She has a normal mood and affect.      Data Reviewed: Basic Metabolic Panel:  Recent Labs Lab 06/01/17 2356 06/03/17 0454 06/04/17 0357  NA 138 142 141  K 4.1 3.9 4.3  CL 112* 111 109  CO2 22 25 27   GLUCOSE 141* 100* 116*  BUN 23* 17 22*  CREATININE 0.79 0.74 0.76  CALCIUM 8.0* 8.3* 8.4*  MG 1.9  --  1.6*  PHOS 1.7* 3.1 2.9   [place] Function Tests: No results for input(s): AST, ALT, ALKPHOS, BILITOT, PROT, ALBUMIN in the last 168 hours. CBC:  Recent Labs Lab 06/04/17 0357 06/05/17 0359 06/06/17 0626 06/07/17 0440 06/08/17 0420  WBC 6.9 7.8 8.6 9.7 8.4  HGB 11.5* 11.5* 11.7* 11.6* 11.5*  HCT 33.8* 33.0* 33.8* 34.2* 34.3*  MCV 98.3 97.5 97.4 96.0 96.1  PLT 155 133* 166 174 180      Scheduled Meds: . Barium Sulfate  900 mL Oral Once  . diltiazem  300 mg Oral Daily  . diltiazem  60 mg Oral Q8H  . docusate sodium  100 mg Oral BID  . feeding supplement (ENSURE ENLIVE)  237 mL  Oral BID BM  . oxybutynin  5 mg Oral QHS  . pantoprazole (PROTONIX) IV  40 mg Intravenous Q12H  . polyethylene glycol  17 g Oral Daily  . simethicone  80 mg Oral QID  . simvastatin  20 mg Oral q1800  . sodium chloride flush  10-40 mL Intracatheter Q12H  . sucralfate  1 g Oral TID WC & HS  . traZODone  25 mg Oral Once  . warfarin  4 mg Oral ONCE-1800  . Warfarin - Pharmacist Dosing Inpatient   Does not apply q1800   Continuous Infusions:   Assessment/Plan:  1. Functional Bowel obstruction. Severe inflammation and esophagus stomach and duodenum.  Continue soft diet.  Biopsy results are negative. MRI did not show any mass. Continues to be symptomatic I have ordered a CT of the abdomen and  also discuss with GI to reevaluate the patient. Continue current medications 2. Atrial fibrillation with elevated heart rate with activity.  Continue Cardizem CD 300 mg daily. INR therapeutic stop IV heparin, due to poor control heart rate I will have to add short-acting Cardizem to current regimen 3. Acute kidney injury. Improved 4. Left-sided hydronephrosis and hydroureter. Urology on call on admission recommended outpatient follow-up. This appears to be a chronic 5. Hyperlipidemia unspecified. Restart simvastatin 6. Metallic heart valve. Coumadin dosing by pharmacy  7. Constipation. continued Colace 8. Disposition to skilled nursing facility patient will need to be requalified Code Status:     Code Status Orders        Start     Ordered   05/28/17 1732  Full code  Continuous     05/28/17 1732    Code Status History    Date Active Date Inactive Code Status Order ID Comments User Context   This patient has a current code status but no historical code status.    Advance Directive Documentation     Most Recent Value  Type of Advance Directive  Healthcare Power of Attorney  Pre-existing out of facility DNR order (yellow form or pink MOST form)  -  "MOST" Form in Place?  -     Family Communication: Son at the bedside Disposition Plan: likely will need rehabilitation for weakness.  Consultants:  Gastroenterology  Surgery  Cardiology  Time spent: 25 minutes  [place], [facility]

## 2017-06-08 NOTE — Progress Notes (Addendum)
[facility] [place] for Warfarin Indication: atrial fibrillation  Allergies  Allergen Reactions  . Codeine Sulfate     Other reaction(s): Unknown  . Diphenhydramine Other (See Comments)    skin became red all over  . Ivp Dye [Iodinated Diagnostic Agents] Other (See Comments)    Skin becomes red all over  . Quinidine     Other reaction(s): Unknown Other reaction(s): Unknown    Patient Measurements: Height: 5' 2.5" (158.8 cm) Weight: 124 lb 1.9 oz (56.3 kg) IBW/kg (Calculated) : 51.25  Heparin DW: 60 kg  Vital Signs: Temp: 98.1 F (36.7 C) (09/10 0816) Temp Source: Oral (09/10 0816) BP: 109/46 (09/10 0816) Pulse Rate: 69 (09/10 0816)  Labs:  Recent Labs  06/06/17 0626 06/07/17 0440 06/08/17 0420  HGB 11.7* 11.6* 11.5*  HCT 33.8* 34.2* 34.3*  PLT 166 174 180  LABPROT 22.2* 26.4* 27.9*  INR 1.96 2.45 2.63  HEPARINUNFRC 0.44 0.38 <0.10*    Estimated Creatinine Clearance: 40.1 mL/min (by C-G formula based on SCr of 0.76 mg/dL).   Medical History: Past Medical History:  Diagnosis Date  . A-fib ([place])   . Anxiety   . Atrial fibrillation ([place])   . Cancer ([facility])    endometrial  . Depression   . Hypertension    Assessment: 83 y/o F with a known h/o atrial fibrillation and mechnical mitral valve on warfarin 4 mg daily PTA admitted with abdominal pain as well as intractable N/V. Pt warfarin was held and was given vit K to reduce INR for endoscopy. Admission INR therapeutic at 2.62.  Date INR Dose  8/30 2.62 HELD 8/31 3.1   HELD 9/1 3.96   HELD- Vit K 2.5mg  IV given bc pt needed procedure --> INR 1.51 9/2       1.47  6mg  9/3 1.44 HELD- patient may have received a 2nd dose on 9/2 9/4       2.88 HELD 9/5 1.83 6mg   9/6  1.63 6mg  9/7 1.56 8mg   9/8 1.96 5mg  9/9 2.45 2mg  9/10 2.63    Goal of Therapy:  INR 2.5-3.5  Plan Warfarin:  INR therapeutic. Pt had therapeutic INR on home dose on admission. Will resume home dose and follow  INR. Warfarin 4 mg PO x1 ordered for tonight.  Will follow daily INR levels and order doses based on trend.  Hgb and Plt count stable from yesterday.   Rocky Morel, PharmD, BCPS Clinical Pharmacist 06/08/2017 11:06 AM

## 2017-06-08 NOTE — Progress Notes (Signed)
CC: Abdominal pain Subjective: 83 year old female with abdominal pain and visualized duodenitis and gastritis. General surgery called to see patient again today by internal medicine. Dr. Posey Pronto requested repeat evaluation to determine if anything else can be done for this patient. Upon visiting with the patient and her son, they discussed how she has tolerated her liquids throughout the day. She has a very limited appetite due to development of nausea. Every time she something more solid than liquids she has postprandial vomiting. She has been seen by GI multiple times since her last visit by a general surgeon. Recent endoscopy biopsies returned without malignancy or obvious cause of what appears to be a functional obstruction. She has been having bowel function and denies any fevers, chills, chest pain, shortness of breath. She has been restarted on her Coumadin and her INR is therapeutic today for her mechanical heart valve.  Objective: Vital signs in last 24 hours: Temp:  [97.9 F (36.6 C)-98.9 F (37.2 C)] 97.9 F (36.6 C) (09/10 1233) Pulse Rate:  [69-91] 91 (09/10 1233) Resp:  [17-18] 18 (09/10 1233) BP: (109-127)/(46-66) 119/59 (09/10 1438) SpO2:  [93 %-98 %] 93 % (09/10 1521) Weight:  [56.3 kg (124 lb 1.9 oz)] 56.3 kg (124 lb 1.9 oz) (09/10 0345) Last BM Date: 06/06/17  Intake/Output from previous day: 09/09 0701 - 09/10 0700 In: 0  Out: 1050 [Urine:1050] Intake/Output this shift: No intake/output data recorded.  Physical exam:  Gen.: No acute distress Chest: Clear to auscultation Heart: Irregular, mechanical Abdomen: Soft, nontender, nondistended  Lab Results: CBC   Recent Labs  06/07/17 0440 06/08/17 0420  WBC 9.7 8.4  HGB 11.6* 11.5*  HCT 34.2* 34.3*  PLT 174 180   BMET No results for input(s): NA, K, CL, CO2, GLUCOSE, BUN, CREATININE, CALCIUM in the last 72 hours. PT/INR  Recent Labs  06/07/17 0440 06/08/17 0420  LABPROT 26.4* 27.9*  INR 2.45 2.63    ABG No results for input(s): PHART, HCO3 in the last 72 hours.  Invalid input(s): PCO2, PO2  Studies/Results: Ct Abdomen Pelvis Wo Contrast  Result Date: 06/08/2017 CLINICAL DATA:  Unspecified abdominal pain EXAM: CT ABDOMEN AND PELVIS WITHOUT CONTRAST TECHNIQUE: Multidetector CT imaging of the abdomen and pelvis was performed following the standard protocol without IV contrast. COMPARISON:  Abdominal MRI from 4 days ago.  Abdominal CT 05/28/2017 FINDINGS: Lower chest: Postoperative mitral valve. Aortic valvular calcification. Coronary atherosclerosis. Cardiomegaly. Mild scar-like appearance in the left posterior costophrenic sulcus. Hepatobiliary: No focal liver abnormality.Cholelithiasis. No evidence of biliary obstruction or inflammation. Pancreas: Atrophic.  Generalized atrophy.  No acute finding Spleen: Unremarkable. Adrenals/Urinary Tract: Negative adrenals. Mild left hydronephrosis and hydroureter to the level of the pelvis. No visible obstructive process. Thickened appearance of the bladder with diverticulum at the right base. No urolithiasis. Stomach/Bowel: Re- demonstrated gas dilated stomach and proximal duodenum, with transition at the third portion duodenum. Abnormal mucosa in this area was biopsied recently. No evidence of perforation. No appendicitis. Distal colonic diverticulosis. Vascular/Lymphatic: No acute vascular abnormality. Diffuse atherosclerotic calcification. No mass or adenopathy. Reproductive:Hysterectomy.  Negative adnexae. Other: No ascites or pneumoperitoneum. Musculoskeletal: Advanced disc and facet degeneration with scoliosis. Osteopenia. IMPRESSION: 1. Obstruction at the third portion duodenum, also noted 05/28/2017. This area has been recently characterized by MRI and endoscopy/biopsy. 2. Stable left hydroureteronephrosis with transition at the pelvis where there is no visible obstructive process. 3. Bladder wall thickening and diverticulum suggesting chronic outlet  obstruction. 4. Cholelithiasis. Electronically Signed   By: Neva Seat.D.  On: 06/08/2017 12:25    Anti-infectives: Anti-infectives    None      Assessment/Plan:  83 year old female with fluid appears to be a functional obstruction of her distal stomach and third portion of her duodenum. Had a long conversation with the patient and her son about this problem, possible causes, possible treatment options. After this discussion the patient and the son both voiced that should the patient require surgery they would prefer to be at the [facility]. Given the patient's current presentation, I discussed that it is unlikely that she would require surgery for this to resolve. Sometimes inflammation takes more time to resolve. However, discussed that we would repeat a plain abdominal film x-ray in the morning to evaluate for progression of the contrast she drank earlier today for the CT scan. If the contrast has not progressed beyond the third portion of the duodenum, I would recommend transfer to tertiary care center for further workup and treatment. If the contrast has progressed beyond the third portion of the duodenum then this likely indicates that it is simply inflammation that is slowing peristalsis. Patient and her son both voiced appreciation of my explanation of the likely problems. Surgery will follow at this time.  Linsey Arteaga T. Adonis Huguenin, MD, [facility] General Surgeon [facility]  Day [facility] [phone] Night [facility] [phone] 06/08/2017

## 2017-06-08 NOTE — Care Management (Signed)
Barrier- Patient experiencing abdominal pain, not feeling like she can eat.  She has had elevated heart rate requiring change in medications. Elevated heart rate prevented patient from being able to participate with physical therapy 06/06/2017.  GI is again consulting

## 2017-06-08 NOTE — Progress Notes (Signed)
Physical Therapy Treatment Patient Details Name: Crystal Landry MRN: [id number] DOB: [birth year] Today's Date: 06/08/2017    History of Present Illness [name] here with a-fib/RVR and weakness. Patient has PMH significant for endometrial CA, depression, HTN, abdominal pain; CT scan showed possible blockage in esophagus. Patient underwent EGD on 9/3 for GI bleed.     PT Comments    Initial attempt, pt with MD. Second attempt agreeable to PT. Pt denies pain, mild ill feeling. Pt demonstrates Mod I with bed mobility and Min guard for safety with transfer. Pt ambulates subjectively below baseline speed, but much improved distance without loss of balance or complaints. Pt notes fatigue post walk and wishes back to bed; feels she cannot participate in exercise at this time. Educated son/pt with written handout provided. Continue PT to progress strength and endurance to improve functional mobility to baseline.   Follow Up Recommendations  Other (comment);SNF (Son wants SNF; pt ambulated/moved well today. )     Equipment Recommendations       Recommendations for Other Services Rehab consult     Precautions / Restrictions Precautions Precautions: Fall Precaution Comments: monitor heart rate Restrictions Weight Bearing Restrictions: No    Mobility  Bed Mobility Overal bed mobility: Modified Independent Bed Mobility: Supine to Sit;Sit to Supine     Supine to sit: Modified independent (Device/Increase time) Sit to supine: Modified independent (Device/Increase time)   General bed mobility comments: mild increased time  Transfers Overall transfer level: Needs assistance Equipment used: Rolling walker (2 wheeled) Transfers: Sit to/from Stand Sit to Stand: Min guard            Ambulation/Gait Ambulation/Gait assistance: Min guard Ambulation Distance (Feet): 210 Feet Assistive device: Rolling walker (2 wheeled) Gait Pattern/deviations: Step-through pattern;Narrow base of  support;Decreased stride length   Gait velocity interpretation: Below normal speed for age/gender General Gait Details: Safe, steady, but feels weakened    Financial trader Rankin (Stroke Patients Only)       Balance Overall balance assessment: Needs assistance Sitting-balance support: Feet supported Sitting balance-Leahy Scale: Good     Standing balance support: Bilateral upper extremity supported Standing balance-Leahy Scale: Fair Standing balance comment: RW used                            Cognition Arousal/Alertness: Awake/alert Behavior During Therapy: WFL for tasks assessed/performed Overall Cognitive Status: Within Functional Limits for tasks assessed                                        Exercises Other Exercises Other Exercises: Educated pt/son via written exercise packet on technique, frequency, repetitions for lower extremity exercises.     General Comments        Pertinent Vitals/Pain Pain Assessment: No/denies pain    Home Living                      Prior Function            PT Goals (current goals can now be found in the care plan section) Acute Rehab PT Goals Patient Stated Goal: Get my energy back. Progress towards PT goals: Progressing toward goals    Frequency    Min 2X/week      PT  Plan Current plan remains appropriate;Other (comment) (better determination when d/c relevant again. Son want SNF)    Co-evaluation              AM-PAC PT "6 Clicks" Daily Activity  Outcome Measure  Difficulty turning over in bed (including adjusting bedclothes, sheets and blankets)?: None Difficulty moving from lying on back to sitting on the side of the bed? : None Difficulty sitting down on and standing up from a chair with arms (e.g., wheelchair, bedside commode, etc,.)?: Unable Help needed moving to and from a bed to chair (including a wheelchair)?: A  Little Help needed walking in hospital room?: A Little Help needed climbing 3-5 steps with a railing? : A Little 6 Click Score: 18    End of Session   Activity Tolerance: Patient limited by fatigue;Other (comment) (weakness) Patient left: in bed;with call bell/phone within reach;with bed alarm set;with family/visitor present   PT Visit Diagnosis: Unsteadiness on feet (R26.81);Muscle weakness (generalized) (M62.81)     Time: 1610-9604 PT Time Calculation (min) (ACUTE ONLY): 18 min  Charges:  $Gait Training: 8-22 mins                    G CodesLarae Grooms, PTA 06/08/2017, 4:38 PM

## 2017-06-08 NOTE — Progress Notes (Signed)
   [name] MD, MRCP(U.K) [address]  [place]  [place], [place] [address]  Main: [phone]    Crystal Landry is being followed for abdominal pain  Subjective: Abdominal pain after eating returned, worsening of nausea, abdominal distension    Objective: Vital signs in last 24 hours: Vitals:   06/07/17 1936 06/08/17 0345 06/08/17 0434 06/08/17 0816  BP: 120/64  120/66 (!) 109/46  Pulse: 78  83 69  Resp:   17   Temp: 98.9 F (37.2 C)  98.5 F (36.9 C) 98.1 F (36.7 C)  TempSrc: Oral  Oral Oral  SpO2: 95%  95% 98%  Weight:  124 lb 1.9 oz (56.3 kg)    Height:       Weight change: -2 lb 2.9 oz (-0.989 kg)  Intake/Output Summary (Last 24 hours) at 06/08/17 0957 Last data filed at 06/07/17 2100  Gross per 24 hour  Intake                0 ml  Output             1050 ml  Net            -1050 ml     Exam: Heart:: Regular rate and rhythm, S1S2 present or without murmur or extra heart sounds Lungs: normal, clear to auscultation and clear to auscultation and percussion Abdomen: mildly distended, mildly tender, BS+   Lab Results: @LABTEST2 @ Micro Results: No results found for this or any previous visit (from the past 240 hour(s)). Studies/Results: No results found. Medications: I have reviewed the patient's current medications. Scheduled Meds: . diltiazem  300 mg Oral Daily  . diltiazem  60 mg Oral Q8H  . docusate sodium  100 mg Oral BID  . feeding supplement (ENSURE ENLIVE)  237 mL Oral BID BM  . oxybutynin  5 mg Oral QHS  . pantoprazole (PROTONIX) IV  40 mg Intravenous Q12H  . polyethylene glycol  17 g Oral Daily  . simethicone  80 mg Oral QID  . simvastatin  20 mg Oral q1800  . sodium chloride flush  10-40 mL Intracatheter Q12H  . sucralfate  1 g Oral TID WC & HS  . traZODone  25 mg Oral Once  . Warfarin - Pharmacist Dosing Inpatient   Does not apply q1800   Continuous Infusions: PRN Meds:.acetaminophen **OR** acetaminophen, alum & mag  hydroxide-simeth, lidocaine, LORazepam, metoprolol tartrate, ondansetron **OR** ondansetron (ZOFRAN) IV, sodium chloride flush, temazepam   Assessment: Active Problems:   Atrial fibrillation with RVR (HCC)   Encounter for imaging study to confirm nasogastric (NG) tube placement  Crystal Rease Crystal Landry a 83 y.o.y/o femaleadmitted with nausea, vomiting and diarrhea. CT scan concerning for duodenal obstruction.EGD with severely edematous distal stomach of unclear etiology with a functional obstruction. Duodenal inflammation in 3rd portion of the duodenum with mild narrowing but no obstruction. No definite mass seen. Biopsies negative for malignancy /. Pain improved then recurred.  Plan  1. Ct scan of the abdomen to r/o repeat obstruction  2. F/u stool for H pylori which is in process.      LOS: 11 days   [name] 06/08/2017, 9:57 AM

## 2017-06-08 NOTE — Progress Notes (Signed)
Occupational Therapy Treatment Patient Details Name: Crystal Landry MRN: [id number] DOB: January 21, 1927 Today's Date: 06/08/2017    History of present illness [name] here with a-fib/RVR and weakness. Patient has PMH significant for endometrial CA, depression, HTN, abdominal pain; CT scan showed possible blockage in esophagus. Patient underwent EGD on 9/3 for GI bleed.    OT comments  Pt seen for OT treatment this date. Son in room at start of session, reported getting "bad news" that the obstruction in pt's abdomen was worse and GI consulting with surgery to further determine POC. Pt's son very worried that pt needs continued rehab following the hospitalization. Pt somewhat fatigued from recent PT session ambulating around nurses station but agreeable to OT session. Pt modified independent with bed mobility with good safety awareness and close supervision to min guard for sit to stand transfers and ambulating to/from bathroom. Close supervision to [place] guard for toilet transfer from regular height toilet and use of R grab bar. Pt indep with toileting hygiene. Pt heart rate up to 134 with mobility, so SOB noted. Pt continues to benefit from skilled OT services to address functional OT goals. Continue to recommend STR following hospitalization due to pt's deconditioning.    Follow Up Recommendations  SNF    Equipment Recommendations       Recommendations for Other Services      Precautions / Restrictions Precautions Precautions: Fall Precaution Comments: monitor heart rate Restrictions Weight Bearing Restrictions: No       Mobility Bed Mobility Overal bed mobility: Modified Independent Bed Mobility: Supine to Sit;Sit to Supine     Supine to sit: Supervision;HOB elevated Sit to supine: Supervision   General bed mobility comments: increased time but no SOB, no physical assist required.  Transfers Overall transfer level: Needs assistance Equipment used: Rolling walker (2  wheeled) Transfers: Sit to/from Stand Sit to Stand: Min guard              Balance Overall balance assessment: Needs assistance Sitting-balance support: Feet supported Sitting balance-Leahy Scale: Good     Standing balance support: Bilateral upper extremity supported;During functional activity Standing balance-Leahy Scale: Fair Standing balance comment: RW used                           ADL either performed or assessed with clinical judgement   ADL Overall ADL's : Needs assistance/impaired     Grooming: Supervision/safety;Sitting Grooming Details (indicate cue type and reason): close supervision for grooming tasks standing at sink with no LOB.   Upper Body Bathing Details (indicate cue type and reason): + additional time to complete             Toilet Transfer: Min guard;Regular Toilet;Grab bars;RW;Ambulation Armed forces technical officer Details (indicate cue type and reason): min guard to ambulate from EOB to regular height toilet with close supervision - min guard and RW. No LOB, good safety awareness. Toileting- Clothing Manipulation and Hygiene: Independent;Sitting/lateral lean       Functional mobility during ADLs: Min guard;Rolling walker       Vision Baseline Vision/History: Wears glasses Wears Glasses: Reading only Patient Visual Report: No change from baseline Vision Assessment?: No apparent visual deficits   Perception     Praxis      Cognition Arousal/Alertness: Awake/alert Behavior During Therapy: WFL for tasks assessed/performed Overall Cognitive Status: Within Functional Limits for tasks assessed  Exercises     Shoulder Instructions       General Comments      Pertinent Vitals/ Pain       Pain Assessment: No/denies pain  Home Living                                          Prior Functioning/Environment              Frequency  Min 1X/week         Progress Toward Goals  OT Goals(current goals can now be found in the care plan section)  Progress towards OT goals: Progressing toward goals  Acute Rehab OT Goals Patient Stated Goal: Get my energy back. OT Goal Formulation: With patient Time For Goal Achievement: 06/16/17 Potential to Achieve Goals: Good  Plan Discharge plan remains appropriate;Frequency remains appropriate    Co-evaluation                 AM-PAC PT "6 Clicks" Daily Activity     Outcome Measure   Help from another person eating meals?: None Help from another person taking care of personal grooming?: A Little Help from another person toileting, which includes using toliet, bedpan, or urinal?: A Little Help from another person bathing (including washing, rinsing, drying)?: A Little Help from another person to put on and taking off regular upper body clothing?: None Help from another person to put on and taking off regular lower body clothing?: A Little 6 Click Score: 20    End of Session Equipment Utilized During Treatment: Rolling walker  OT Visit Diagnosis: Other abnormalities of gait and mobility (R26.89);Muscle weakness (generalized) (M62.81)   Activity Tolerance Patient tolerated treatment well   Patient Left in bed;with call bell/phone within reach;with bed alarm set   Nurse Communication      Functional Assessment Tool Used: AM-PAC 6 Clicks Daily Activity;Clinical judgement Functional Limitation: Self care Self Care Current Status (J6283): At least 20 percent but less than 40 percent impaired, limited or restricted Self Care Goal Status (T5176): At least 1 percent but less than 20 percent impaired, limited or restricted   Time: 1551-1601 OT Time Calculation (min): 10 min  Charges: OT G-codes **NOT FOR INPATIENT CLASS** Functional Assessment Tool Used: AM-PAC 6 Clicks Daily Activity;Clinical judgement Functional Limitation: Self care Self Care Current Status (H6073): At least 20 percent  but less than 40 percent impaired, limited or restricted Self Care Goal Status (X1062): At least 1 percent but less than 20 percent impaired, limited or restricted OT General Charges $OT Visit: 1 Visit OT Treatments $Self Care/Home Management : 8-22 mins  Jeni Salles, MPH, MS, OTR/L ascom [phone] 06/08/17, 4:16 PM

## 2017-06-08 NOTE — Plan of Care (Signed)
Problem: Nutrition: Goal: Adequate nutrition will be maintained Outcome: Not Progressing Patient is not adequately tolerating diet. Patient experienced vomiting after eating meal on first shift. Patient continues to have nausea with any intake.

## 2017-06-08 NOTE — Progress Notes (Signed)
Patient continues to complain of abdominal discomfort and not able to eat. Son request that he d/w gi md to see if anything else can be done. Explained him that It will take time for things to heal.

## 2017-06-08 NOTE — Clinical Social Work Note (Signed)
CSW updated patient and her son, that [place] will be sending updated clinicals to insurance company once PT and OT have seen her today.  CSW updated [facility] and will continue to follow patient's progress throughout discharge planning.  Jones Broom. [place], MSW, [place]  06/08/2017 5:11 PM

## 2017-06-09 ENCOUNTER — Inpatient Hospital Stay: Payer: Medicare Other

## 2017-06-09 LAB — PROTIME-INR
INR: 2.89
PROTHROMBIN TIME: 30 s — AB (ref 11.4–15.2)

## 2017-06-09 LAB — H. PYLORI ANTIGEN, STOOL: H. Pylori Stool Ag, Eia: NEGATIVE

## 2017-06-09 MED ORDER — PROMETHAZINE HCL 25 MG/ML IJ SOLN
12.5000 mg | Freq: Once | INTRAMUSCULAR | Status: AC
  Administered 2017-06-09: 12.5 mg via INTRAVENOUS
  Filled 2017-06-09: qty 1

## 2017-06-09 MED ORDER — WARFARIN SODIUM 4 MG PO TABS
4.0000 mg | ORAL_TABLET | Freq: Every day | ORAL | Status: DC
  Administered 2017-06-09: 4 mg via ORAL
  Filled 2017-06-09: qty 1

## 2017-06-09 NOTE — Clinical Social Work Note (Signed)
CSW received phone call from patient's insurance company they have reauthorized patient to go to [facility].  New Josem Kaufmann is [phone], effective date for 06-10-17, CSW left a message for [facility].  Jones Broom. [place], MSW, [place]  06/09/2017 9:48 AM

## 2017-06-09 NOTE — Progress Notes (Signed)
Crystal Bellows MD, MRCP(U.K) [address]  [place]  [place], [place] [address]  Main: [phone]    Crystal Landry is being followed for abdominal pain    Subjective: Did well yesterday , threw up again thismorning    Objective: Vital signs in last 24 hours: Vitals:   06/08/17 1521 06/08/17 1917 06/09/17 0344 06/09/17 0754  BP:  (!) 117/53 (!) 111/54 (!) 108/50  Pulse:  78 76 81  Resp:  18 18 18   Temp:  98.3 F (36.8 C) 98.6 F (37 C) 98.5 F (36.9 C)  TempSrc:  Oral Oral Oral  SpO2: 93% 95% 96% 95%  Weight:   125 lb 11.2 oz (57 kg)   Height:       Weight change: 1 lb 9.3 oz (0.717 kg)  Intake/Output Summary (Last 24 hours) at 06/09/17 1101 Last data filed at 06/09/17 1018  Gross per 24 hour  Intake              373 ml  Output             1000 ml  Net             -627 ml     Exam: Heart:: Regular rate and rhythm, S1S2 present or without murmur or extra heart sounds Lungs: normal, clear to auscultation and clear to auscultation and percussion Abdomen: mildly distended, no tenderness Bs+   Lab Results: @LABTEST2 @ Micro Results: No results found for this or any previous visit (from the past 240 hour(s)). Studies/Results: Ct Abdomen Pelvis Wo Contrast  Result Date: 06/08/2017 CLINICAL DATA:  Unspecified abdominal pain EXAM: CT ABDOMEN AND PELVIS WITHOUT CONTRAST TECHNIQUE: Multidetector CT imaging of the abdomen and pelvis was performed following the standard protocol without IV contrast. COMPARISON:  Abdominal MRI from 4 days ago.  Abdominal CT 05/28/2017 FINDINGS: Lower chest: Postoperative mitral valve. Aortic valvular calcification. Coronary atherosclerosis. Cardiomegaly. Mild scar-like appearance in the left posterior costophrenic sulcus. Hepatobiliary: No focal liver abnormality.Cholelithiasis. No evidence of biliary obstruction or inflammation. Pancreas: Atrophic.  Generalized atrophy.  No acute finding Spleen: Unremarkable. Adrenals/Urinary Tract:  Negative adrenals. Mild left hydronephrosis and hydroureter to the level of the pelvis. No visible obstructive process. Thickened appearance of the bladder with diverticulum at the right base. No urolithiasis. Stomach/Bowel: Re- demonstrated gas dilated stomach and proximal duodenum, with transition at the third portion duodenum. Abnormal mucosa in this area was biopsied recently. No evidence of perforation. No appendicitis. Distal colonic diverticulosis. Vascular/Lymphatic: No acute vascular abnormality. Diffuse atherosclerotic calcification. No mass or adenopathy. Reproductive:Hysterectomy.  Negative adnexae. Other: No ascites or pneumoperitoneum. Musculoskeletal: Advanced disc and facet degeneration with scoliosis. Osteopenia. IMPRESSION: 1. Obstruction at the third portion duodenum, also noted 05/28/2017. This area has been recently characterized by MRI and endoscopy/biopsy. 2. Stable left hydroureteronephrosis with transition at the pelvis where there is no visible obstructive process. 3. Bladder wall thickening and diverticulum suggesting chronic outlet obstruction. 4. Cholelithiasis. Electronically Signed   By: Monte Fantasia M.D.   On: 06/08/2017 12:25   Dg Abd Portable 2v  Result Date: 06/09/2017 CLINICAL DATA:  Abdominal pain. EXAM: PORTABLE ABDOMEN - 2 VIEW COMPARISON:  CT 06/08/2017. FINDINGS: Prior median sternotomy. Cardiomegaly. Soft tissue structures of the abdomen are unremarkable. Gastric distention again noted. Stool and oral contrast noted throughout the colon. Vascular calcification noted. Degenerative changes lumbar spine and both hips. Scoliosis lumbar spine. IMPRESSION: Gastric distention again noted. Reference is made to prior CT report 06/08/2017. Colonic gas pattern  is normal. Stool in oral contrast noted in the colon. Electronically Signed   By: Marcello Moores  Register   On: 06/09/2017 06:35   Medications: I have reviewed the patient's current medications. Scheduled Meds: . Barium  Sulfate  900 mL Oral Once  . diltiazem  300 mg Oral Daily  . diltiazem  60 mg Oral Q8H  . docusate sodium  100 mg Oral BID  . feeding supplement (ENSURE ENLIVE)  237 mL Oral BID BM  . oxybutynin  5 mg Oral QHS  . pantoprazole (PROTONIX) IV  40 mg Intravenous Q12H  . polyethylene glycol  17 g Oral Daily  . simethicone  80 mg Oral QID  . simvastatin  20 mg Oral q1800  . sodium chloride flush  10-40 mL Intracatheter Q12H  . sucralfate  1 g Oral TID WC & HS  . traZODone  25 mg Oral Once  . warfarin  4 mg Oral q1800  . Warfarin - Pharmacist Dosing Inpatient   Does not apply q1800   Continuous Infusions: PRN Meds:.acetaminophen **OR** acetaminophen, alum & mag hydroxide-simeth, lidocaine, LORazepam, metoprolol tartrate, ondansetron **OR** ondansetron (ZOFRAN) IV, sodium chloride flush, temazepam   Assessment: Active Problems:   Atrial fibrillation with RVR (HCC)   Encounter for imaging study to confirm nasogastric (NG) tube placement   Abdominal pain   Crystal Demetrius Stewartis a 83 y.o.y/o femaleadmitted with nausea, vomiting and diarrhea. CT scan concerning for duodenal obstruction.EGD with severely edematous distal stomach of unclear etiology with a functional obstruction. Duodenal inflammation in 3rd portion of the duodenum with mild narrowing but no obstruction. No definite mass seen. Biopsies negative for malignancy /. Pain improved then recurred. CT scan showed recurrence of duodenal obstruction with no obvious mass. Repeat X ray this am shows the contrast in the colon suggesting that she has ongoing motility , clinically she threw up again this am   Plan  1. F/u stool for H pylori which is in process.  2. Still vomiting - would need atleast 24 hours of no vomiting and ability to keep food down .I have discussed with Dr Posey Pronto and the dietician- low residue diet      LOS: 12 days   Crystal Landry 06/09/2017, 11:01 AM

## 2017-06-09 NOTE — Progress Notes (Signed)
MD notified. Pt is complaining of nausea with vomitus after prn zofran. MD will order phenerghan. I will continue to assess.

## 2017-06-09 NOTE — Progress Notes (Signed)
Patient ID: Crystal Landry, female   DOB: May 04, 1927, 83 y.o.   MRN: [id number]    [name] PROGRESS NOTE  Crystal Landry LPF:[id number] DOB: 09/15/1927 DOA: 05/28/2017 PCP: Crystal Harrier, MD  HPI/Subjective: Tolerating liquid   Objective: Vitals:   06/09/17 0754 06/09/17 1134  BP: (!) 108/50 128/60  Pulse: 81 74  Resp: 18 20  Temp: 98.5 F (36.9 C) 99.1 F (37.3 C)  SpO2: 95% 98%    Filed Weights   06/07/17 0503 06/08/17 0345 06/09/17 0344  Weight: 126 lb 4.8 oz (57.3 kg) 124 lb 1.9 oz (56.3 kg) 125 lb 11.2 oz (57 kg)    ROS: Review of Systems  Constitutional: Negative for chills and fever.  Eyes: Negative for blurred vision.  Respiratory: Negative for cough and shortness of breath.   Cardiovascular: Negative for chest pain.  Gastrointestinal: Negative for abdominal pain, constipation, diarrhea, nausea and vomiting.  Genitourinary: Negative for dysuria.  Musculoskeletal: Negative for joint pain.  Neurological: Negative for dizziness and headaches.   Exam: Physical Exam  Constitutional: She is oriented to person, place, and time.  HENT:  Nose: No mucosal edema.  Mouth/Throat: No oropharyngeal exudate or posterior oropharyngeal edema.  Eyes: Pupils are equal, round, and reactive to light. Conjunctivae, EOM and lids are normal.  Neck: No JVD present. Carotid bruit is not present. No edema present. No thyroid mass and no thyromegaly present.  Cardiovascular: S1 normal and S2 normal.  Exam reveals no gallop.   Murmur heard.  Systolic murmur is present with a grade of 4/6  Pulses:      Dorsalis pedis pulses are 2+ on the right side, and 2+ on the left side.  Metallic heart valve  Respiratory: No respiratory distress. She has no wheezes. She has no rhonchi. She has no rales.  GI: Soft. Bowel sounds are normal. There is no tenderness.  Musculoskeletal:       Right shoulder: She exhibits no swelling.  Lymphadenopathy:    She has no cervical  adenopathy.  Neurological: She is alert and oriented to person, place, and time. No cranial nerve deficit.  Skin: Skin is warm. No rash noted. Nails show no clubbing.  Psychiatric: She has a normal mood and affect.      Data Reviewed: Basic Metabolic Panel:  Recent Labs Lab 06/03/17 0454 06/04/17 0357  NA 142 141  K 3.9 4.3  CL 111 109  CO2 25 27  GLUCOSE 100* 116*  BUN 17 22*  CREATININE 0.74 0.76  CALCIUM 8.3* 8.4*  MG  --  1.6*  PHOS 3.1 2.9   Liver Function Tests: No results for input(s): AST, ALT, ALKPHOS, BILITOT, PROT, ALBUMIN in the last 168 hours. CBC:  Recent Labs Lab 06/04/17 0357 06/05/17 0359 06/06/17 0626 06/07/17 0440 06/08/17 0420  WBC 6.9 7.8 8.6 9.7 8.4  HGB 11.5* 11.5* 11.7* 11.6* 11.5*  HCT 33.8* 33.0* 33.8* 34.2* 34.3*  MCV 98.3 97.5 97.4 96.0 96.1  PLT 155 133* 166 174 180      Scheduled Meds: . Barium Sulfate  900 mL Oral Once  . diltiazem  300 mg Oral Daily  . diltiazem  60 mg Oral Q8H  . docusate sodium  100 mg Oral BID  . feeding supplement (ENSURE ENLIVE)  237 mL Oral BID BM  . oxybutynin  5 mg Oral QHS  . pantoprazole (PROTONIX) IV  40 mg Intravenous Q12H  . polyethylene glycol  17 g Oral Daily  . simethicone  80 mg Oral  QID  . simvastatin  20 mg Oral q1800  . sodium chloride flush  10-40 mL Intracatheter Q12H  . sucralfate  1 g Oral TID WC & HS  . traZODone  25 mg Oral Once  . warfarin  4 mg Oral q1800  . Warfarin - Pharmacist Dosing Inpatient   Does not apply q1800   Continuous Infusions:   Assessment/Plan:  1. Functional Bowel obstruction. Severe inflammation and esophagus stomach and duodenum.  Continue soft diet.  Biopsy results are negative. MRI did not show any mass. Improved, continue clear liquid diet 2. Atrial fibrillation with elevated heart rate with activity.  Continue Cardizem CD 300 mg daily. INR therapeutic stop IV heparin, due to poor control heart rate I will have to add short-acting Cardizem to current  regimen 3. Acute kidney injury. Improved 4. Left-sided hydronephrosis and hydroureter. Urology on call on admission recommended outpatient follow-up. This appears to be a chronic 5. Hyperlipidemia unspecified. Restart simvastatin 6. Metallic heart valve. Coumadin dosing by pharmacy  7. Constipation. continued Colace 8. Disposition to skilled nursing facility patient will need to be requalified Code Status:     Code Status Orders        Start     Ordered   05/28/17 1732  Full code  Continuous     05/28/17 1732    Code Status History    Date Active Date Inactive Code Status Order ID Comments User Context   This patient has a current code status but no historical code status.    Advance Directive Documentation     Most Recent Value  Type of Advance Directive  Healthcare Power of Attorney  Pre-existing out of facility DNR order (yellow form or pink MOST form)  -  "MOST" Form in Place?  -     Family Communication: Son at the bedside Disposition Plan: likely will need rehabilitation for weakness.  Consultants:  Gastroenterology  Surgery  Cardiology  Time spent: 25 minutes  [place], [facility] [place] [facility] [facility]

## 2017-06-09 NOTE — Progress Notes (Signed)
[facility] for Warfarin Indication: atrial fibrillation  Allergies  Allergen Reactions  . Codeine Sulfate     Other reaction(s): Unknown  . Diphenhydramine Other (See Comments)    skin became red all over  . Ivp Dye [Iodinated Diagnostic Agents] Other (See Comments)    Skin becomes red all over  . Quinidine     Other reaction(s): Unknown Other reaction(s): Unknown    Patient Measurements: Height: 5' 2.5" (158.8 cm) Weight: 125 lb 11.2 oz (57 kg) IBW/kg (Calculated) : 51.25  Heparin DW: 60 kg  Vital Signs: Temp: 98.5 F (36.9 C) (09/11 0754) Temp Source: Oral (09/11 0754) BP: 108/50 (09/11 0754) Pulse Rate: 81 (09/11 0754)  Labs:  Recent Labs  06/07/17 0440 06/08/17 0420 06/09/17 0547  HGB 11.6* 11.5*  --   HCT 34.2* 34.3*  --   PLT 174 180  --   LABPROT 26.4* 27.9* 30.0*  INR 2.45 2.63 2.89  HEPARINUNFRC 0.38 <0.10*  --     Estimated Creatinine Clearance: 40.1 mL/min (by C-G formula based on SCr of 0.76 mg/dL).   Medical History: Past Medical History:  Diagnosis Date  . A-fib ([place])   . Anxiety   . Atrial fibrillation ([place])   . Cancer (HCC)    endometrial  . Depression   . Hypertension    Assessment: 83 y/o F with a known h/o atrial fibrillation and mechnical mitral valve on warfarin 4 mg daily PTA admitted with abdominal pain as well as intractable N/V. Pt warfarin was held and was given vit K to reduce INR for endoscopy. Admission INR therapeutic at 2.62.  Date INR Dose  8/30 2.62 HELD 8/31 3.1   HELD 9/1 3.96   HELD- Vit K 2.5mg  IV given bc pt needed procedure --> INR 1.51 9/2       1.47  6mg  9/3 1.44 HELD- patient may have received a 2nd dose on 9/2 9/4       2.88 HELD 9/5 1.83 6mg   9/6  1.63 6mg  9/7 1.56 8mg   9/8 1.96 5mg  9/9 2.45 2mg  9/10 2.63 4 mg 9/11 2.89    Goal of Therapy:  INR 2.5-3.5  Plan Warfarin:  INR therapeutic. Will continue home dose and follow INR. Warfarin 4 mg PO daily at 1800  ordered for tonight.  Will follow daily INR levels and order doses based on trend.  Hgb and Plt count has been stable.   Rocky Morel, PharmD, BCPS Clinical Pharmacist 06/09/2017 9:09 AM

## 2017-06-09 NOTE — Progress Notes (Signed)
CC: Abdominal pain Subjective: Patient denies any current abdominal pain. She has tolerated liquids overnight. States she has passed flatus.  Objective: Vital signs in last 24 hours: Temp:  [97.9 F (36.6 C)-98.6 F (37 C)] 98.5 F (36.9 C) (09/11 0754) Pulse Rate:  [76-91] 81 (09/11 0754) Resp:  [18] 18 (09/11 0754) BP: (108-127)/(48-59) 108/50 (09/11 0754) SpO2:  [93 %-96 %] 95 % (09/11 0754) Weight:  [57 kg (125 lb 11.2 oz)] 57 kg (125 lb 11.2 oz) (09/11 0344) Last BM Date: 06/06/17  Intake/Output from previous day: 09/10 0701 - 09/11 0700 In: 13 [I.V.:13] Out: 1000 [Urine:1000] Intake/Output this shift: No intake/output data recorded.  Physical exam:  Gen.: No acute distress Chest: Clear to auscultation Heart: Regular rhythm Abdomen: Soft, nontender, nondistended  Lab Results: CBC   Recent Labs  06/07/17 0440 06/08/17 0420  WBC 9.7 8.4  HGB 11.6* 11.5*  HCT 34.2* 34.3*  PLT 174 180   BMET No results for input(s): NA, K, CL, CO2, GLUCOSE, BUN, CREATININE, CALCIUM in the last 72 hours. PT/INR  Recent Labs  06/08/17 0420 06/09/17 0547  LABPROT 27.9* 30.0*  INR 2.63 2.89   ABG No results for input(s): PHART, HCO3 in the last 72 hours.  Invalid input(s): PCO2, PO2  Studies/Results: Ct Abdomen Pelvis Wo Contrast  Result Date: 06/08/2017 CLINICAL DATA:  Unspecified abdominal pain EXAM: CT ABDOMEN AND PELVIS WITHOUT CONTRAST TECHNIQUE: Multidetector CT imaging of the abdomen and pelvis was performed following the standard protocol without IV contrast. COMPARISON:  Abdominal MRI from 4 days ago.  Abdominal CT 05/28/2017 FINDINGS: Lower chest: Postoperative mitral valve. Aortic valvular calcification. Coronary atherosclerosis. Cardiomegaly. Mild scar-like appearance in the left posterior costophrenic sulcus. Hepatobiliary: No focal liver abnormality.Cholelithiasis. No evidence of biliary obstruction or inflammation. Pancreas: Atrophic.  Generalized atrophy.  No  acute finding Spleen: Unremarkable. Adrenals/Urinary Tract: Negative adrenals. Mild left hydronephrosis and hydroureter to the level of the pelvis. No visible obstructive process. Thickened appearance of the bladder with diverticulum at the right base. No urolithiasis. Stomach/Bowel: Re- demonstrated gas dilated stomach and proximal duodenum, with transition at the third portion duodenum. Abnormal mucosa in this area was biopsied recently. No evidence of perforation. No appendicitis. Distal colonic diverticulosis. Vascular/Lymphatic: No acute vascular abnormality. Diffuse atherosclerotic calcification. No mass or adenopathy. Reproductive:Hysterectomy.  Negative adnexae. Other: No ascites or pneumoperitoneum. Musculoskeletal: Advanced disc and facet degeneration with scoliosis. Osteopenia. IMPRESSION: 1. Obstruction at the third portion duodenum, also noted 05/28/2017. This area has been recently characterized by MRI and endoscopy/biopsy. 2. Stable left hydroureteronephrosis with transition at the pelvis where there is no visible obstructive process. 3. Bladder wall thickening and diverticulum suggesting chronic outlet obstruction. 4. Cholelithiasis. Electronically Signed   By: Monte Fantasia M.D.   On: 06/08/2017 12:25   Dg Abd Portable 2v  Result Date: 06/09/2017 CLINICAL DATA:  Abdominal pain. EXAM: PORTABLE ABDOMEN - 2 VIEW COMPARISON:  CT 06/08/2017. FINDINGS: Prior median sternotomy. Cardiomegaly. Soft tissue structures of the abdomen are unremarkable. Gastric distention again noted. Stool and oral contrast noted throughout the colon. Vascular calcification noted. Degenerative changes lumbar spine and both hips. Scoliosis lumbar spine. IMPRESSION: Gastric distention again noted. Reference is made to prior CT report 06/08/2017. Colonic gas pattern is normal. Stool in oral contrast noted in the colon. Electronically Signed   By: Marcello Moores  Register   On: 06/09/2017 06:35    Anti-infectives: Anti-infectives     None      Assessment/Plan:  83 year old female with abdominal pain related to  gastritis and duodenitis. Distended stomach on x-ray but contrast is consumed yesterday is in her colon. This confirms that there is no complete mechanical obstruction. She may have some semblance of gastric outlet obstruction or gastroparesis related to the inflammation seen on imaging and endoscopy. Discussed with the family that there is no current surgical indication for this patient. Could consider NG tube for comfort should the gastric distention continue. Could also consider gastric promotility agents to assist with her gastric distention. Discussed with the patient and her son that I would defer the medical management to the internal medicine team as those medications also can have cardiac side effects. Mostly I reassured the patient and her son that no urgent surgical intervention is required. If patient's nutritional intake continues to be marginal may need to investigate supplementing with either parenteral or postpyloric supplementation.  Surabhi Gadea T. Adonis Huguenin, MD, [facility] General Surgeon [facility]  [facility] [phone] Night [facility] [phone] 06/09/2017

## 2017-06-09 NOTE — Progress Notes (Signed)
Nutrition Follow-up  DOCUMENTATION CODES:   Not applicable  INTERVENTION:  1. Continue Ensure Enlive po BID, each supplement provides 350 kcal and 20 grams of protein  2. If Patient is unable to tolerate full liquids, begin Boost Breeze po TID, each supplement provides 250 kcal and 9 grams of protein  NUTRITION DIAGNOSIS:   Inadequate oral intake related to poor appetite, nausea, vomiting as evidenced by per patient/family report. -ongoing  GOAL:   Patient will meet greater than or equal to 90% of their needs -not meeting  MONITOR:   PO intake, Supplement acceptance, Labs, Weight trends, I & O's  ASSESSMENT:   83 yo female with PMHHx HTN, HLD, Endometrial cancer duodenal obstruction, AKI, L-sided hydronephrosis and hydroureter. Pt s/p EGD and found to have a severely edematous distal stomach with a functional obstruction. EUS and MRCP being considered.   Spoke with son at bedside. Patient had recently vomited upon my visit. Discussed with Son nutrition options moving forward to maximize PO intake. She has been tolerating ensure but anytime she eats solid food seems she vomits. Exhibits an 8#/6% severe weight loss over 12 days. Possible discharge to [facility] tomorrow. Will coordinate care as needed.  Labs reviewed  Medications reviewed and include:  Colace, Miralax  Meal Completion: 0-100% Seems to vary by the day.    Intake/Output Summary (Last 24 hours) at 06/09/17 1155 Last data filed at 06/09/17 1018  Gross per 24 hour  Intake              373 ml  Output             1000 ml  Net             -627 ml  Fluid negative 8.2L   Diet Order:  DIET SOFT Room service appropriate? Yes; Fluid consistency: Thin  Skin:  Reviewed, no issues  Last BM:  06/06/2017  Height:   Ht Readings from Last 1 Encounters:  05/28/17 5' 2.5" (1.588 m)    Weight:   Wt Readings from Last 1 Encounters:  06/09/17 125 lb 11.2 oz (57 kg)    Ideal Body Weight:  51.13 kg  BMI:  Body mass  index is 22.62 kg/m.  Estimated Nutritional Needs:   Kcal:  1400-1600 calories Protein:  74-93 grams (1.2-1.5g/kg)  Fluid:  > 1.5L  EDUCATION NEEDS:   No education needs identified at this time  Satira Anis. Nicholaus Steinke, MS, RD LDN Inpatient Clinical Dietitian Pager [phone]

## 2017-06-10 ENCOUNTER — Inpatient Hospital Stay: Payer: Medicare Other

## 2017-06-10 LAB — CBC
HCT: 36 % (ref 35.0–47.0)
Hemoglobin: 12.2 g/dL (ref 12.0–16.0)
MCH: 32.9 pg (ref 26.0–34.0)
MCHC: 33.9 g/dL (ref 32.0–36.0)
MCV: 97.1 fL (ref 80.0–100.0)
PLATELETS: 205 10*3/uL (ref 150–440)
RBC: 3.7 MIL/uL — AB (ref 3.80–5.20)
RDW: 14.7 % — ABNORMAL HIGH (ref 11.5–14.5)
WBC: 11.5 10*3/uL — AB (ref 3.6–11.0)

## 2017-06-10 LAB — PROTIME-INR
INR: 3.65
Prothrombin Time: 36 seconds — ABNORMAL HIGH (ref 11.4–15.2)

## 2017-06-10 MED ORDER — SODIUM CHLORIDE 0.9% FLUSH: 10.0000 mL | INTRAVENOUS | Status: DC | PRN

## 2017-06-10 MED ORDER — SODIUM CHLORIDE 0.9% FLUSH
10.0000 mL | Freq: Two times a day (BID) | INTRAVENOUS | Status: DC
Start: 2017-06-10 — End: 2017-06-12
  Administered 2017-06-10 – 2017-06-11 (×3): 10 mL

## 2017-06-10 NOTE — Progress Notes (Signed)
[facility] for Warfarin Indication: atrial fibrillation  Allergies  Allergen Reactions  . Codeine Sulfate     Other reaction(s): Unknown  . Diphenhydramine Other (See Comments)    skin became red all over  . Ivp Dye [Iodinated Diagnostic Agents] Other (See Comments)    Skin becomes red all over  . Quinidine     Other reaction(s): Unknown Other reaction(s): Unknown    Patient Measurements: Height: 5' 2.5" (158.8 cm) Weight: 126 lb 3.2 oz (57.2 kg) IBW/kg (Calculated) : 51.25  Heparin DW: 60 kg  Vital Signs: Temp: 98.8 F (37.1 C) (09/12 0415) Temp Source: Oral (09/12 0415) BP: 106/51 (09/12 0415) Pulse Rate: 63 (09/12 0415)  Labs:  Recent Labs  06/08/17 0420 06/09/17 0547 06/10/17 0447  HGB 11.5*  --   --   HCT 34.3*  --   --   PLT 180  --   --   LABPROT 27.9* 30.0* 36.0*  INR 2.63 2.89 3.65  HEPARINUNFRC <0.10*  --   --     Estimated Creatinine Clearance: 40.1 mL/min (by C-G formula based on SCr of 0.76 mg/dL).   Medical History: Past Medical History:  Diagnosis Date  . A-fib ([place])   . Anxiety   . Atrial fibrillation ([place])   . Cancer ([facility])    endometrial  . Depression   . Hypertension    Assessment: 83 y/o F with a known h/o atrial fibrillation and mechnical mitral valve on warfarin 4 mg daily PTA admitted with abdominal pain as well as intractable N/V. Pt warfarin was held and was given vit K to reduce INR for endoscopy. Admission INR therapeutic at 2.62.  Date INR Dose  8/30 2.62 HELD 8/31 3.1   HELD 9/1 3.96   HELD- Vit K 2.5mg  IV given bc pt needed procedure --> INR 1.51 9/2       1.47  6mg  9/3 1.44 HELD- patient may have received a 2nd dose on 9/2 9/4       2.88 HELD 9/5 1.83 6mg   9/6  1.63 6mg  9/7 1.56 8mg   9/8 1.96 5mg  9/9 2.45 2mg  9/10 2.63 4 mg 9/11 2.89     4mg  9/12     3.65     HELD    Goal of Therapy:  INR 2.5-3.5  Plan Warfarin:  INR therapeutic. Will continue home dose and follow  INR. Warfarin 4 mg PO daily at 1800 ordered for tonight.  Will follow daily INR levels and order doses based on trend.  Hgb and Plt count has been stable.   9/12@ 1118, INR has continued trending upward to 3.65. Will hold tonight dose and reascess tomorrow.  CBC ordered for monitoring Hgb and Plt.   Thomasenia Sales, PharmD, BCPS Clinical Pharmacist 06/10/2017 11:18 AM

## 2017-06-10 NOTE — Progress Notes (Addendum)
Patient ID: Crystal Landry, female   DOB: 11-Jun-1927, 83 y.o.   MRN: [id number]    [facility] PROGRESS NOTE  Crystal Landry VZC:[id number] DOB: [birth year] DOA: 05/28/2017 PCP: [name], MD  HPI/Subjective: Was nauseous again yesterday  had some episodes of emesis  Objective: Vitals:   06/09/17 1933 06/10/17 0415  BP: 117/75 (!) 106/51  Pulse: 85 63  Resp: 17 17  Temp: 98.4 F (36.9 C) 98.8 F (37.1 C)  SpO2: 93% 96%    Filed Weights   06/08/17 0345 06/09/17 0344 06/10/17 0415  Weight: 124 lb 1.9 oz (56.3 kg) 125 lb 11.2 oz (57 kg) 126 lb 3.2 oz (57.2 kg)    ROS: Review of Systems  Constitutional: Negative for chills and fever.  Eyes: Negative for blurred vision.  Respiratory: Negative for cough and shortness of breath.   Cardiovascular: Negative for chest pain.  Gastrointestinal: Negative for abdominal pain, constipation, diarrhea, nausea and vomiting.  Genitourinary: Negative for dysuria.  Musculoskeletal: Negative for joint pain.  Neurological: Negative for dizziness and headaches.   Exam: Physical Exam  Constitutional: She is oriented to person, place, and time.  HENT:  Nose: No mucosal edema.  Mouth/Throat: No oropharyngeal exudate or posterior oropharyngeal edema.  Eyes: Pupils are equal, round, and reactive to light. Conjunctivae, EOM and lids are normal.  Neck: No JVD present. Carotid bruit is not present. No edema present. No thyroid mass and no thyromegaly present.  Cardiovascular: S1 normal and S2 normal.  Exam reveals no gallop.   Murmur heard.  Systolic murmur is present with a grade of 4/6  Pulses:      Dorsalis pedis pulses are 2+ on the right side, and 2+ on the left side.  Metallic heart valve  Respiratory: No respiratory distress. She has no wheezes. She has no rhonchi. She has no rales.  GI: Soft. Bowel sounds are normal. There is no tenderness.  Musculoskeletal:       Right shoulder: She exhibits no swelling.   Lymphadenopathy:    She has no cervical adenopathy.  Neurological: She is alert and oriented to person, place, and time. No cranial nerve deficit.  Skin: Skin is warm. No rash noted. Nails show no clubbing.  Psychiatric: She has a normal mood and affect.      Data Reviewed: Basic Metabolic Panel:  Recent Labs Lab 06/04/17 0357  NA 141  K 4.3  CL 109  CO2 27  GLUCOSE 116*  BUN 22*  CREATININE 0.76  CALCIUM 8.4*  MG 1.6*  PHOS 2.9   Liver Function Tests: No results for input(s): AST, ALT, ALKPHOS, BILITOT, PROT, ALBUMIN in the last 168 hours. CBC:  Recent Labs Lab 06/05/17 0359 06/06/17 0626 06/07/17 0440 06/08/17 0420 06/10/17 1227  WBC 7.8 8.6 9.7 8.4 11.5*  HGB 11.5* 11.7* 11.6* 11.5* 12.2  HCT 33.0* 33.8* 34.2* 34.3* 36.0  MCV 97.5 97.4 96.0 96.1 97.1  PLT 133* 166 174 180 205      Scheduled Meds: . Barium Sulfate  900 mL Oral Once  . diltiazem  300 mg Oral Daily  . diltiazem  60 mg Oral Q8H  . docusate sodium  100 mg Oral BID  . feeding supplement (ENSURE ENLIVE)  237 mL Oral BID BM  . oxybutynin  5 mg Oral QHS  . pantoprazole (PROTONIX) IV  40 mg Intravenous Q12H  . polyethylene glycol  17 g Oral Daily  . simethicone  80 mg Oral QID  . simvastatin  20 mg  Oral q1800  . sodium chloride flush  10-40 mL Intracatheter Q12H  . sucralfate  1 g Oral TID WC & HS  . traZODone  25 mg Oral Once  . Warfarin - Pharmacist Dosing Inpatient   Does not apply q1800   Continuous Infusions:   Assessment/Plan:  1. Functional Bowel obstruction. Severe inflammation and esophagus stomach and duodenum.   Due to patient not improving I offered them transferred to [place] spoke to hospitalist there they state that we have same facilities here and does not meet criteria for transfer. At this point I will continue current regimen 2. Atrial fibrillation with elevated heart rate with activity.  Continue Cardizem CD 300 mg daily. INR  therapeutic continue Coumadin 3. Acute kidney injury. Improved 4. Left-sided hydronephrosis and hydroureter. Urology on call on admission recommended outpatient follow-up. This appears to be a chronic 5. Hyperlipidemia unspecified. continue simvastatin 6. Metallic heart valve. Coumadin dosing by pharmacy  7. Constipation. continued Colace 8. Disposition to skilled nursing facility patient will need to be requalified    I was asked by son to transfer the patient to [place] earlier they felt pt would not benefit from transfer - time spent on transfer process 71min I explained to son pt unable to transfer pt to [place].   He asked me to call [place] made call- transfer center states no bed at [facility] transfer center- time spent 76min  Then I was told to call Dr. Windell Norfolk at [facility] to accept the patient . Dr. Windell Norfolk referred pt to transfer [facility] I called [facility]  Time spent addition 45 min.        Code Status:     Code Status Orders        Start     Ordered   05/28/17 1732  Full code  Continuous     05/28/17 1732    Code Status History    Date Active Date Inactive Code Status Order ID Comments User Context   This patient has a current code status but no historical code status.    Advance Directive Documentation     Most Recent Value  Type of Advance Directive  Healthcare Power of Attorney  Pre-existing out of facility DNR order (yellow form or pink MOST form)  -  "MOST" Form in Place?  -     Family Communication: Son at the bedside Disposition Plan: likely will need rehabilitation for weakness.  Consultants:  Gastroenterology  Surgery  Cardiology  Time spent: more than hour and half spent on this patient care  [place], [facility]

## 2017-06-10 NOTE — Clinical Social Work Note (Signed)
CSW spoke to [employer] authorization is good till Friday.  Patient still would like to go to [facility], CSW updated SNF.  CSW to continue to follow patient's progress throughout discharge planning.  Jones Broom. [place], MSW, [place]  06/10/2017 6:20 PM

## 2017-06-10 NOTE — Progress Notes (Signed)
Called [facility] Talk to [employer] he states pt not candidate for transfer to [facility] on the presentation I have given him.

## 2017-06-10 NOTE — Progress Notes (Addendum)
I was asked by son to transfer the patient to [place] earlier they felt pt would not benefit from transfer - time spent on transfer process 63min I explained to son pt unable to transfer pt to [place].   He asked me to call [place] made call- transfer center states no bed at [facility] transfer center- time spent 18min  Then I was told to call Dr. Windell Norfolk at [facility] to accept the patient . Dr. Windell Norfolk referred pt to transfer center at [facility] I called [facility] I spoke to Dr. Ephriam Jenkins he accepts pt but due to bed sitiuation pt will not have bed until weekend.      Time spent addition 45 min.

## 2017-06-10 NOTE — Progress Notes (Signed)
Physical Therapy Treatment Patient Details Name: Crystal Landry MRN: [id number] DOB: 03/31/1927 Today's Date: 06/10/2017    History of Present Illness [name] here with a-fib/RVR and weakness. Patient has PMH significant for endometrial CA, depression, HTN, abdominal pain; CT scan showed possible blockage in esophagus. Patient underwent EGD on 9/3 for GI bleed.     PT Comments    Pt is making good progress towards goals. Pt is able to perform bed mobility with mod I., transfers and amb with RW with CGA. Once seated at EOB, pt wanted to amb to bathroom, then amb one lap around nurse's station, no reports of nausea or need to stop. Pt performed supine there-ex. Pt appeared willing to try PT activities and participate in session. Family members were present throughout entire session, spoke extensively about PT recommendations and DC options. Brought in another PT to the discussion, and then brought in caseworker to discuss further concerns from the family members.    Follow Up Recommendations  Home health PT     Equipment Recommendations  Rolling walker with 5" wheels    Recommendations for Other Services       Precautions / Restrictions Precautions Precautions: Fall Restrictions Weight Bearing Restrictions: No    Mobility  Bed Mobility Overal bed mobility: Modified Independent Bed Mobility: Supine to Sit     Supine to sit: Modified independent (Device/Increase time) Sit to supine: Modified independent (Device/Increase time)   General bed mobility comments: Pt utilized bed rails  Transfers Overall transfer level: Needs assistance Equipment used: Rolling walker (2 wheeled) Transfers: Sit to/from Stand Sit to Stand: Min guard         General transfer comment: Pt able to transfer from EOB to standing with RW and CGA, no cues needed.  Ambulation/Gait Ambulation/Gait assistance: Min guard Ambulation Distance (Feet): 250 Feet Assistive device: Rolling walker (2  wheeled) Gait Pattern/deviations: Step-through pattern     General Gait Details: Pt demonstrates reciprocal, alternating gait pattern. Pt able to guide the RW continuously. Maintains upright posture and forward gaze. Did not require any cues. Pt amb from bed to toilet, to hallway, amb one lap around nurse's station within pt's limits, and returned to bed. Provided option for pt to amb to or around nurses's station, pt decided to try to walk one lap around. Required CGA due to pt feeling fatigued.    Stairs            Wheelchair Mobility    Modified Rankin (Stroke Patients Only)       Balance Overall balance assessment: Needs assistance Sitting-balance support: Feet supported Sitting balance-Leahy Scale: Good Sitting balance - Comments: Pt able to sit EOB with CGA, no cues needed for positioning, monitored for nausea   Standing balance support: Bilateral upper extremity supported Standing balance-Leahy Scale: Good Standing balance comment: Pt able to stand with RW and CGA, monitored for nausea                            Cognition Arousal/Alertness: Awake/alert Behavior During Therapy: WFL for tasks assessed/performed Overall Cognitive Status: Within Functional Limits for tasks assessed                                        Exercises Other Exercises Other Exercises: Supine ther-ex performed 10x, B ankle pumps, SLRs, hip abd/add. Provided verbal cues for  proper technique.    General Comments        Pertinent Vitals/Pain Pain Assessment: No/denies pain    Home Living                      Prior Function            PT Goals (current goals can now be found in the care plan section) Progress towards PT goals: Progressing toward goals    Frequency    Min 2X/week      PT Plan Discharge plan needs to be updated    Co-evaluation              AM-PAC PT "6 Clicks" Daily Activity  Outcome Measure  Difficulty turning  over in bed (including adjusting bedclothes, sheets and blankets)?: None Difficulty moving from lying on back to sitting on the side of the bed? : None Difficulty sitting down on and standing up from a chair with arms (e.g., wheelchair, bedside commode, etc,.)?: Unable Help needed moving to and from a bed to chair (including a wheelchair)?: A Little Help needed walking in hospital room?: A Little Help needed climbing 3-5 steps with a railing? : A Little 6 Click Score: 18    End of Session Equipment Utilized During Treatment: Gait belt Activity Tolerance: Patient tolerated treatment well Patient left: in bed;with call bell/phone within reach (with physician in room) Nurse Communication: Mobility status PT Visit Diagnosis: Muscle weakness (generalized) (M62.81);Other abnormalities of gait and mobility (R26.89)     Time: 8657-8469 PT Time Calculation (min) (ACUTE ONLY): 30 min  Charges:                       G Codes:  Functional Assessment Tool Used: AM-PAC 6 Clicks Basic Mobility Functional Limitation: Mobility: Walking and moving around Mobility: Walking and Moving Around Current Status (G2952): At least 40 percent but less than 60 percent impaired, limited or restricted Mobility: Walking and Moving Around Goal Status [phone]): At least 20 percent but less than 40 percent impaired, limited or restricted    Manfred Arch, SPT   Manfred Arch 06/10/2017, 5:42 PM

## 2017-06-10 NOTE — Progress Notes (Signed)
Crystal Bellows MD, MRCP(U.K) [address]  [place]  [place], [place] [address]  Main: [phone]    Crystal Landry is being followed for vomiting , gastric outlet obstruction   Subjective: Not able to tolerate PO   Objective: Vital signs in last 24 hours: Vitals:   06/09/17 0754 06/09/17 1134 06/09/17 1933 06/10/17 0415  BP: (!) 108/50 128/60 117/75 (!) 106/51  Pulse: 81 74 85 63  Resp: 18 20 17 17   Temp: 98.5 F (36.9 C) 99.1 F (37.3 C) 98.4 F (36.9 C) 98.8 F (37.1 C)  TempSrc: Oral Oral Oral Oral  SpO2: 95% 98% 93% 96%  Weight:    126 lb 3.2 oz (57.2 kg)  Height:       Weight change: 8 oz (0.227 kg)  Intake/Output Summary (Last 24 hours) at 06/10/17 1444 Last data filed at 06/10/17 1026  Gross per 24 hour  Intake              180 ml  Output              800 ml  Net             -620 ml     Exam: Heart:: Regular rate and rhythm, S1S2 present or without murmur or extra heart sounds Lungs: normal, clear to auscultation and clear to auscultation and percussion Abdomen: mildly distended , no tenderness, BS+   Lab Results: @LABTEST2 @ Micro Results: No results found for this or any previous visit (from the past 240 hour(s)). Studies/Results: Dg Abd Portable 2v  Result Date: 06/09/2017 CLINICAL DATA:  Abdominal pain. EXAM: PORTABLE ABDOMEN - 2 VIEW COMPARISON:  CT 06/08/2017. FINDINGS: Prior median sternotomy. Cardiomegaly. Soft tissue structures of the abdomen are unremarkable. Gastric distention again noted. Stool and oral contrast noted throughout the colon. Vascular calcification noted. Degenerative changes lumbar spine and both hips. Scoliosis lumbar spine. IMPRESSION: Gastric distention again noted. Reference is made to prior CT report 06/08/2017. Colonic gas pattern is normal. Stool in oral contrast noted in the colon. Electronically Signed   By: Marcello Moores  Register   On: 06/09/2017 06:35   Medications: I have reviewed the patient's current  medications. Scheduled Meds: . Barium Sulfate  900 mL Oral Once  . diltiazem  300 mg Oral Daily  . diltiazem  60 mg Oral Q8H  . docusate sodium  100 mg Oral BID  . feeding supplement (ENSURE ENLIVE)  237 mL Oral BID BM  . oxybutynin  5 mg Oral QHS  . pantoprazole (PROTONIX) IV  40 mg Intravenous Q12H  . polyethylene glycol  17 g Oral Daily  . simethicone  80 mg Oral QID  . simvastatin  20 mg Oral q1800  . sodium chloride flush  10-40 mL Intracatheter Q12H  . sucralfate  1 g Oral TID WC & HS  . traZODone  25 mg Oral Once  . Warfarin - Pharmacist Dosing Inpatient   Does not apply q1800   Continuous Infusions: PRN Meds:.acetaminophen **OR** acetaminophen, alum & mag hydroxide-simeth, lidocaine, LORazepam, metoprolol tartrate, ondansetron **OR** ondansetron (ZOFRAN) IV, sodium chloride flush, temazepam   Assessment: Active Problems:   Atrial fibrillation with RVR (HCC)   Encounter for imaging study to confirm nasogastric (NG) tube placement   Abdominal pain  Crystal Landry a 83 y.o.y/o femaleadmitted with nausea, vomiting and diarrhea. CT scan concerning for duodenal obstruction.EGD with severely edematous distal stomach of unclear etiology with a functional obstruction. Duodenal inflammation in 3rd portion of the  duodenum with mild narrowing but no obstruction. No definite mass seen. Biopsies negative for malignancy /. Pain improved then recurred. CT scan showed recurrence of duodenal obstruction with no obvious mass. Repeat X ray shows the contrast in the colon suggesting that she has ongoing motility ,H pylori stool antigen is negative . [facility] didn't accept transfer . Not able to tolerate anything PO. Long discussion with 2 sons of over 30 mins discussing options of [place] enteral tube vs TPN . She is not interetested in any tube feeds and is ok with TPN. I explained she can be on TPN and we can wait for the gastric and duodenal inflammation to resolve, she can even see [facility] as an  outpatient with TPN. As an outpatient in probably 2 -3 weeks may benefit from a MR enterogram to evaluate the duodenal area to see if the inflammation has resolved. Other options to consider would be a duodenal stent as an outpatient   I have discussed the plan with Dr Posey Pronto to arrange for TPN    LOS: 13 days   Crystal Landry 06/10/2017, 2:44 PM

## 2017-06-10 NOTE — Progress Notes (Signed)
       [place]  SUBJECTIVE: still with abdominal discomfort. Afib rate is still variable, controlled at rest but somewhat rapid with activity.    Vitals:   06/09/17 0754 06/09/17 1134 06/09/17 1933 06/10/17 0415  BP: (!) 108/50 128/60 117/75 (!) 106/51  Pulse: 81 74 85 63  Resp: 18 20 17 17   Temp: 98.5 F (36.9 C) 99.1 F (37.3 C) 98.4 F (36.9 C) 98.8 F (37.1 C)  TempSrc: Oral Oral Oral Oral  SpO2: 95% 98% 93% 96%  Weight:    57.2 kg (126 lb 3.2 oz)  Height:        Intake/Output Summary (Last 24 hours) at 06/10/17 1313 Last data filed at 06/10/17 1026  Gross per 24 hour  Intake              180 ml  Output              800 ml  Net             -620 ml    LABS: Basic Metabolic Panel: No results for input(s): NA, K, CL, CO2, GLUCOSE, BUN, CREATININE, CALCIUM, MG, PHOS in the last 72 hours. Liver Function Tests: No results for input(s): AST, ALT, ALKPHOS, BILITOT, PROT, ALBUMIN in the last 72 hours. No results for input(s): LIPASE, AMYLASE in the last 72 hours. CBC:  Recent Labs  06/08/17 0420 06/10/17 1227  WBC 8.4 11.5*  HGB 11.5* 12.2  HCT 34.3* 36.0  MCV 96.1 97.1  PLT 180 205   Cardiac Enzymes: No results for input(s): CKTOTAL, CKMB, CKMBINDEX, TROPONINI in the last 72 hours. BNP: Invalid input(s): POCBNP D-Dimer: No results for input(s): DDIMER in the last 72 hours. Hemoglobin A1C: No results for input(s): HGBA1C in the last 72 hours. Fasting Lipid Panel: No results for input(s): CHOL, HDL, LDLCALC, TRIG, CHOLHDL, LDLDIRECT in the last 72 hours. Thyroid Function Tests: No results for input(s): TSH, T4TOTAL, T3FREE, THYROIDAB in the last 72 hours.  Invalid input(s): FREET3 Anemia Panel: No results for input(s): VITAMINB12, FOLATE, FERRITIN, TIBC, IRON, RETICCTPCT in the last 72 hours.   Physical Exam: Blood pressure (!) 106/51, pulse 63, temperature 98.8 F (37.1 C), temperature source Oral, resp.  rate 17, height 5' 2.5" (1.588 m), weight 57.2 kg (126 lb 3.2 oz), SpO2 96 %.   Wt Readings from Last 1 Encounters:  06/10/17 57.2 kg (126 lb 3.2 oz)     General appearance: cooperative Resp: clear to auscultation bilaterally Cardio: irregularly irregular rhythm GI: abnormal findings:  mild tenderness in the lower abdomen Extremities: extremities normal, atraumatic, no cyanosis or edema Neurologic: Grossly normal  TELEMETRY: Reviewed telemetry pt in afib with variable vr:  ASSESSMENT AND PLAN:  Active Problems:   Atrial fibrillation with RVR (HCC)-will attempt to control rate. Still up with activity. Driven by abdominal pain   Encounter for imaging study to confirm nasogastric (NG) tube placement   Abdominal pain-still with diffiulty with abdominal pain. Being follow by gi and general surgery.     Teodoro Spray, MD, [facility] 06/10/2017 1:13 PM

## 2017-06-10 NOTE — Care Management (Signed)
Patient was evaluated by physical therapy today and  functionally patient met criteria for home health rather than skilled nursing.  Patient's sons Remo Lipps and Octavia Bruckner  were very upset about this recommendation and told CM "there is no way she is ready to go home."  CM discussed that even though patient had improved strength and endurance today, there is more to be considered regarding skilled nursing placement.  The insurance company would receive documentation that reflects all aspects of medical status.  Patient still requires daily and ongoing nursing assessments throughout the day. She would not be able to return to her home and live independently.  Discussed that it is still up to the insurance to the insurance company to approve or deny the request for skilled nursing.  Patient is to have PICC line placed and  to restart TPN rather than nasogastric tube feedings. Sons are persistent in request for second opinion regarding gi status.  Dr Vicente Males has discussed with sons some outpatient options for gi follow up. They would consider going to [facility] or [facility] if accepted.   Attending will discuss this with sons tomorrow.  CM spoke with Remo Lipps about LTAC level of care.. Stressed that again, that insurance company would have to approve. Agreeable for CM to reach out to liaisons.

## 2017-06-10 NOTE — Care Management (Signed)
Barrier to discharge- back on clear liquids.  Transfer to [facility]- Dr Corliss Blacker- was declined by facility.  GI recommending patient should be without vomiting for 24 hours before discharging. The inflammation in the intestine must resolve/improve before patient will be able to tolerate normal diet.  Surgery has re consulted and there is no surgical intervention.   Discussed the need for close monitoring and documentation of intake and episodes of emesis during progression.  Albumin check 9/3- 3.1. Documented weights are are erratic but does appear could have had a weight loss since admission 8/30- wt was 133.  Today's weight is 126. Reached out to attending to discuss care options- would it be of benefit to assess for ltac.

## 2017-06-10 NOTE — Progress Notes (Signed)
Peripherally Inserted Central Catheter/Midline Placement  The IV Nurse has discussed with the patient and/or persons authorized to consent for the patient, the purpose of this procedure and the potential benefits and risks involved with this procedure.  The benefits include less needle sticks, lab draws from the catheter, and the patient may be discharged home with the catheter. Risks include, but not limited to, infection, bleeding, blood clot (thrombus formation), and puncture of an artery; nerve damage and irregular heartbeat and possibility to perform a PICC exchange if needed/ordered by physician.  Alternatives to this procedure were also discussed.  Bard Power PICC patient education guide, fact sheet on infection prevention and patient information card has been provided to patient /or left at bedside.    PICC/Midline Placement Documentation  PICC Double Lumen 41/74/08 PICC Right Basilic 37 cm 0 cm (Active)  Indication for Insertion or Continuance of Line Administration of hyperosmolar/irritating solutions (i.e. TPN, Vancomycin, etc.) 06/10/2017  8:50 PM  Exposed Catheter (cm) 0 cm 06/10/2017  8:50 PM  Site Assessment Clean;Dry;Intact 06/10/2017  8:50 PM  Lumen #1 Status Flushed;Saline locked;Blood return noted 06/10/2017  8:50 PM  Lumen #2 Status Flushed;Saline locked;Blood return noted 06/10/2017  8:50 PM  Dressing Type Transparent 06/10/2017  8:50 PM  Dressing Status Clean;Dry;Intact 06/10/2017  8:50 PM  Dressing Change Due 06/17/17 06/10/2017  8:50 PM       Gordan Payment 06/10/2017, 8:51 PM

## 2017-06-11 ENCOUNTER — Ambulatory Visit (HOSPITAL_COMMUNITY)
Admission: AD | Admit: 2017-06-11 | Discharge: 2017-06-11 | Disposition: A | Discharge status: Home / Self Care | Payer: Medicare Other | Source: Other Acute Inpatient Hospital | Attending: Internal Medicine | Admitting: Internal Medicine

## 2017-06-11 ENCOUNTER — Inpatient Hospital Stay: Payer: Medicare Other

## 2017-06-11 DIAGNOSIS — R109 Unspecified abdominal pain: Secondary | ICD-10-CM | POA: Insufficient documentation

## 2017-06-11 LAB — PROTIME-INR
INR: 3.76
Prothrombin Time: 36.9 seconds — ABNORMAL HIGH (ref 11.4–15.2)

## 2017-06-11 LAB — PHOSPHORUS: Phosphorus: 3.4 mg/dL (ref 2.5–4.6)

## 2017-06-11 LAB — GLUCOSE, CAPILLARY: Glucose-Capillary: 108 mg/dL — ABNORMAL HIGH (ref 65–99)

## 2017-06-11 LAB — POTASSIUM: Potassium: 3.9 mmol/L (ref 3.5–5.1)

## 2017-06-11 LAB — MAGNESIUM: Magnesium: 1.7 mg/dL (ref 1.7–2.4)

## 2017-06-11 MED ORDER — FAT EMULSION 20 % IV EMUL
250.0000 mL | INTRAVENOUS | Status: DC
  Administered 2017-06-11: 250 mL via INTRAVENOUS
  Filled 2017-06-11: qty 250

## 2017-06-11 MED ORDER — DEXTROSE IN LACTATED RINGERS 5 % IV SOLN
INTRAVENOUS | Status: AC
  Administered 2017-06-11: 13:00:00 via INTRAVENOUS

## 2017-06-11 MED ORDER — LORAZEPAM 0.5 MG PO TABS
0.5000 mg | ORAL_TABLET | Freq: Once | ORAL | Status: AC
  Administered 2017-06-11: 0.5 mg via ORAL
  Filled 2017-06-11: qty 1

## 2017-06-11 MED ORDER — TRACE MINERALS CR-CU-MN-SE-ZN 10-1000-500-60 MCG/ML IV SOLN
INTRAVENOUS | Status: DC
  Administered 2017-06-11: 18:00:00 via INTRAVENOUS
  Filled 2017-06-11: qty 2000

## 2017-06-11 NOTE — Progress Notes (Signed)
Patient ID: Crystal Landry, female   DOB: Jan 22, 1927, 83 y.o.   MRN: [id number]    [place] PROGRESS NOTE  Crystal Landry YQM:[id number] DOB: Sep 09, 1927 DOA: 05/28/2017 PCP: Crystal Harrier, MD  HPI/Subjective: Patient not able to take much by mouth in Accepted to [facility] awaiting transfer  Objective: Vitals:   06/11/17 0437 06/11/17 1236  BP: (!) 104/57 (!) 113/49  Pulse: 61 80  Resp: 18   Temp: 98.3 F (36.8 C) 98.2 F (36.8 C)  SpO2: 95% 94%    Filed Weights   06/09/17 0344 06/10/17 0415 06/11/17 0500  Weight: 125 lb 11.2 oz (57 kg) 126 lb 3.2 oz (57.2 kg) 131 lb 3.2 oz (59.5 kg)    ROS: Review of Systems  Constitutional: Negative for chills and fever.  Eyes: Negative for blurred vision.  Respiratory: Negative for cough and shortness of breath.   Cardiovascular: Negative for chest pain.  Gastrointestinal: Negative for abdominal pain, constipation, diarrhea, nausea and vomiting.  Genitourinary: Negative for dysuria.  Musculoskeletal: Negative for joint pain.  Neurological: Negative for dizziness and headaches.   Exam: Physical Exam  Constitutional: She is oriented to person, place, and time.  HENT:  Nose: No mucosal edema.  Mouth/Throat: No oropharyngeal exudate or posterior oropharyngeal edema.  Eyes: Pupils are equal, round, and reactive to light. Conjunctivae, EOM and lids are normal.  Neck: No JVD present. Carotid bruit is not present. No edema present. No thyroid mass and no thyromegaly present.  Cardiovascular: S1 normal and S2 normal.  Exam reveals no gallop.   Murmur heard.  Systolic murmur is present with a grade of 4/6  Pulses:      Dorsalis pedis pulses are 2+ on the right side, and 2+ on the left side.  Metallic heart valve  Respiratory: No respiratory distress. She has no wheezes. She has no rhonchi. She has no rales.  GI: Soft. Bowel sounds are normal. There is no tenderness.  Musculoskeletal:       Right shoulder: She exhibits no  swelling.  Lymphadenopathy:    She has no cervical adenopathy.  Neurological: She is alert and oriented to person, place, and time. No cranial nerve deficit.  Skin: Skin is warm. No rash noted. Nails show no clubbing.  Psychiatric: She has a normal mood and affect.      Data Reviewed: Basic Metabolic Panel: No results for input(s): NA, K, CL, CO2, GLUCOSE, BUN, CREATININE, CALCIUM, MG, PHOS in the last 168 hours. Liver Function Tests: No results for input(s): AST, ALT, ALKPHOS, BILITOT, PROT, ALBUMIN in the last 168 hours. CBC:  Recent Labs Lab 06/05/17 0359 06/06/17 0626 06/07/17 0440 06/08/17 0420 06/10/17 1227  WBC 7.8 8.6 9.7 8.4 11.5*  HGB 11.5* 11.7* 11.6* 11.5* 12.2  HCT 33.0* 33.8* 34.2* 34.3* 36.0  MCV 97.5 97.4 96.0 96.1 97.1  PLT 133* 166 174 180 205      Scheduled Meds: . Barium Sulfate  900 mL Oral Once  . diltiazem  300 mg Oral Daily  . diltiazem  60 mg Oral Q8H  . docusate sodium  100 mg Oral BID  . feeding supplement (ENSURE ENLIVE)  237 mL Oral BID BM  . oxybutynin  5 mg Oral QHS  . pantoprazole (PROTONIX) IV  40 mg Intravenous Q12H  . polyethylene glycol  17 g Oral Daily  . simethicone  80 mg Oral QID  . simvastatin  20 mg Oral q1800  . sodium chloride flush  10-40 mL Intracatheter Q12H  .  sodium chloride flush  10-40 mL Intracatheter Q12H  . sucralfate  1 g Oral TID WC & HS  . traZODone  25 mg Oral Once  . Warfarin - Pharmacist Dosing Inpatient   Does not apply q1800   Continuous Infusions: . dextrose 5% lactated ringers 50 mL/hr at 06/11/17 1237  . Marland KitchenTPN (CLINIMIX-E) Adult     And  . fat emulsion      Assessment/Plan:  1. Functional Bowel obstruction. Severe inflammation and esophagus stomach and duodenum.   Due to patient not improving, per GIs recommendation patient started on TPN, elevating TPN to start this evening which is the standards time son is upset about patient not starting her TPN earlier. 2. Atrial fibrillation with elevated  heart rate with activity.  Continue Cardizem CD 300 mg daily. INR therapeutic continue Coumadin 3. Acute kidney injury. Improved 4. Left-sided hydronephrosis and hydroureter. Urology on call on admission recommended outpatient follow-up. This appears to be a chronic 5. Hyperlipidemia unspecified. continue simvastatin 6. Metallic heart valve. Coumadin dosing by pharmacy  7. Constipation. continued Colace 8. Disposition to skilled nursing facility patient will need to be requalified    Patient awaiting transfer to [facility]    Code Status:     Code Status Orders        Start     Ordered   05/28/17 1732  Full code  Continuous     05/28/17 1732    Code Status History    Date Active Date Inactive Code Status Order ID Comments User Context   This patient has a current code status but no historical code status.    Advance Directive Documentation     Most Recent Value  Type of Advance Directive  Healthcare Power of Attorney  Pre-existing out of facility DNR order (yellow form or pink MOST form)  -  "MOST" Form in Place?  -     Family Communication: Son at the bedside Disposition Plan: likely will need rehabilitation for weakness.  Consultants:  Gastroenterology  Surgery  Cardiology  Time spent: 37min Crystal Landry, [facility]

## 2017-06-11 NOTE — Progress Notes (Signed)
Phone call received from [facility] transfer center of bed availability.  Will notify MD for completion of transfer process.

## 2017-06-11 NOTE — Progress Notes (Signed)
[facility] for Warfarin Indication: atrial fibrillation  Allergies  Allergen Reactions  . Codeine Sulfate     Other reaction(s): Unknown  . Diphenhydramine Other (See Comments)    skin became red all over  . Ivp Dye [Iodinated Diagnostic Agents] Other (See Comments)    Skin becomes red all over  . Quinidine     Other reaction(s): Unknown Other reaction(s): Unknown    Patient Measurements: Height: 5' 2.5" (158.8 cm) Weight: 131 lb 3.2 oz (59.5 kg) IBW/kg (Calculated) : 51.25  Heparin DW: 60 kg  Vital Signs: Temp: 98.3 F (36.8 C) (09/13 0437) Temp Source: Oral (09/13 0437) BP: 104/57 (09/13 0437) Pulse Rate: 61 (09/13 0437)  Labs:  Recent Labs  06/09/17 0547 06/10/17 0447 06/10/17 1227 06/11/17 0402  HGB  --   --  12.2  --   HCT  --   --  36.0  --   PLT  --   --  205  --   LABPROT 30.0* 36.0*  --  36.9*  INR 2.89 3.65  --  3.76    Estimated Creatinine Clearance: 40.1 mL/min (by C-G formula based on SCr of 0.76 mg/dL).   Medical History: Past Medical History:  Diagnosis Date  . A-fib ([place])   . Anxiety   . Atrial fibrillation ([place])   . Cancer (HCC)    endometrial  . Depression   . Hypertension    Assessment: 83 y/o F with a known h/o atrial fibrillation and mechnical mitral valve on warfarin 4 mg daily PTA admitted with abdominal pain as well as intractable N/V. Pt warfarin was held and was given vit K to reduce INR for endoscopy. Admission INR therapeutic at 2.62.  Date INR Dose  8/30 2.62 HELD 8/31 3.1   HELD 9/1 3.96   HELD- Vit K 2.5mg  IV given bc pt needed procedure --> INR 1.51 9/2       1.47  6mg  9/3 1.44 HELD- patient may have received a 2nd dose on 9/2 9/4       2.88 HELD 9/5 1.83 6mg   9/6  1.63 6mg  9/7 1.56 8mg   9/8 1.96 5mg  9/9 2.45 2mg  9/10 2.63 4 mg 9/11 2.89     4mg  9/12     3.65     HELD 9/13     3.76     HELD   Goal of Therapy:  INR 2.5-3.5  Plan Warfarin:  INR therapeutic. Will  continue home dose and follow INR. Warfarin 4 mg PO daily at 1800 ordered for tonight.  Will follow daily INR levels and order doses based on trend.  Hgb and Plt count has been stable.   9/12@ 1118, INR has continued trending upward to 3.65. Will hold tonight dose and reascess tomorrow.  CBC ordered for monitoring Hgb and Plt.   9/13@0905 , INR still increasing, currently 3.76. Will hold tonight dose and expect trend down into therapeutic range tomorrow. CBC monitoring Hgb and Plt, following pharmacist to manage coumadin.   Thomasenia Sales, PharmD, MBA, [facility]    06/11/2017 9:05 AM

## 2017-06-11 NOTE — Progress Notes (Addendum)
Nutrition Follow-up  DOCUMENTATION CODES:   Not applicable  INTERVENTION:  Recommend begin Clinimix 5/15E via PICC at 80mL/hr, goal rate 60mL/hr 20% IVLE at 38mL/hr  Provides 1042 calories, 48 grams of protein, 939mL total volume At goal provides 1468 calories, 78 grams of protein, and 1535mL total volume  Recommend check K+, PO4, Mg - monitor for refeeding given multiple bouts of vomiting and decreased PO intake since coming off TPN the first time (9/5).  Continue Ensure Enlive po BID, each supplement provides 350 kcal and 20 grams of protein  NUTRITION DIAGNOSIS:   Inadequate oral intake related to poor appetite, nausea, vomiting as evidenced by per patient/family report. -ongoing  GOAL:   Patient will meet greater than or equal to 90% of their needs -not meeting  MONITOR:   PO intake, Supplement acceptance, Labs, Weight trends, I & O's  ASSESSMENT:   83 yo female with PMHHx HTN, HLD, Endometrial cancer duodenal obstruction, AKI, L-sided hydronephrosis and hydroureter. Pt s/p EGD and found to have a severely edematous distal stomach with a functional obstruction. EUS and MRCP being considered.   Patient continues to struggle with nausea/vomiting. GI had a long discussion with her 2 sons yesterday discussing enteral feeds vs TPN.  I personally would recommend a J-tube in this situation given the long-term risks associated with TPN, but sons and patient are refusing enteral feeds.  Using the GI system for enteral feeding helps prevent gut mucosal atrophy, reduces septic complications by decreasing bacterial translocation, stimulates gut motility therefore reducing the risk of ileus, and enhances the intestinal immune system.  Patient will have a hard time meeting her needs with clear liquids outside of TPN.  Discussed with MD, Pharmacy, RN  Access: R Double Lumen PICC  Meal Completion: 0-100% of clear liquids  Labs reviewed:  Mg 1.6 (9/6)  Medications reviewed and  include:  Colace, Miralax   Intake/Output Summary (Last 24 hours) at 06/11/17 1023 Last data filed at 06/10/17 2201  Gross per 24 hour  Intake              740 ml  Output              100 ml  Net              640 ml  8.4L fluid negative so far.  Diet Order:  Diet clear liquid Room service appropriate? Yes; Fluid consistency: Thin  Skin:  Reviewed, no issues  Last BM:  06/06/2017  Height:   Ht Readings from Last 1 Encounters:  05/28/17 5' 2.5" (1.588 m)    Weight:   Wt Readings from Last 1 Encounters:  06/11/17 131 lb 3.2 oz (59.5 kg)    Ideal Body Weight:  51.13 kg  BMI:  Body mass index is 23.61 kg/m.  Estimated Nutritional Needs:   Kcal:  1400-1600 calories  Protein:  74-93 grams (1.2-1.5g/kg)  Fluid:  > 1.5L  EDUCATION NEEDS:   No education needs identified at this time  Satira Anis. Mickey Hebel, MS, RD LDN Inpatient Clinical Dietitian Pager [phone]

## 2017-06-11 NOTE — Progress Notes (Signed)
[facility] here to transport to [facility]. Released with TPN/Lipids infusing via PICC.  Ativan po given per request as ordered.  Pt calm, cooperative at this time.  Son Richardson Landry made aware of transfer at this time.

## 2017-06-11 NOTE — Progress Notes (Signed)
Crystal Landry called  And made aware of pending transfer to [facility].  Belongings packed up for tranport.  Pt reports no bbelongings in hospital safe at this time.

## 2017-06-11 NOTE — Discharge Summary (Signed)
[place] at [facility], 83 y.o., DOB [birth year], MRN [id number]. Admission date: 05/28/2017 Discharge Date 06/11/2017 Primary MD [name], MD Admitting Physician Nicholes Mango, MD  Admission Diagnosis  Abdominal pain, unspecified abdominal location [R10.9] Non-intractable vomiting with nausea, unspecified vomiting type [R11.2]  Discharge Diagnosis   Active Problems: Abdominal pain with nausea and vomiting Functional Bowel obstruction Acute kidney injury Atrial fibrillation with RVR ([place]) Left-sided hydronephrosis and hydroureter   hyperlipidemia metallic heart valve H/o endometrial cancer anxiety depression    Hospital Course Crystal Landry  is a 82 y.o. female with a known history of Atrial fibrillation with RVR, endometrial cancer, depression, hypertension is presenting to the ED with a chief complaint of epigastric abdominal pain, nausea and vomiting which is intractable. CT abdomen has revealed  functional bowel obstruction, left-sided hydronephrosis and hydroureter. The ED physician has discussed with on-call surgery Dr. Burt Knack who has recommended medical admission and an surgical consult.     1.  Functional bowel obstruction.  We had to wait for the INR to come down. Since the INR was rising we had to give vitamin K 2.5 mg IV 1.INR came down enough for endoscopy on 05/31/2017.  Endoscopy shows severe inflammation esophagus stomach and duodenum. She was started on TPN and gradually advanced from liquid diet .  Biopsy results negative for cancers process. MRI of the abdomen did not show any specific mass but did show a 6 mm pancreatic cystwhich will need to be followed as outpatient. Pt continued to have nausea and abdominal pain had CT scan repeated showed similar obstruction in third portion of duodoneum. She was seen by surgery and recommend she may need intervention if no improvement. Pt was started on TPN. Family requested  tranfer to [facility] which is arranged.   2. Atrial fibrillation with rapid ventricular response. The patient needed Cardizem drip initially. Once able to tolerate diet she was switched over to oral Cardizem.  INR has been resistant to coming up secondary to vitamin K. Pharmacy dosing Coumadin.  3. Metallic heart valve.  Coumadin dosing by pharmacy.  4. Acute kidney injury improved with IV fluid hydration 5. Left-sided hydronephrosis and hydroureter. Urology was contacted on admission and they recommended outpatient follow-up. 6. Hyperlipidemia unspecified on simvastatin 7. Constipation Colace added 8. Urinary frequency at night Ditropan at night         Consults gi, surgery   Significant Tests:  See full reports for all details    Ct Abdomen Pelvis Wo Contrast  Result Date: 06/08/2017 CLINICAL DATA:  Unspecified abdominal pain EXAM: CT ABDOMEN AND PELVIS WITHOUT CONTRAST TECHNIQUE: Multidetector CT imaging of the abdomen and pelvis was performed following the standard protocol without IV contrast. COMPARISON:  Abdominal MRI from 4 days ago.  Abdominal CT 05/28/2017 FINDINGS: Lower chest: Postoperative mitral valve. Aortic valvular calcification. Coronary atherosclerosis. Cardiomegaly. Mild scar-like appearance in the left posterior costophrenic sulcus. Hepatobiliary: No focal liver abnormality.Cholelithiasis. No evidence of biliary obstruction or inflammation. Pancreas: Atrophic.  Generalized atrophy.  No acute finding Spleen: Unremarkable. Adrenals/Urinary Tract: Negative adrenals. Mild left hydronephrosis and hydroureter to the level of the pelvis. No visible obstructive process. Thickened appearance of the bladder with diverticulum at the right base. No urolithiasis. Stomach/Bowel: Re- demonstrated gas dilated stomach and proximal duodenum, with transition at the third portion duodenum. Abnormal mucosa in this area was biopsied recently. No evidence of perforation. No appendicitis. Distal  colonic diverticulosis. Vascular/Lymphatic: No acute vascular abnormality. Diffuse atherosclerotic calcification. No  mass or adenopathy. Reproductive:Hysterectomy.  Negative adnexae. Other: No ascites or pneumoperitoneum. Musculoskeletal: Advanced disc and facet degeneration with scoliosis. Osteopenia. IMPRESSION: 1. Obstruction at the third portion duodenum, also noted 05/28/2017. This area has been recently characterized by MRI and endoscopy/biopsy. 2. Stable left hydroureteronephrosis with transition at the pelvis where there is no visible obstructive process. 3. Bladder wall thickening and diverticulum suggesting chronic outlet obstruction. 4. Cholelithiasis. Electronically Signed   By: Monte Fantasia M.D.   On: 06/08/2017 12:25   Ct Abdomen Pelvis Wo Contrast  Result Date: 05/28/2017 CLINICAL DATA:  Abdominal pain, diarrhea and cramping for 2 months. EXAM: CT ABDOMEN AND PELVIS WITHOUT CONTRAST TECHNIQUE: Multidetector CT imaging of the abdomen and pelvis was performed following the standard protocol without IV contrast. COMPARISON:  CT scan 09/29/2014 FINDINGS: Lower chest: The lung bases are clear of acute process. Minimal basilar scarring changes. The heart is enlarged and there are it dense coronary artery calcifications and dense aortic valve and mitral valve annular calcifications. Hepatobiliary: No focal hepatic lesions or intrahepatic biliary dilatation. The gallbladder is mildly distended and small layering gallstones are noted. No pericholecystic inflammatory changes or gallbladder wall thickening to suggest acute cholecystitis. Normal caliber common bile duct for age. Pancreas: Moderate pancreatic atrophy but no mass, inflammation or ductal dilatation. Spleen: Normal size.  No focal lesions. Adrenals/Urinary Tract: The adrenal glands are unremarkable and stable. Mild left-sided hydronephrosis and proximal left hydroureter but no obstructing ureteral calculi are identified. Possible recent stone  passage and/or mild stricture. New line the right kidney is unremarkable. No renal or right ureteral calculi. Asymmetric bladder wall thickening appears relatively stable and could be due to chronic cystitis. I do not see a discrete mass. There is a right-sided diverticulum noted. Stomach/Bowel: Markedly distended stomach with fluid and air. The duodenum is also distended. Apparent wall thickening involving the third portion the duodenum could be an inflammatory process. I do not see a discrete mass but an infiltrating lesion is possible. Endoscopy may be necessary for further evaluation. No findings to suggest a mid distal small bowel obstruction. The terminal ileum appears normal. The appendix is normal except for a few small appendicoliths. Vascular/Lymphatic: Advanced vascular calcifications but no focal aneurysm. No mesenteric or retroperitoneal mass or lymphadenopathy. Small scattered lymph nodes appears stable. Reproductive: Surgically absent. Other: No pelvic mass or adenopathy. No free pelvic fluid collections. No inguinal mass or adenopathy. No abdominal wall hernia or subcutaneous lesions. Musculoskeletal: Advanced degenerative changes involving the spine but no acute bony findings or worrisome bone lesions. Diffuse osteoporosis. IMPRESSION: 1. Markedly distended stomach and duodenum with apparent wall thickening involving the third portion the duodenum. Could not exclude a functional obstruction due to inflammation. No discrete mass but infiltrating tumor is possible. Endoscopy may be helpful for further evaluation. 2. No findings for small bowel obstruction or free air. 3. Cholelithiasis without definite findings for acute cholecystitis. 4. Advanced atherosclerotic calcifications involving the aorta and branch vessels. 5. Mild left-sided hydronephrosis and proximal left hydroureter without obvious cause. 6. Moderate bladder wall thickening with some asymmetry but no discrete mass. This appears  relatively stable and could be due to chronic cystitis. Small right-sided bladder diverticulum noted. Electronically Signed   By: Marijo Sanes M.D.   On: 05/28/2017 13:49   Dg Chest 1 View  Result Date: 05/28/2017 CLINICAL DATA:  NG tube placement EXAM: CHEST 1 VIEW COMPARISON:  11/28/2015 FINDINGS: Post sternotomy changes. Esophageal tube extends below the diaphragm but the tip is non included. Elevation  of left diaphragm. Mild cardiomegaly. Aortic atherosclerosis. Negative for pneumothorax or pleural effusion IMPRESSION: Esophageal tube tip extends below diaphragm but tip is non included. Cardiomegaly. Electronically Signed   By: Donavan Foil M.D.   On: 05/28/2017 18:18   Dg Abd 1 View  Result Date: 05/28/2017 CLINICAL DATA:  Evaluate NG tube EXAM: ABDOMEN - 1 VIEW COMPARISON:  None. FINDINGS: The side port of the NG tube is just below the GE junction with the distal tip in the body of the stomach. IMPRESSION: The side port of the NG tube is just below the GE junction with the distal tip in the body of the stomach. Electronically Signed   By: Dorise Bullion III M.D   On: 05/28/2017 19:15   Mr 3d Recon At Scanner  Result Date: 06/04/2017 CLINICAL DATA:  Duodenal obstruction. EXAM: MRI ABDOMEN WITHOUT AND WITH CONTRAST (INCLUDING MRCP) TECHNIQUE: Multiplanar multisequence MR imaging of the abdomen was performed both before and after the administration of intravenous contrast. Heavily T2-weighted images of the biliary and pancreatic ducts were obtained, and three-dimensional MRCP images were rendered by post processing. CONTRAST:  68mL MULTIHANCE GADOBENATE DIMEGLUMINE 529 MG/ML IV SOLN COMPARISON:  CT scan 05/28/2017 FINDINGS: Lower chest:  Heart is enlarged. Hepatobiliary: Scattered tiny T2 hyperintense foci are noted in the liver. These are not well seen after IV contrast administration on T1 weighted imaging due to patient motion. Gallbladder is distended with tiny layering gallstones evident. No  intra or extrahepatic biliary duct dilatation. Extrahepatic common duct is upper normal to borderline increased at 6-7 mm diameter. Common bile duct in the head of the pancreas is 5 mm diameter. No evidence for choledocholithiasis. Pancreas: 6 mm cystic lesion in the body the pancreas has simple features on T2 weighted imaging but is not fully assess given the motion degradation on postcontrast T1 weighted imaging. Spleen: No gross abnormality. Adrenals/Urinary Tract: No adrenal nodule or mass. Similar appearance left hydronephrosis. No right hydronephrosis. Tiny bilateral T2 hyperintense renal lesions are likely cysts. Postcontrast imaging of the kidneys is markedly degraded by motion artifact. Stomach/Bowel: Stomach is distended although less so than on the prior CT scan. Duodenum distension persists. There is wall thickening in the transverse portion of the duodenum, not well seen on postcontrast imaging due to motion artifact. There may be some hyperenhancement in the anterior wall of the duodenum just proximal to the area of wall thickening, but given the motion artifact, this is not a definite finding. Vascular/Lymphatic: No abdominal aortic aneurysm. No evidence for abdominal lymphadenopathy on precontrast imaging. Other: No substantial intraperitoneal free fluid. Musculoskeletal: Thoracolumbar scoliosis evident. IMPRESSION: 1. Limited study secondary to motion artifact from patient inability to reproducibly breath hold. 2. Gastric distention is less prominent than on the recent CT scan with persistent wall thickening noted in the transverse duodenum. 3. Probable tiny hepatic and renal cysts, incompletely characterized. 4. Similar appearance of left-sided hydroureteronephrosis. 5. Cholelithiasis without evidence for choledocholithiasis. Electronically Signed   By: Misty Stanley M.D.   On: 06/04/2017 20:17   Dg Chest Port 1 View  Result Date: 06/10/2017 CLINICAL DATA:  Line placement. EXAM: PORTABLE CHEST  1 VIEW COMPARISON:  May 31, 2017 FINDINGS: Right subclavian central venous line is identified with distal tip in superior vena cava. There is no pneumothorax. Patient status post prior median sternotomy. The aorta is tortuous. The heart size is enlarged. There is no focal infiltrate, pulmonary edema, or pleural effusion. The osseous structures are unremarkable. IMPRESSION: Right central venous line is  identified with distal tip in superior vena cava. There is no pneumothorax. Electronically Signed   By: Abelardo Diesel M.D.   On: 06/10/2017 21:07   Dg Chest Port 1 View  Result Date: 05/31/2017 CLINICAL DATA:  PICC line placement EXAM: PORTABLE CHEST 1 VIEW COMPARISON:  05/28/2017 FINDINGS: RIGHT PICC line tip in distal SVC. Normal cardiac silhouette. No pulmonary edema. No pneumothorax IMPRESSION: RIGHT PICC line in good position Electronically Signed   By: Suzy Bouchard M.D.   On: 05/31/2017 18:45   Dg Abd Acute W/chest  Result Date: 06/11/2017 CLINICAL DATA:  Atrial fibrillation. Abdominal pain, nausea, vomiting. EXAM: DG ABDOMEN ACUTE W/ 1V CHEST COMPARISON:  06/10/2017 FINDINGS: Prior CABG. Heart is borderline in size. Lungs are clear. No effusions. Right PICC line tip is in the SVC, unchanged. Oral contrast material noted within the colon. Nonobstructive bowel gas pattern. No free air organomegaly. Degenerative changes and scoliosis in the lumbar spine. IMPRESSION: No evidence of bowel obstruction or free air.  No acute findings. No active cardiopulmonary disease. Electronically Signed   By: Rolm Baptise M.D.   On: 06/11/2017 09:50   Dg Abd Portable 2v  Result Date: 06/09/2017 CLINICAL DATA:  Abdominal pain. EXAM: PORTABLE ABDOMEN - 2 VIEW COMPARISON:  CT 06/08/2017. FINDINGS: Prior median sternotomy. Cardiomegaly. Soft tissue structures of the abdomen are unremarkable. Gastric distention again noted. Stool and oral contrast noted throughout the colon. Vascular calcification noted. Degenerative  changes lumbar spine and both hips. Scoliosis lumbar spine. IMPRESSION: Gastric distention again noted. Reference is made to prior CT report 06/08/2017. Colonic gas pattern is normal. Stool in oral contrast noted in the colon. Electronically Signed   By: Marcello Moores  Register   On: 06/09/2017 06:35   Mr Abdomen Mrcp W Wo Contast  Result Date: 06/04/2017 CLINICAL DATA:  Duodenal obstruction. EXAM: MRI ABDOMEN WITHOUT AND WITH CONTRAST (INCLUDING MRCP) TECHNIQUE: Multiplanar multisequence MR imaging of the abdomen was performed both before and after the administration of intravenous contrast. Heavily T2-weighted images of the biliary and pancreatic ducts were obtained, and three-dimensional MRCP images were rendered by post processing. CONTRAST:  38mL MULTIHANCE GADOBENATE DIMEGLUMINE 529 MG/ML IV SOLN COMPARISON:  CT scan 05/28/2017 FINDINGS: Lower chest:  Heart is enlarged. Hepatobiliary: Scattered tiny T2 hyperintense foci are noted in the liver. These are not well seen after IV contrast administration on T1 weighted imaging due to patient motion. Gallbladder is distended with tiny layering gallstones evident. No intra or extrahepatic biliary duct dilatation. Extrahepatic common duct is upper normal to borderline increased at 6-7 mm diameter. Common bile duct in the head of the pancreas is 5 mm diameter. No evidence for choledocholithiasis. Pancreas: 6 mm cystic lesion in the body the pancreas has simple features on T2 weighted imaging but is not fully assess given the motion degradation on postcontrast T1 weighted imaging. Spleen: No gross abnormality. Adrenals/Urinary Tract: No adrenal nodule or mass. Similar appearance left hydronephrosis. No right hydronephrosis. Tiny bilateral T2 hyperintense renal lesions are likely cysts. Postcontrast imaging of the kidneys is markedly degraded by motion artifact. Stomach/Bowel: Stomach is distended although less so than on the prior CT scan. Duodenum distension persists.  There is wall thickening in the transverse portion of the duodenum, not well seen on postcontrast imaging due to motion artifact. There may be some hyperenhancement in the anterior wall of the duodenum just proximal to the area of wall thickening, but given the motion artifact, this is not a definite finding. Vascular/Lymphatic: No abdominal aortic aneurysm. No  evidence for abdominal lymphadenopathy on precontrast imaging. Other: No substantial intraperitoneal free fluid. Musculoskeletal: Thoracolumbar scoliosis evident. IMPRESSION: 1. Limited study secondary to motion artifact from patient inability to reproducibly breath hold. 2. Gastric distention is less prominent than on the recent CT scan with persistent wall thickening noted in the transverse duodenum. 3. Probable tiny hepatic and renal cysts, incompletely characterized. 4. Similar appearance of left-sided hydroureteronephrosis. 5. Cholelithiasis without evidence for choledocholithiasis. Electronically Signed   By: Misty Stanley M.D.   On: 06/04/2017 20:17       Today   Subjective:   Crystal Landry conitnues to be nausead  Objective:   Blood pressure 105/61, pulse 85, temperature 98.3 F (36.8 C), temperature source Oral, resp. rate 18, height 5' 2.5" (1.588 m), weight 131 lb 3.2 oz (59.5 kg), SpO2 94 %.  .  Intake/Output Summary (Last 24 hours) at 06/11/17 2100 Last data filed at 06/11/17 1852  Gross per 24 hour  Intake              860 ml  Output              500 ml  Net              360 ml    Exam VITAL SIGNS: Blood pressure 105/61, pulse 85, temperature 98.3 F (36.8 C), temperature source Oral, resp. rate 18, height 5' 2.5" (1.588 m), weight 131 lb 3.2 oz (59.5 kg), SpO2 94 %.  GENERAL:  83 y.o.-year-old patient lying in the bed with no acute distress.  EYES: Pupils equal, round, reactive to light and accommodation. No scleral icterus. Extraocular muscles intact.  HEENT: Head atraumatic, normocephalic. Oropharynx and  nasopharynx clear.  NECK:  Supple, no jugular venous distention. No thyroid enlargement, no tenderness.  LUNGS: Normal breath sounds bilaterally, no wheezing, rales,rhonchi or crepitation. No use of accessory muscles of respiration.  CARDIOVASCULAR: S1, S2 normal. No murmurs, rubs, or gallops.  ABDOMEN: Soft, nontender, nondistended. Bowel sounds present. No organomegaly or mass.  EXTREMITIES: No pedal edema, cyanosis, or clubbing.  NEUROLOGIC: Cranial nerves II through XII are intact. Muscle strength 5/5 in all extremities. Sensation intact. Gait not checked.  PSYCHIATRIC: The patient is alert and oriented x 3.  SKIN: No obvious rash, lesion, or ulcer.   Data Review     CBC w Diff: Lab Results  Component Value Date   WBC 11.5 (H) 06/10/2017   HGB 12.2 06/10/2017   HGB 10.5 (L) 10/10/2014   HCT 36.0 06/10/2017   HCT 32.0 (L) 10/10/2014   PLT 205 06/10/2017   PLT 185 10/10/2014   LYMPHOPCT 3 06/01/2017   LYMPHOPCT 6.3 10/10/2014   MONOPCT 5 06/01/2017   MONOPCT 5.6 10/10/2014   EOSPCT 0 06/01/2017   EOSPCT 0.1 10/10/2014   BASOPCT 0 06/01/2017   BASOPCT 0.3 10/10/2014   CMP: Lab Results  Component Value Date   NA 141 06/04/2017   NA 145 10/10/2014   K 3.9 06/11/2017   K 3.9 10/10/2014   CL 109 06/04/2017   CL 113 (H) 10/10/2014   CO2 27 06/04/2017   CO2 26 10/10/2014   BUN 22 (H) 06/04/2017   BUN 15 10/10/2014   CREATININE 0.76 06/04/2017   CREATININE 0.82 10/10/2014   PROT 5.8 (L) 06/01/2017   PROT 6.1 (L) 10/09/2014   ALBUMIN 3.1 (L) 06/01/2017   ALBUMIN 3.1 (L) 10/09/2014   BILITOT 0.6 06/01/2017   BILITOT 0.5 10/09/2014   ALKPHOS 55 06/01/2017   ALKPHOS 67 10/09/2014  AST 22 06/01/2017   AST 28 10/09/2014   ALT 13 (L) 06/01/2017   ALT 19 10/09/2014  .  Micro Results No results found for this or any previous visit (from the past 240 hour(s)).      Code Status Orders        Start     Ordered   05/28/17 1732  Full code  Continuous     05/28/17  1732    Code Status History    Date Active Date Inactive Code Status Order ID Comments User Context   This patient has a current code status but no historical code status.    Advance Directive Documentation     Most Recent Value  Type of Advance Directive  Healthcare Power of Attorney  Pre-existing out of facility DNR order (yellow form or pink MOST form)  -  "MOST" Form in Place?  -          Follow-up Information    doctor at rehab Follow up in 1 day(s).           Discharge Medications   Allergies as of 06/11/2017      Reactions   Codeine Sulfate    Other reaction(s): Unknown   Diphenhydramine Other (See Comments)   skin became red all over   Ivp Dye [iodinated Diagnostic Agents] Other (See Comments)   Skin becomes red all over   Quinidine    Other reaction(s): Unknown Other reaction(s): Unknown      Medication List    STOP taking these medications   benazepril 20 MG tablet Commonly known as:  LOTENSIN     TAKE these medications   acetaminophen 325 MG tablet Commonly known as:  TYLENOL Take 650 mg by mouth every 4 (four) hours as needed for mild pain (or temp greater than 100.4).   ALPRAZolam 0.5 MG tablet Commonly known as:  XANAX Take 1 tablet (0.5 mg total) by mouth 2 (two) times daily as needed for anxiety.   CARTIA XT 300 MG 24 hr capsule Generic drug:  diltiazem take 1 capsule by mouth once daily   diltiazem 60 MG tablet Commonly known as:  CARDIZEM Take 120 mg by mouth daily as needed (for increased heart rate).   docusate sodium 100 MG capsule Commonly known as:  COLACE Take 1 capsule (100 mg total) by mouth 2 (two) times daily.   feeding supplement (ENSURE ENLIVE) Liqd Take 237 mLs by mouth 2 (two) times daily between meals.   furosemide 20 MG tablet Commonly known as:  LASIX Take 20 mg by mouth daily.   glucosamine-chondroitin 500-400 MG tablet Take 1 tablet by mouth 3 (three) times daily.   MULTI-VITAMINS Tabs Take 1 tablet by  mouth daily.   oxybutynin 5 MG 24 hr tablet Commonly known as:  DITROPAN-XL Take 1 tablet (5 mg total) by mouth at bedtime.   pantoprazole 40 MG tablet Commonly known as:  PROTONIX Take 1 tablet (40 mg total) by mouth 2 (two) times daily.   simvastatin 20 MG tablet Commonly known as:  ZOCOR Take 20 mg by mouth at bedtime.   temazepam 15 MG capsule Commonly known as:  RESTORIL Take 1 capsule (15 mg total) by mouth at bedtime as needed for sleep.   vitamin C 1000 MG tablet Take 500 mg by mouth daily.   warfarin 4 MG tablet Commonly known as:  COUMADIN take 1 tablet by mouth once daily  Discharge Care Instructions        Start     Ordered   06/06/17 0000  feeding supplement, ENSURE ENLIVE, (ENSURE ENLIVE) LIQD  2 times daily between meals     06/05/17 1414   06/05/17 0000  ALPRAZolam (XANAX) 0.5 MG tablet  2 times daily PRN     06/05/17 1414   06/05/17 0000  temazepam (RESTORIL) 15 MG capsule  At bedtime PRN     06/05/17 1414   06/05/17 0000  docusate sodium (COLACE) 100 MG capsule  2 times daily     06/05/17 1414   06/05/17 0000  oxybutynin (DITROPAN-XL) 5 MG 24 hr tablet  Daily at bedtime     06/05/17 1414   06/05/17 0000  pantoprazole (PROTONIX) 40 MG tablet  2 times daily     06/05/17 1414         Total Time in preparing paper work, data evaluation and todays exam - 35 minutes  Dustin Flock M.D on 06/11/2017 at 9:00 PM  [place]  [phone]

## 2017-06-11 NOTE — Progress Notes (Addendum)
OT Cancellation Note  Patient Details Name: Crystal Landry MRN: [id number] DOB: 1927/03/26   Cancelled Treatment:    Reason Eval/Treat Not Completed: Other (comment). Reviewed chart. Spoke with case manager who requested OT hold treatment session this date given pt's current situation with possible transfer to [facility] and TPN to be re-started today. Will continue to follow acutely and re-attempt OT treatment next date as appropriate.  Jeni Salles, MPH, MS, OTR/L ascom [phone] 06/11/17, 3:52 PM

## 2017-06-11 NOTE — Progress Notes (Signed)
Report called to [place].

## 2017-06-11 NOTE — Progress Notes (Signed)
[name] MD, MRCP(U.K) [address]  [place]  [place], [place] [address]  Main: [phone]    Crystal Landry is being followed for vomiting /duodenal obstruction   Subjective: No vomiting but has nausea, taking some orally.    Objective: Vital signs in last 24 hours: Vitals:   06/10/17 1951 06/11/17 0437 06/11/17 0500 06/11/17 1236  BP: (!) 125/57 (!) 104/57  (!) 113/49  Pulse: (!) 51 61  80  Resp: 19 18    Temp: 98.1 F (36.7 C) 98.3 F (36.8 C)  98.2 F (36.8 C)  TempSrc: Oral Oral  Oral  SpO2: 91% 95%  94%  Weight:   131 lb 3.2 oz (59.5 kg)   Height:       Weight change: 5 lb (2.268 kg)  Intake/Output Summary (Last 24 hours) at 06/11/17 1345 Last data filed at 06/10/17 2201  Gross per 24 hour  Intake              560 ml  Output              100 ml  Net              460 ml     Exam:  Heart:: Regular rate and rhythm, S1S2 present or without murmur or extra heart sounds Lungs: normal, clear to auscultation and clear to auscultation and percussion Abdomen: soft, nontender, normal bowel sounds   Lab Results: @LABTEST2 @ Micro Results: No results found for this or any previous visit (from the past 240 hour(s)). Studies/Results: Dg Chest Port 1 View  Result Date: 06/10/2017 CLINICAL DATA:  Line placement. EXAM: PORTABLE CHEST 1 VIEW COMPARISON:  May 31, 2017 FINDINGS: Right subclavian central venous line is identified with distal tip in superior vena cava. There is no pneumothorax. Patient status post prior median sternotomy. The aorta is tortuous. The heart size is enlarged. There is no focal infiltrate, pulmonary edema, or pleural effusion. The osseous structures are unremarkable. IMPRESSION: Right central venous line is identified with distal tip in superior vena cava. There is no pneumothorax. Electronically Signed   By: Abelardo Diesel M.D.   On: 06/10/2017 21:07   Dg Abd Acute W/chest  Result Date: 06/11/2017 CLINICAL DATA:  Atrial  fibrillation. Abdominal pain, nausea, vomiting. EXAM: DG ABDOMEN ACUTE W/ 1V CHEST COMPARISON:  06/10/2017 FINDINGS: Prior CABG. Heart is borderline in size. Lungs are clear. No effusions. Right PICC line tip is in the SVC, unchanged. Oral contrast material noted within the colon. Nonobstructive bowel gas pattern. No free air organomegaly. Degenerative changes and scoliosis in the lumbar spine. IMPRESSION: No evidence of bowel obstruction or free air.  No acute findings. No active cardiopulmonary disease. Electronically Signed   By: Rolm Baptise M.D.   On: 06/11/2017 09:50   Medications: I have reviewed the patient's current medications. Scheduled Meds: . Barium Sulfate  900 mL Oral Once  . diltiazem  300 mg Oral Daily  . diltiazem  60 mg Oral Q8H  . docusate sodium  100 mg Oral BID  . feeding supplement (ENSURE ENLIVE)  237 mL Oral BID BM  . oxybutynin  5 mg Oral QHS  . pantoprazole (PROTONIX) IV  40 mg Intravenous Q12H  . polyethylene glycol  17 g Oral Daily  . simethicone  80 mg Oral QID  . simvastatin  20 mg Oral q1800  . sodium chloride flush  10-40 mL Intracatheter Q12H  . sodium chloride flush  10-40 mL Intracatheter Q12H  .  sucralfate  1 g Oral TID WC & HS  . traZODone  25 mg Oral Once  . Warfarin - Pharmacist Dosing Inpatient   Does not apply q1800   Continuous Infusions: . dextrose 5% lactated ringers 50 mL/hr at 06/11/17 1237  . Marland KitchenTPN (CLINIMIX-E) Adult     And  . fat emulsion     PRN Meds:.acetaminophen **OR** acetaminophen, alum & mag hydroxide-simeth, lidocaine, LORazepam, metoprolol tartrate, ondansetron **OR** ondansetron (ZOFRAN) IV, sodium chloride flush, sodium chloride flush, temazepam   Assessment: Active Problems:   Atrial fibrillation with RVR (HCC)   Encounter for imaging study to confirm nasogastric (NG) tube placement   Abdominal pain  Crystal Landry a 83 y.o.y/o femaleadmitted with nausea, vomiting and diarrhea. CT scan concerning for duodenal  obstruction.EGD with severely edematous distal stomach of unclear etiology with a functional obstruction. Duodenal inflammation in 3rd portion of the duodenum with mild narrowing but no obstruction. No definite mass seen. Biopsies negative for malignancy /. Pain improved then recurred. CT scan showed recurrence of duodenal obstruction with no obvious mass. Repeat X ray shows the contrast in the colon suggesting that she has ongoing motility ,H pylori stool antigen is negative . [facility] didn't accept transfer . Not able to tolerate anything PO.  She is not interetested in any tube feeds andplan is to restart TPN.  I explained she can be on TPN and we can wait for the gastric and duodenal inflammation to resolve, . As an outpatient in probably 2 -3 weeks may benefit from a MR enterogram to evaluate the duodenal area to see if the inflammation has resolved. Other options to consider would be a duodenal stent as an outpatient . Son is keen to move her to [facility] and there has been discussion with the transfer center.   I have discussed the plan with Dr Posey Pronto     LOS: 14 days   [name] 06/11/2017, 1:45 PM

## 2017-06-11 NOTE — Progress Notes (Signed)
Pt attempted to eat some dinner but because nauseated and vomited. IV zofran given. TPN & fat arrived to floor at this time as well and was initiated.

## 2017-06-11 NOTE — Plan of Care (Signed)
Problem: Education: Goal: Knowledge of [facility] Education information/materials will improve Outcome: Completed/Met Date Met: 06/11/17 Pt got PICC line placed in the right upper arm, Chest xray confirmed placement, and line has been used. No complaints of pain or nausea, pt did drink some grape juice and some ginger ale this shift, up to [facility] with standby assist.

## 2017-06-11 NOTE — Progress Notes (Addendum)
[place] NOTE   Pharmacy Consult for TPN Indication: endometrial cancer duodenal obstruction, enteral feeds refused.  Patient Measurements: Height: 5' 2.5" (158.8 cm) Weight: 131 lb 3.2 oz (59.5 kg) IBW/kg (Calculated) : 51.25 TPN AdjBW (KG): 60.3 Body mass index is 23.61 kg/m.  Assessment:   GI: duodenal obstruction Endo:  Insulin requirements in the past 24 hours: 0 units Lytes: Lab Results  Component Value Date   CREATININE 0.76 06/04/2017   BUN 22 (H) 06/04/2017   NA 141 06/04/2017   K 4.3 06/04/2017   CL 109 06/04/2017   CO2 27 06/04/2017    Renal:Estimated Creatinine Clearance: 40.1 mL/min (by C-G formula based on SCr of 0.76 mg/dL).  Pulm: Cards: Neuro: Hepatobil: Bilirubin     Component Value Date/Time   BILITOT 0.6 06/01/2017 0512   BILITOT 0.5 10/09/2014 0511    ID:  Best Practices: TPN Access: PICC line placed 9/13 TPN start date: 9/3@1800   Nutritional Goals (per RD recommendation on 9/13): kCal:Provides 1042 calories, 48 grams of protein, 986mL total volume  Current Nutrition:   Plan:  9/13@1800  Clinimix 5/15e at 36ml/hr with MVI and Trace Elements  9/13 20% lipid emulsion at 69ml/hr x 12hr 9/13 IVMF (D5) at 50 ml/hr x 6 hrs until TPN starts  This provides 48 g of protein and 1042 kCals per day meeting 62% of protein and 71% of kCal needs Goal: Provides 1468 calories, 78 grams of protein, and 15102mL total volume  9/13 MVI and trace elements in TPN Monitor TPN labs, 9/14 F/U 9/14 with BMP by following pharmacist   9/13@1516  call from RD, ordering baseline Mg, Phos, and Potassium. RD will monitor electrolytes moving forward as well.  Thomasenia Sales, PharmD, MBA, [employer] Clinical Pharmacist [facility]   06/11/2017,12:42 PM

## 2017-06-11 NOTE — Care Management (Signed)
There is discussion on transfer to [facility] but this is not final.  Patient has not been officially accept for transfer.  [facility] representative from [place] will speak with Crystal Landry today to explain this level of care and potential benefits to the plan of care. [facility] would not be able to make any offer as patient has not had ICU days.

## 2017-06-22 NOTE — Progress Notes (Signed)
Subjective:  Still resting comfortably still having mild abdominal pain continue low-dose suction denies any chest pain still had some palpitations respiratory  Objective:  Vital Signs in the last 24 hours:    Intake/Output from previous day: No intake/output data recorded. Intake/Output from this shift: No intake/output data recorded.  Physical Exam: General appearance: appears stated age Neck: no adenopathy, no carotid bruit, no JVD, supple, symmetrical, trachea midline and thyroid not enlarged, symmetric, no tenderness/mass/nodules Lungs: clear to auscultation bilaterally Heart: irregularly irregular rhythm Extremities: extremities normal, atraumatic, no cyanosis or edema Pulses: 2+ and symmetric Skin: Skin color, texture, turgor normal. No rashes or lesions Neurologic: Alert and oriented X 3, normal strength and tone. Normal symmetric reflexes. Normal coordination and gait Abdominal exam is mildly distended with mild tenderness diffusely  Lab Results: No results for input(s): WBC, HGB, PLT in the last 72 hours. No results for input(s): NA, K, CL, CO2, GLUCOSE, BUN, CREATININE in the last 72 hours. No results for input(s): TROPONINI in the last 72 hours.  Invalid input(s): CK, MB Hepatic Function Panel No results for input(s): PROT, ALBUMIN, AST, ALT, ALKPHOS, BILITOT, BILIDIR, IBILI in the last 72 hours. No results for input(s): CHOL in the last 72 hours. No results for input(s): PROTIME in the last 72 hours.  Imaging: Imaging results have been reviewed  Cardiac Studies:  Assessment/Plan:  Atrial Fibrillation  Possible bowel obstruction Anxiety Endometrial cancer Depression Hypertension Chronic renal insufficiency Hyperlipidemia . Plan Recommend continue rate control greeley with conservative MG suction for small bowel obstruction Have the patient follow-up with nephrology for renal insufficiency Hold Coumadin for now switched to Lovenox Consider urology for  hydronephrosis. Continue atrial fibrillation therapy for now no indication for cardioversion  LOS: 14 days    Chaston Bradburn D Troyce Febo 06/22/2017, 1:39 PM

## 2017-06-22 NOTE — Progress Notes (Signed)
Subjective:  Resting comfortably in bed.still having some abdominal discom  Objective:  Vital Signs in the last 24 hours:    Intake/Output from previous day: No intake/output data recorded. Intake/Output from this shift: No intake/output data recorded.  Physical Exam: General appearance: appears stated age Neck: no adenopathy, no carotid bruit, no JVD, supple, symmetrical, trachea midline and thyroid not enlarged, symmetric, no tenderness/mass/nodules Lungs: clear to auscultation bilaterally Heart: irregularly irregular rhythm Abdomen: soft, non-tender; bowel sounds normal; no masses,  no organomegaly Extremities: extremities normal, atraumatic, no cyanosis or edema Pulses: 2+ and symmetric Skin: Skin color, texture, turgor normal. No rashes or lesions Neurologic: Alert and oriented X 3, normal strength and tone. Normal symmetric reflexes. Normal coordination and gait Patient has mild abdominal discomfort  Lab Results: No results for input(s): WBC, HGB, PLT in the last 72 hours. No results for input(s): NA, K, CL, CO2, GLUCOSE, BUN, CREATININE in the last 72 hours. No results for input(s): TROPONINI in the last 72 hours.  Invalid input(s): CK, MB Hepatic Function Panel No results for input(s): PROT, ALBUMIN, AST, ALT, ALKPHOS, BILITOT, BILIDIR, IBILI in the last 72 hours. No results for input(s): CHOL in the last 72 hours. No results for input(s): PROTIME in the last 72 hours.  Imaging: Imaging results have been reviewed  Cardiac Studies:  Assessment/Plan:  Atrial Fibrillation Chest Pain Edema Hypotension/Shock Palpitations Shortness of Breath  Bowel obstruction Endometrial cancer Depression Hypertension . Plan Recommend rate control for atrial fibrillation Currently on Coumadin for anticoagulation Agree with GI evaluation with bowel rest Continue low-dose suction Diltiazem for rate control Continue Zocor therapy for hyperlipidemia Recommend nephrology input  from renal insufficiency Continue conservative surgical input  LOS: 14 days    Crystal Landry Crystal Landry 06/22/2017, 1:32 PM

## 2017-07-30 DEATH — deceased

## 2017-08-26 ENCOUNTER — Ambulatory Visit: Payer: Self-pay | Admitting: Radiation Oncology

## 2018-03-03 ENCOUNTER — Ambulatory Visit: Payer: Medicare Other
